# Patient Record
Sex: Male | Born: 1945 | Race: White | Hispanic: No | Marital: Married | Smoking: Current every day smoker
Health system: Southern US, Community
[De-identification: ages and names within clinical notes are randomized; demographics above are authoritative.]

## PROBLEM LIST (undated history)

## (undated) DIAGNOSIS — I219 Acute myocardial infarction, unspecified: Secondary | ICD-10-CM

## (undated) DIAGNOSIS — M1990 Unspecified osteoarthritis, unspecified site: Secondary | ICD-10-CM

## (undated) DIAGNOSIS — E785 Hyperlipidemia, unspecified: Secondary | ICD-10-CM

## (undated) DIAGNOSIS — R1310 Dysphagia, unspecified: Secondary | ICD-10-CM

## (undated) DIAGNOSIS — F32A Depression, unspecified: Secondary | ICD-10-CM

## (undated) DIAGNOSIS — I2510 Atherosclerotic heart disease of native coronary artery without angina pectoris: Secondary | ICD-10-CM

## (undated) DIAGNOSIS — IMO0001 Reserved for inherently not codable concepts without codable children: Secondary | ICD-10-CM

## (undated) DIAGNOSIS — I10 Essential (primary) hypertension: Secondary | ICD-10-CM

## (undated) DIAGNOSIS — J449 Chronic obstructive pulmonary disease, unspecified: Secondary | ICD-10-CM

## (undated) DIAGNOSIS — F419 Anxiety disorder, unspecified: Secondary | ICD-10-CM

## (undated) DIAGNOSIS — R002 Palpitations: Secondary | ICD-10-CM

## (undated) DIAGNOSIS — Z794 Long term (current) use of insulin: Secondary | ICD-10-CM

## (undated) DIAGNOSIS — K219 Gastro-esophageal reflux disease without esophagitis: Secondary | ICD-10-CM

## (undated) DIAGNOSIS — S32020A Wedge compression fracture of second lumbar vertebra, initial encounter for closed fracture: Secondary | ICD-10-CM

## (undated) DIAGNOSIS — F329 Major depressive disorder, single episode, unspecified: Secondary | ICD-10-CM

## (undated) DIAGNOSIS — K8020 Calculus of gallbladder without cholecystitis without obstruction: Secondary | ICD-10-CM

## (undated) DIAGNOSIS — E119 Type 2 diabetes mellitus without complications: Secondary | ICD-10-CM

## (undated) DIAGNOSIS — I700 Atherosclerosis of aorta: Secondary | ICD-10-CM

## (undated) DIAGNOSIS — J189 Pneumonia, unspecified organism: Secondary | ICD-10-CM

## (undated) HISTORY — DX: Reserved for inherently not codable concepts without codable children: IMO0001

## (undated) HISTORY — PX: CARDIAC CATHETERIZATION: SHX172

## (undated) HISTORY — DX: Gastro-esophageal reflux disease without esophagitis: K21.9

## (undated) HISTORY — DX: Hyperlipidemia, unspecified: E78.5

## (undated) HISTORY — DX: Anxiety disorder, unspecified: F41.9

## (undated) HISTORY — DX: Essential (primary) hypertension: I10

## (undated) HISTORY — DX: Atherosclerosis of aorta: I70.0

## (undated) HISTORY — DX: Depression, unspecified: F32.A

## (undated) HISTORY — DX: Long term (current) use of insulin: Z79.4

## (undated) HISTORY — PX: CORONARY ANGIOPLASTY WITH STENT PLACEMENT: SHX49

## (undated) HISTORY — DX: Major depressive disorder, single episode, unspecified: F32.9

## (undated) HISTORY — DX: Palpitations: R00.2

## (undated) HISTORY — DX: Type 2 diabetes mellitus without complications: E11.9

---

## 1996-05-09 HISTORY — PX: CORONARY ANGIOPLASTY: SHX604

## 2006-01-17 DIAGNOSIS — R319 Hematuria, unspecified: Secondary | ICD-10-CM | POA: Insufficient documentation

## 2006-01-17 DIAGNOSIS — E785 Hyperlipidemia, unspecified: Secondary | ICD-10-CM | POA: Insufficient documentation

## 2006-01-17 DIAGNOSIS — I2510 Atherosclerotic heart disease of native coronary artery without angina pectoris: Secondary | ICD-10-CM | POA: Insufficient documentation

## 2006-01-17 DIAGNOSIS — I10 Essential (primary) hypertension: Secondary | ICD-10-CM | POA: Insufficient documentation

## 2006-01-17 DIAGNOSIS — R002 Palpitations: Secondary | ICD-10-CM | POA: Insufficient documentation

## 2010-06-04 DIAGNOSIS — Z7189 Other specified counseling: Secondary | ICD-10-CM | POA: Insufficient documentation

## 2010-06-29 DIAGNOSIS — Z955 Presence of coronary angioplasty implant and graft: Secondary | ICD-10-CM | POA: Insufficient documentation

## 2010-06-29 DIAGNOSIS — I252 Old myocardial infarction: Secondary | ICD-10-CM | POA: Insufficient documentation

## 2010-07-27 DIAGNOSIS — F419 Anxiety disorder, unspecified: Secondary | ICD-10-CM | POA: Insufficient documentation

## 2011-03-28 DIAGNOSIS — I249 Acute ischemic heart disease, unspecified: Secondary | ICD-10-CM | POA: Insufficient documentation

## 2011-04-01 DIAGNOSIS — Z7189 Other specified counseling: Secondary | ICD-10-CM | POA: Insufficient documentation

## 2011-05-18 DIAGNOSIS — R1310 Dysphagia, unspecified: Secondary | ICD-10-CM | POA: Diagnosis not present

## 2011-07-08 DIAGNOSIS — Z7189 Other specified counseling: Secondary | ICD-10-CM | POA: Diagnosis not present

## 2011-07-26 DIAGNOSIS — M25569 Pain in unspecified knee: Secondary | ICD-10-CM | POA: Diagnosis not present

## 2011-07-28 DIAGNOSIS — E785 Hyperlipidemia, unspecified: Secondary | ICD-10-CM | POA: Diagnosis not present

## 2011-07-28 DIAGNOSIS — E119 Type 2 diabetes mellitus without complications: Secondary | ICD-10-CM | POA: Diagnosis not present

## 2011-07-28 DIAGNOSIS — I10 Essential (primary) hypertension: Secondary | ICD-10-CM | POA: Diagnosis not present

## 2011-08-02 DIAGNOSIS — R059 Cough, unspecified: Secondary | ICD-10-CM | POA: Diagnosis not present

## 2011-08-02 DIAGNOSIS — I10 Essential (primary) hypertension: Secondary | ICD-10-CM | POA: Diagnosis not present

## 2011-08-02 DIAGNOSIS — J320 Chronic maxillary sinusitis: Secondary | ICD-10-CM | POA: Diagnosis not present

## 2011-08-02 DIAGNOSIS — E119 Type 2 diabetes mellitus without complications: Secondary | ICD-10-CM | POA: Diagnosis not present

## 2011-08-02 DIAGNOSIS — F411 Generalized anxiety disorder: Secondary | ICD-10-CM | POA: Diagnosis not present

## 2011-08-02 DIAGNOSIS — E785 Hyperlipidemia, unspecified: Secondary | ICD-10-CM | POA: Diagnosis not present

## 2011-08-02 DIAGNOSIS — R05 Cough: Secondary | ICD-10-CM | POA: Diagnosis not present

## 2011-08-08 DIAGNOSIS — Z7189 Other specified counseling: Secondary | ICD-10-CM | POA: Diagnosis not present

## 2011-09-01 DIAGNOSIS — R002 Palpitations: Secondary | ICD-10-CM | POA: Diagnosis not present

## 2011-09-01 DIAGNOSIS — E785 Hyperlipidemia, unspecified: Secondary | ICD-10-CM | POA: Diagnosis not present

## 2011-09-01 DIAGNOSIS — I2510 Atherosclerotic heart disease of native coronary artery without angina pectoris: Secondary | ICD-10-CM | POA: Diagnosis not present

## 2011-09-01 DIAGNOSIS — I2119 ST elevation (STEMI) myocardial infarction involving other coronary artery of inferior wall: Secondary | ICD-10-CM | POA: Diagnosis not present

## 2011-09-07 DIAGNOSIS — Z7189 Other specified counseling: Secondary | ICD-10-CM | POA: Diagnosis not present

## 2011-09-19 DIAGNOSIS — I2510 Atherosclerotic heart disease of native coronary artery without angina pectoris: Secondary | ICD-10-CM | POA: Diagnosis not present

## 2011-10-08 DIAGNOSIS — Z7189 Other specified counseling: Secondary | ICD-10-CM | POA: Diagnosis not present

## 2011-12-06 DIAGNOSIS — E119 Type 2 diabetes mellitus without complications: Secondary | ICD-10-CM | POA: Diagnosis not present

## 2011-12-07 DIAGNOSIS — E119 Type 2 diabetes mellitus without complications: Secondary | ICD-10-CM | POA: Diagnosis not present

## 2011-12-07 DIAGNOSIS — J320 Chronic maxillary sinusitis: Secondary | ICD-10-CM | POA: Diagnosis not present

## 2012-01-08 DIAGNOSIS — Z7189 Other specified counseling: Secondary | ICD-10-CM | POA: Diagnosis not present

## 2012-02-14 DIAGNOSIS — R002 Palpitations: Secondary | ICD-10-CM | POA: Diagnosis not present

## 2012-02-14 DIAGNOSIS — I2119 ST elevation (STEMI) myocardial infarction involving other coronary artery of inferior wall: Secondary | ICD-10-CM | POA: Diagnosis not present

## 2012-02-14 DIAGNOSIS — I2510 Atherosclerotic heart disease of native coronary artery without angina pectoris: Secondary | ICD-10-CM | POA: Diagnosis not present

## 2012-02-14 DIAGNOSIS — E785 Hyperlipidemia, unspecified: Secondary | ICD-10-CM | POA: Diagnosis not present

## 2012-02-16 DIAGNOSIS — R5381 Other malaise: Secondary | ICD-10-CM | POA: Diagnosis not present

## 2012-02-16 DIAGNOSIS — R5383 Other fatigue: Secondary | ICD-10-CM | POA: Diagnosis not present

## 2012-03-06 DIAGNOSIS — E785 Hyperlipidemia, unspecified: Secondary | ICD-10-CM | POA: Diagnosis not present

## 2012-03-06 DIAGNOSIS — R059 Cough, unspecified: Secondary | ICD-10-CM | POA: Diagnosis not present

## 2012-03-06 DIAGNOSIS — E119 Type 2 diabetes mellitus without complications: Secondary | ICD-10-CM | POA: Diagnosis not present

## 2012-03-06 DIAGNOSIS — R05 Cough: Secondary | ICD-10-CM | POA: Diagnosis not present

## 2012-03-09 DIAGNOSIS — R05 Cough: Secondary | ICD-10-CM | POA: Diagnosis not present

## 2012-03-09 DIAGNOSIS — F4321 Adjustment disorder with depressed mood: Secondary | ICD-10-CM | POA: Diagnosis not present

## 2012-03-09 DIAGNOSIS — E785 Hyperlipidemia, unspecified: Secondary | ICD-10-CM | POA: Diagnosis not present

## 2012-03-09 DIAGNOSIS — E119 Type 2 diabetes mellitus without complications: Secondary | ICD-10-CM | POA: Diagnosis not present

## 2012-03-09 DIAGNOSIS — I10 Essential (primary) hypertension: Secondary | ICD-10-CM | POA: Diagnosis not present

## 2012-03-09 DIAGNOSIS — R059 Cough, unspecified: Secondary | ICD-10-CM | POA: Diagnosis not present

## 2012-04-04 DIAGNOSIS — R05 Cough: Secondary | ICD-10-CM | POA: Diagnosis not present

## 2012-04-04 DIAGNOSIS — R059 Cough, unspecified: Secondary | ICD-10-CM | POA: Diagnosis not present

## 2012-04-16 DIAGNOSIS — I252 Old myocardial infarction: Secondary | ICD-10-CM | POA: Diagnosis not present

## 2012-04-16 DIAGNOSIS — I10 Essential (primary) hypertension: Secondary | ICD-10-CM | POA: Diagnosis not present

## 2012-04-16 DIAGNOSIS — E785 Hyperlipidemia, unspecified: Secondary | ICD-10-CM | POA: Diagnosis not present

## 2012-04-16 DIAGNOSIS — I2510 Atherosclerotic heart disease of native coronary artery without angina pectoris: Secondary | ICD-10-CM | POA: Diagnosis not present

## 2012-04-18 DIAGNOSIS — I10 Essential (primary) hypertension: Secondary | ICD-10-CM | POA: Diagnosis not present

## 2012-04-18 DIAGNOSIS — R05 Cough: Secondary | ICD-10-CM | POA: Diagnosis not present

## 2012-04-18 DIAGNOSIS — R059 Cough, unspecified: Secondary | ICD-10-CM | POA: Diagnosis not present

## 2012-04-24 DIAGNOSIS — J438 Other emphysema: Secondary | ICD-10-CM | POA: Diagnosis not present

## 2012-04-24 DIAGNOSIS — F17200 Nicotine dependence, unspecified, uncomplicated: Secondary | ICD-10-CM | POA: Diagnosis not present

## 2012-04-24 DIAGNOSIS — I10 Essential (primary) hypertension: Secondary | ICD-10-CM | POA: Diagnosis not present

## 2012-04-24 DIAGNOSIS — Z9861 Coronary angioplasty status: Secondary | ICD-10-CM | POA: Diagnosis not present

## 2012-04-24 DIAGNOSIS — K219 Gastro-esophageal reflux disease without esophagitis: Secondary | ICD-10-CM | POA: Diagnosis not present

## 2012-04-24 DIAGNOSIS — E119 Type 2 diabetes mellitus without complications: Secondary | ICD-10-CM | POA: Diagnosis not present

## 2012-04-24 DIAGNOSIS — R079 Chest pain, unspecified: Secondary | ICD-10-CM | POA: Diagnosis not present

## 2012-04-24 DIAGNOSIS — R110 Nausea: Secondary | ICD-10-CM | POA: Diagnosis not present

## 2012-04-24 DIAGNOSIS — R05 Cough: Secondary | ICD-10-CM | POA: Diagnosis not present

## 2012-04-24 DIAGNOSIS — Z7901 Long term (current) use of anticoagulants: Secondary | ICD-10-CM | POA: Diagnosis not present

## 2012-04-24 DIAGNOSIS — R059 Cough, unspecified: Secondary | ICD-10-CM | POA: Diagnosis not present

## 2012-04-24 DIAGNOSIS — R0602 Shortness of breath: Secondary | ICD-10-CM | POA: Diagnosis not present

## 2012-04-25 DIAGNOSIS — E785 Hyperlipidemia, unspecified: Secondary | ICD-10-CM | POA: Diagnosis present

## 2012-04-25 DIAGNOSIS — K219 Gastro-esophageal reflux disease without esophagitis: Secondary | ICD-10-CM | POA: Diagnosis present

## 2012-04-25 DIAGNOSIS — R0602 Shortness of breath: Secondary | ICD-10-CM | POA: Diagnosis not present

## 2012-04-25 DIAGNOSIS — J449 Chronic obstructive pulmonary disease, unspecified: Secondary | ICD-10-CM | POA: Diagnosis present

## 2012-04-25 DIAGNOSIS — F17200 Nicotine dependence, unspecified, uncomplicated: Secondary | ICD-10-CM | POA: Diagnosis present

## 2012-04-25 DIAGNOSIS — R079 Chest pain, unspecified: Secondary | ICD-10-CM | POA: Diagnosis not present

## 2012-04-25 DIAGNOSIS — E119 Type 2 diabetes mellitus without complications: Secondary | ICD-10-CM | POA: Diagnosis present

## 2012-04-25 DIAGNOSIS — I10 Essential (primary) hypertension: Secondary | ICD-10-CM | POA: Diagnosis present

## 2012-04-25 DIAGNOSIS — J069 Acute upper respiratory infection, unspecified: Secondary | ICD-10-CM | POA: Diagnosis present

## 2012-04-25 DIAGNOSIS — R05 Cough: Secondary | ICD-10-CM | POA: Diagnosis not present

## 2012-04-25 DIAGNOSIS — I2510 Atherosclerotic heart disease of native coronary artery without angina pectoris: Secondary | ICD-10-CM | POA: Diagnosis not present

## 2012-04-25 DIAGNOSIS — R059 Cough, unspecified: Secondary | ICD-10-CM | POA: Diagnosis not present

## 2012-04-25 DIAGNOSIS — I252 Old myocardial infarction: Secondary | ICD-10-CM | POA: Diagnosis not present

## 2012-04-25 DIAGNOSIS — Z9861 Coronary angioplasty status: Secondary | ICD-10-CM | POA: Diagnosis not present

## 2012-04-25 DIAGNOSIS — E669 Obesity, unspecified: Secondary | ICD-10-CM | POA: Diagnosis present

## 2012-04-25 DIAGNOSIS — I209 Angina pectoris, unspecified: Secondary | ICD-10-CM | POA: Diagnosis not present

## 2012-04-25 DIAGNOSIS — I200 Unstable angina: Secondary | ICD-10-CM | POA: Diagnosis not present

## 2012-04-25 DIAGNOSIS — R110 Nausea: Secondary | ICD-10-CM | POA: Diagnosis not present

## 2012-04-26 DIAGNOSIS — I2510 Atherosclerotic heart disease of native coronary artery without angina pectoris: Secondary | ICD-10-CM | POA: Insufficient documentation

## 2012-04-30 DIAGNOSIS — J438 Other emphysema: Secondary | ICD-10-CM | POA: Diagnosis not present

## 2012-04-30 DIAGNOSIS — Z9861 Coronary angioplasty status: Secondary | ICD-10-CM | POA: Diagnosis not present

## 2012-04-30 DIAGNOSIS — E119 Type 2 diabetes mellitus without complications: Secondary | ICD-10-CM | POA: Diagnosis not present

## 2012-06-05 DIAGNOSIS — R002 Palpitations: Secondary | ICD-10-CM | POA: Diagnosis not present

## 2012-06-05 DIAGNOSIS — I2119 ST elevation (STEMI) myocardial infarction involving other coronary artery of inferior wall: Secondary | ICD-10-CM | POA: Diagnosis not present

## 2012-06-05 DIAGNOSIS — I2510 Atherosclerotic heart disease of native coronary artery without angina pectoris: Secondary | ICD-10-CM | POA: Diagnosis not present

## 2012-06-05 DIAGNOSIS — E785 Hyperlipidemia, unspecified: Secondary | ICD-10-CM | POA: Diagnosis not present

## 2012-08-02 DIAGNOSIS — I2510 Atherosclerotic heart disease of native coronary artery without angina pectoris: Secondary | ICD-10-CM | POA: Diagnosis not present

## 2012-08-02 DIAGNOSIS — J069 Acute upper respiratory infection, unspecified: Secondary | ICD-10-CM | POA: Diagnosis not present

## 2012-08-02 DIAGNOSIS — R079 Chest pain, unspecified: Secondary | ICD-10-CM | POA: Diagnosis not present

## 2012-08-02 DIAGNOSIS — Z95818 Presence of other cardiac implants and grafts: Secondary | ICD-10-CM | POA: Diagnosis not present

## 2012-08-02 DIAGNOSIS — E785 Hyperlipidemia, unspecified: Secondary | ICD-10-CM | POA: Diagnosis not present

## 2012-08-02 DIAGNOSIS — E119 Type 2 diabetes mellitus without complications: Secondary | ICD-10-CM | POA: Diagnosis not present

## 2012-08-02 DIAGNOSIS — R0602 Shortness of breath: Secondary | ICD-10-CM | POA: Diagnosis not present

## 2012-08-02 DIAGNOSIS — I10 Essential (primary) hypertension: Secondary | ICD-10-CM | POA: Diagnosis not present

## 2012-08-02 DIAGNOSIS — R0789 Other chest pain: Secondary | ICD-10-CM | POA: Diagnosis not present

## 2012-08-03 DIAGNOSIS — R0789 Other chest pain: Secondary | ICD-10-CM | POA: Diagnosis not present

## 2012-08-03 DIAGNOSIS — E119 Type 2 diabetes mellitus without complications: Secondary | ICD-10-CM | POA: Diagnosis not present

## 2012-08-03 DIAGNOSIS — I2510 Atherosclerotic heart disease of native coronary artery without angina pectoris: Secondary | ICD-10-CM | POA: Diagnosis not present

## 2012-08-03 DIAGNOSIS — R079 Chest pain, unspecified: Secondary | ICD-10-CM | POA: Insufficient documentation

## 2012-08-03 DIAGNOSIS — I10 Essential (primary) hypertension: Secondary | ICD-10-CM | POA: Diagnosis not present

## 2012-08-03 DIAGNOSIS — J069 Acute upper respiratory infection, unspecified: Secondary | ICD-10-CM | POA: Diagnosis not present

## 2012-08-07 DIAGNOSIS — I2510 Atherosclerotic heart disease of native coronary artery without angina pectoris: Secondary | ICD-10-CM | POA: Diagnosis not present

## 2012-08-07 DIAGNOSIS — I10 Essential (primary) hypertension: Secondary | ICD-10-CM | POA: Diagnosis not present

## 2012-08-07 DIAGNOSIS — Z9861 Coronary angioplasty status: Secondary | ICD-10-CM | POA: Diagnosis not present

## 2012-08-13 DIAGNOSIS — E785 Hyperlipidemia, unspecified: Secondary | ICD-10-CM | POA: Diagnosis not present

## 2012-08-13 DIAGNOSIS — I2510 Atherosclerotic heart disease of native coronary artery without angina pectoris: Secondary | ICD-10-CM | POA: Diagnosis not present

## 2012-08-13 DIAGNOSIS — I10 Essential (primary) hypertension: Secondary | ICD-10-CM | POA: Diagnosis not present

## 2012-08-23 DIAGNOSIS — I2510 Atherosclerotic heart disease of native coronary artery without angina pectoris: Secondary | ICD-10-CM | POA: Diagnosis not present

## 2012-08-23 DIAGNOSIS — I517 Cardiomegaly: Secondary | ICD-10-CM | POA: Diagnosis not present

## 2012-09-04 DIAGNOSIS — E785 Hyperlipidemia, unspecified: Secondary | ICD-10-CM | POA: Diagnosis not present

## 2012-09-04 DIAGNOSIS — I2510 Atherosclerotic heart disease of native coronary artery without angina pectoris: Secondary | ICD-10-CM | POA: Diagnosis not present

## 2012-09-04 DIAGNOSIS — F17200 Nicotine dependence, unspecified, uncomplicated: Secondary | ICD-10-CM | POA: Diagnosis not present

## 2012-09-25 DIAGNOSIS — E119 Type 2 diabetes mellitus without complications: Secondary | ICD-10-CM | POA: Diagnosis not present

## 2012-10-03 DIAGNOSIS — E785 Hyperlipidemia, unspecified: Secondary | ICD-10-CM | POA: Diagnosis not present

## 2012-10-03 DIAGNOSIS — I10 Essential (primary) hypertension: Secondary | ICD-10-CM | POA: Diagnosis not present

## 2012-10-03 DIAGNOSIS — E119 Type 2 diabetes mellitus without complications: Secondary | ICD-10-CM | POA: Diagnosis not present

## 2012-10-10 DIAGNOSIS — I2510 Atherosclerotic heart disease of native coronary artery without angina pectoris: Secondary | ICD-10-CM | POA: Diagnosis not present

## 2012-10-10 DIAGNOSIS — I10 Essential (primary) hypertension: Secondary | ICD-10-CM | POA: Diagnosis not present

## 2012-10-10 DIAGNOSIS — F17200 Nicotine dependence, unspecified, uncomplicated: Secondary | ICD-10-CM | POA: Diagnosis not present

## 2012-10-10 DIAGNOSIS — E785 Hyperlipidemia, unspecified: Secondary | ICD-10-CM | POA: Diagnosis not present

## 2012-10-29 DIAGNOSIS — H5210 Myopia, unspecified eye: Secondary | ICD-10-CM | POA: Diagnosis not present

## 2012-10-29 DIAGNOSIS — H259 Unspecified age-related cataract: Secondary | ICD-10-CM | POA: Diagnosis not present

## 2012-10-29 DIAGNOSIS — H524 Presbyopia: Secondary | ICD-10-CM | POA: Diagnosis not present

## 2012-10-29 DIAGNOSIS — E109 Type 1 diabetes mellitus without complications: Secondary | ICD-10-CM | POA: Diagnosis not present

## 2012-12-07 DIAGNOSIS — E119 Type 2 diabetes mellitus without complications: Secondary | ICD-10-CM | POA: Diagnosis not present

## 2012-12-14 DIAGNOSIS — E119 Type 2 diabetes mellitus without complications: Secondary | ICD-10-CM | POA: Diagnosis not present

## 2013-01-30 ENCOUNTER — Encounter: Payer: Self-pay | Admitting: Cardiology

## 2013-01-30 ENCOUNTER — Ambulatory Visit (INDEPENDENT_AMBULATORY_CARE_PROVIDER_SITE_OTHER): Payer: Medicare Other | Admitting: Cardiology

## 2013-01-30 VITALS — BP 98/62 | HR 62 | Ht 70.0 in | Wt 277.6 lb

## 2013-01-30 DIAGNOSIS — IMO0001 Reserved for inherently not codable concepts without codable children: Secondary | ICD-10-CM

## 2013-01-30 DIAGNOSIS — I25810 Atherosclerosis of coronary artery bypass graft(s) without angina pectoris: Secondary | ICD-10-CM | POA: Diagnosis not present

## 2013-01-30 DIAGNOSIS — E785 Hyperlipidemia, unspecified: Secondary | ICD-10-CM | POA: Diagnosis not present

## 2013-01-30 DIAGNOSIS — F17200 Nicotine dependence, unspecified, uncomplicated: Secondary | ICD-10-CM

## 2013-01-30 DIAGNOSIS — Z794 Long term (current) use of insulin: Secondary | ICD-10-CM

## 2013-01-30 DIAGNOSIS — I10 Essential (primary) hypertension: Secondary | ICD-10-CM | POA: Diagnosis not present

## 2013-01-30 DIAGNOSIS — Z760 Encounter for issue of repeat prescription: Secondary | ICD-10-CM

## 2013-01-30 DIAGNOSIS — E119 Type 2 diabetes mellitus without complications: Secondary | ICD-10-CM

## 2013-01-30 DIAGNOSIS — I2510 Atherosclerotic heart disease of native coronary artery without angina pectoris: Secondary | ICD-10-CM | POA: Insufficient documentation

## 2013-01-30 MED ORDER — INSULIN GLARGINE 100 UNIT/ML ~~LOC~~ SOLN: 14.0000 [IU] | Freq: Every day | SUBCUTANEOUS | Status: DC

## 2013-01-30 MED ORDER — CLONAZEPAM 0.5 MG PO TABS: 0.5000 mg | ORAL_TABLET | Freq: Two times a day (BID) | ORAL | Status: DC | PRN

## 2013-01-30 NOTE — Progress Notes (Signed)
Patient ID: Dwayne [name], male   DOB: 28-Sep-1945, 67 y.o.   MRN: [id number]    Patient Name: Alford Gamero Date of Encounter: 01/30/2013  Primary Care Provider:  No primary provider on file. Primary Cardiologist:  Tobias Alexander, H  Patient Profile  Establishing cardiology care after move from [place]  Problem List   Past Medical History  Diagnosis Date  . Palpitations   . Hypertension     Benign  . Hyperlipidemia   . Reflux   . Diabetes mellitus without complication     Type 2  . Depression   . 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 Anxiety    Past Surgical History  Procedure Laterality Date  . Angioplasty    . Carotid stent insertion      Allergies  No Known Allergies  HPI  67 year old male with h/o IDDM, obesity, hypertension, hyperlipidemia, ongoing smoking and CAD who just moved to [facility] and is here to establish cardiology care. 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 He was doing well until 2012 when he had another episode chest pain and received stent to RCA another the next day for in stent restenosis. He was sterted on Prasugrel after that. He describes his pain as reflux but stronger. He states that since the last stent he has been asymptomatic. He is trying to exercise daily and has CP or SOB.    Home Medications  Prior to Admission medications   Medication Sig Start Date End Date Taking? Authorizing Provider  albuterol (PROVENTIL) (2.5 MG/3ML) 0.083% nebulizer solution Take 2.5 mg by nebulization every 6 (six) hours as needed for wheezing.   Yes Historical Provider, MD  atorvastatin (LIPITOR) 80 MG tablet Take 80 mg by mouth daily.   Yes Historical Provider, MD  clonazePAM (KLONOPIN) 0.5 MG tablet Take 0.5 mg by mouth 2 (two) times daily as needed for anxiety.   Yes Historical Provider, MD  GLIPIZIDE ER PO Take 10 mg by mouth 2 (two) times daily.   Yes Historical Provider, MD    hydrochlorothiazide (HYDRODIURIL) 25 MG tablet Take 25 mg by mouth daily.   Yes Historical Provider, MD  Insulin Glargine (LANTUS [place]) Inject 14 Units into the skin daily.   Yes Historical Provider, MD  lisinopril (PRINIVIL,ZESTRIL) 40 MG tablet Take 40 mg by mouth daily.   Yes Historical Provider, MD  metFORMIN (GLUCOPHAGE) 1000 MG tablet Take 1,000 mg by mouth 2 (two) times daily with a meal.   Yes Historical Provider, MD  metoprolol (LOPRESSOR) 50 MG tablet Take 50 mg by mouth 2 (two) times daily.   Yes Historical Provider, MD  nitroGLYCERIN (NITROSTAT) 0.4 MG SL tablet Place 0.4 mg under the tongue every 5 (five) minutes as needed for chest pain.   Yes Historical Provider, MD  prasugrel (EFFIENT) 10 MG TABS tablet Take 10 mg by mouth daily.   Yes Historical Provider, MD  sertraline (ZOLOFT) 100 MG tablet Take 100 mg by mouth daily.   Yes Historical Provider, MD    Family History  No family history on file.  Social History  History   Social History  . Marital Status: Married    Spouse Name: N/A    Number of Children: N/A  . Years of Education: N/A   Occupational History  . Not on file.   Social History Main Topics  . Smoking status: Current Every  Day Smoker  . Smokeless tobacco: Not on file  . Alcohol Use: Not on file  . Drug Use: Not on file  . Sexual Activity: Not on file   Other Topics Concern  . Not on file   Social History Narrative  . No narrative on file     Review of Systems General:  No chills, fever, night sweats or weight changes.  Cardiovascular:  No chest pain, dyspnea on exertion, edema, orthopnea, palpitations, paroxysmal nocturnal dyspnea. Dermatological: No rash, lesions/masses Respiratory: No cough, dyspnea Urologic: No hematuria, dysuria Abdominal:   No nausea, vomiting, diarrhea, bright red blood per rectum, melena, or hematemesis Neurologic:  No visual changes, wkns, changes in mental status. All other systems reviewed and are otherwise  negative except as noted above.  Physical Exam  Height 5\' 10"  (1.778 m), weight 277 lb 9.6 oz (125.919 kg).  General: Pleasant, NAD, obese Psych: Normal affect. Neuro: Alert and oriented X 3. Moves all extremities spontaneously. HEENT: Normal  Neck: Supple without bruits or JVD. Lungs:  Barrel chest, Resp regular and unlabored, CTA.  Heart: RRR no s3, s4, or murmurs. Abdomen: Soft, non-tender, non-distended, BS + x 4. No bruit above AA  Extremities: No clubbing, cyanosis or edema. DP/PT/Radials 2+ and equal bilaterally.  Accessory Clinical Findings  ECG - SR, 1. AVB, old inferior MI  Assessment & Plan  67 year old male  1. CAD, S/P multiple stening including RCA, diagomal, currently on dual antiplatelet therapy with ASA and prasugrel, we will continue for as long as possible. The patient is asymptomatic, no need for any further intervention at this point. Continue BB, ACE, statin  2. IDDM - the patient need a referral to an endocrinologist in the area, we will refer to [facility] endocrinology  3. Hypertension - well controlled, no change in regimen   4. Hyperlipidemia - well controlled on atorvastatin 80 mh QHS, last values in 08/2012 LDL 45, HDL 45, TAG 119, recheck in 6 months  Refills for insulin and clonazepam given as he doesn't have a PCP or endocrinologist yet.   Follow up in 6 months with lipid profile.  Tobias Alexander, Rexene Edison, MD 01/30/2013, 4:05 PM

## 2013-01-30 NOTE — Patient Instructions (Addendum)
Your physician recommends that you schedule a follow-up appointment in: 6 months with Dr. Johnell Comings have been referred to a [place] endrocrinologist  Your physician recommends that you continue on your current medications as directed. Please refer to the Current Medication list given to you today.

## 2013-01-31 ENCOUNTER — Telehealth: Payer: Self-pay | Admitting: *Deleted

## 2013-01-31 MED ORDER — INSULIN GLARGINE 100 UNIT/ML SOLOSTAR PEN: 14.0000 [IU] | PEN_INJECTOR | Freq: Every day | SUBCUTANEOUS | Status: DC

## 2013-01-31 NOTE — Telephone Encounter (Signed)
Called regarding insulin, uses pen instead of vial

## 2013-02-15 ENCOUNTER — Other Ambulatory Visit: Payer: Self-pay

## 2013-02-15 MED ORDER — METOPROLOL TARTRATE 50 MG PO TABS: 50.0000 mg | ORAL_TABLET | Freq: Two times a day (BID) | ORAL | Status: DC

## 2013-02-19 ENCOUNTER — Telehealth: Payer: Self-pay

## 2013-02-19 MED ORDER — METOPROLOL SUCCINATE ER 50 MG PO TB24: 50.0000 mg | ORAL_TABLET | Freq: Every day | ORAL | Status: DC

## 2013-02-19 NOTE — Telephone Encounter (Signed)
Message copied by Carmela Hurt on Tue Feb 19, 2013  4:36 PM ------      Message from: Lars Masson      Created: Tue Feb 19, 2013  4:28 PM      Regarding: RE: med change       Yes, that would be ok with me.      Tobias Alexander            ----- Message -----         From: Carmela Hurt, RN         Sent: 02/19/2013   3:46 PM           To: Lars Masson, MD, Carmela Hurt, RN      Subject: med change                                               Dr Delton See, patient has called in requesting his metoprolol be change to long acting ER 50 mg, can we do this?            Thanks, Addison Lank, RN Patient Care Advocate Team       ------

## 2013-02-19 NOTE — Telephone Encounter (Signed)
Not to Dr Delton See regarding changing to long acting metotoprol

## 2013-02-25 ENCOUNTER — Ambulatory Visit (INDEPENDENT_AMBULATORY_CARE_PROVIDER_SITE_OTHER): Payer: Medicare Other | Admitting: Endocrinology

## 2013-02-25 ENCOUNTER — Encounter: Payer: Self-pay | Admitting: Endocrinology

## 2013-02-25 VITALS — BP 110/68 | HR 73 | Temp 97.8°F | Ht 70.0 in | Wt 277.8 lb

## 2013-02-25 DIAGNOSIS — F32A Depression, unspecified: Secondary | ICD-10-CM | POA: Insufficient documentation

## 2013-02-25 DIAGNOSIS — F329 Major depressive disorder, single episode, unspecified: Secondary | ICD-10-CM

## 2013-02-25 DIAGNOSIS — Z23 Encounter for immunization: Secondary | ICD-10-CM | POA: Diagnosis not present

## 2013-02-25 DIAGNOSIS — F3289 Other specified depressive episodes: Secondary | ICD-10-CM | POA: Diagnosis not present

## 2013-02-25 NOTE — Progress Notes (Signed)
Subjective:    Patient ID: Dwayne [name], male    DOB: 10-19-1945, 67 y.o.   MRN: [id number]  HPI pt states DM was dx'ed in 2006; he has moderate neuropathy of the lower extremities, and associated CAD.  he has been on insulin in mid-2014.  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 Past Medical History  Diagnosis Date  . Palpitations   . Hypertension     Benign  . Hyperlipidemia   . Reflux   . Diabetes mellitus without complication     Type 2  . Depression   . 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 Anxiety     Past Surgical History  Procedure Laterality Date  . Angioplasty    . Carotid stent insertion      History   Social History  . Marital Status: Married    Spouse Name: N/A    Number of Children: N/A  . Years of Education: N/A   Occupational History  . Not on file.   Social History Main Topics  . Smoking status: Current Every Day Smoker  . Smokeless tobacco: Not on file  . Alcohol Use: Not on file  . Drug Use: Not on file  . Sexual Activity: Not on file   Other Topics Concern  . Not on file   Social History Narrative  . No narrative on file    Current Outpatient Prescriptions on File Prior to Visit  Medication Sig Dispense Refill  . albuterol (PROVENTIL) (2.5 MG/3ML) 0.083% nebulizer solution Take 2.5 mg by nebulization every 6 (six) hours as needed for wheezing.      Marland Kitchen atorvastatin (LIPITOR) 80 MG tablet Take 80 mg by mouth daily.      . clonazePAM (KLONOPIN) 0.5 MG tablet Take 1 tablet (0.5 mg total) by mouth 2 (two) times daily as needed for anxiety.  30 tablet  2  . GLIPIZIDE ER PO Take 10 mg by mouth 2 (two) times daily.      . hydrochlorothiazide (HYDRODIURIL) 25 MG tablet Take 25 mg by mouth daily.      . Insulin Glargine (LANTUS [place]) 100 UNIT/ML SOPN Inject 14 Units into the skin at bedtime.  5 pen  6  . lisinopril (PRINIVIL,ZESTRIL) 40 MG tablet Take 40 mg by mouth daily.      . metFORMIN (GLUCOPHAGE) 1000 MG tablet Take 1,000 mg by  mouth 2 (two) times daily with a meal.      . metoprolol (LOPRESSOR) 50 MG tablet Take 1 tablet (50 mg total) by mouth 2 (two) times daily.  60 tablet  6  . metoprolol succinate (TOPROL-XL) 50 MG 24 hr tablet Take 1 tablet (50 mg total) by mouth daily.  30 tablet  6  . nitroGLYCERIN (NITROSTAT) 0.4 MG SL tablet Place 0.4 mg under the tongue every 5 (five) minutes as needed for chest pain.      . prasugrel (EFFIENT) 10 MG TABS tablet Take 10 mg by mouth daily.      . sertraline (ZOLOFT) 100 MG tablet Take 100 mg by mouth daily.       No current facility-administered medications on file prior to visit.   No Known Allergies  No family history on file. DM: father BP 110/68  Pulse 73  Temp(Src) 97.8 F (36.6 C) (Oral)  Ht 5\' 10"  (1.778 m)  Wt 277 lb 12.8 oz (126.009 kg)  BMI 39.86 kg/m2  SpO2 93%  Review of Systems  denies blurry vision, headache, chest pain, sob, n/v, urinary frequency, cramps, excessive diaphoresis, memory loss, depression, hypoglycemia, and rhinorrhea.  He has lost weight, due to his efforts.  He attributes easy bruising to effient.     Objective:   Physical Exam VS: see vs page GEN: no distress HEAD: head: no deformity eyes: no periorbital swelling, no proptosis external nose and ears are normal mouth: no lesion seen NECK: supple, thyroid is not enlarged CHEST WALL: no deformity LUNGS: clear to auscultation BREASTS:  No gynecomastia CV: reg rate and rhythm, no murmur ABD: abdomen is soft, nontender.  no hepatosplenomegaly.  not distended.  no hernia MUSCULOSKELETAL: muscle bulk and strength are grossly normal.  no obvious joint swelling.  gait is normal and steady PULSES: no carotid bruit NEURO:  cn 2-12 grossly intact.   readily moves all 4's.   SKIN:  Normal texture and temperature.  No rash or suspicious lesion is visible.   NODES:  None palpable at the neck PSYCH: alert, oriented x3.  Does not appear anxious nor depressed.   outside test results are  reviewed: A1c=7.0    Assessment & Plan:  Type 2 DM: it looks like he can a trial off insulin CAD: in this setting, he should avoid hypoglycemia Neuropathy: this limits exercise rx of DM Weight loss: this helps DM control.

## 2013-02-25 NOTE — Patient Instructions (Addendum)
good diet and exercise habits significanly improve the control of your diabetes.  please let me know if you wish to be referred to a dietician.  high blood sugar is very risky to your health.  you should see an eye doctor every year.  You are at higher than average risk for pneumonia and hepatitis-B.  You should be vaccinated against both.   controlling your blood pressure and cholesterol drastically reduces the damage diabetes does to your body.  this also applies to quitting smoking.  please discuss these with your doctor.  check your blood sugar twice a day.  vary the time of day when you check, between before the 3 meals, and at bedtime.  also check if you have symptoms of your blood sugar being too high or too low.  please keep a record of the readings and bring it to your next appointment here.  You can write it on any piece of paper.  please call us sooner if your blood sugar goes below 70, or if you have a lot of readings over 200.   For now, please change the isnulin to "invokana."  Here are some samples. Call if your sugar runs high on this, so we can add "januvia."   please call [phone] [facility] cone physician referral line), to get an appointment with a new primary doctor

## 2013-03-04 ENCOUNTER — Encounter: Payer: Self-pay | Admitting: Cardiology

## 2013-03-04 ENCOUNTER — Telehealth: Payer: Self-pay | Admitting: Endocrinology

## 2013-03-04 MED ORDER — SITAGLIPTIN PHOSPHATE 100 MG PO TABS: 100.0000 mg | ORAL_TABLET | Freq: Every day | ORAL | Status: DC

## 2013-03-04 NOTE — Telephone Encounter (Signed)
Ok, i have sent a prescription to your pharmacy to add "januvia" instead.

## 2013-03-04 NOTE — Telephone Encounter (Signed)
Called and spoke with pt and pt is aware.  Pt verbalized understanding.  

## 2013-03-04 NOTE — Telephone Encounter (Signed)
Called and spoke with pt and pt states he was given a new rx to replace his insulin. After doing some research pt feels it interferes with his BP and his BP is low enough already and the medication is expensive. Pt also does not like the side effects after reading about the medication.  Pls advise.

## 2013-03-04 NOTE — Telephone Encounter (Signed)
Left a message for return call.  

## 2013-03-05 ENCOUNTER — Telehealth: Payer: Self-pay | Admitting: *Deleted

## 2013-03-05 ENCOUNTER — Encounter: Payer: Self-pay | Admitting: Endocrinology

## 2013-03-05 MED ORDER — METOPROLOL SUCCINATE ER 50 MG PO TB24: 50.0000 mg | ORAL_TABLET | Freq: Two times a day (BID) | ORAL | Status: DC

## 2013-03-05 NOTE — Telephone Encounter (Signed)
Patient clarifying his medications, he is not on metaprolol 50 mg (lopressor), he is on the toprol XLbid, he is new to our practice and we are correcting his medication.

## 2013-03-05 NOTE — Telephone Encounter (Signed)
Pls advise.  

## 2013-03-07 ENCOUNTER — Emergency Department (HOSPITAL_COMMUNITY): Payer: Medicare Other

## 2013-03-07 ENCOUNTER — Inpatient Hospital Stay (HOSPITAL_COMMUNITY)
Admission: EM | Admit: 2013-03-07 | Discharge: 2013-03-11 | DRG: 552 | Disposition: A | Discharge status: Home / Self Care | Payer: Medicare Other | Attending: Internal Medicine | Admitting: Internal Medicine

## 2013-03-07 ENCOUNTER — Encounter (HOSPITAL_COMMUNITY): Payer: Self-pay | Admitting: Emergency Medicine

## 2013-03-07 DIAGNOSIS — F411 Generalized anxiety disorder: Secondary | ICD-10-CM | POA: Diagnosis present

## 2013-03-07 DIAGNOSIS — F17200 Nicotine dependence, unspecified, uncomplicated: Secondary | ICD-10-CM | POA: Diagnosis present

## 2013-03-07 DIAGNOSIS — K219 Gastro-esophageal reflux disease without esophagitis: Secondary | ICD-10-CM | POA: Diagnosis present

## 2013-03-07 DIAGNOSIS — R42 Dizziness and giddiness: Secondary | ICD-10-CM | POA: Diagnosis not present

## 2013-03-07 DIAGNOSIS — R079 Chest pain, unspecified: Secondary | ICD-10-CM

## 2013-03-07 DIAGNOSIS — F329 Major depressive disorder, single episode, unspecified: Secondary | ICD-10-CM | POA: Diagnosis present

## 2013-03-07 DIAGNOSIS — Z794 Long term (current) use of insulin: Secondary | ICD-10-CM | POA: Diagnosis not present

## 2013-03-07 DIAGNOSIS — T40605A Adverse effect of unspecified narcotics, initial encounter: Secondary | ICD-10-CM | POA: Diagnosis present

## 2013-03-07 DIAGNOSIS — R404 Transient alteration of awareness: Secondary | ICD-10-CM | POA: Diagnosis not present

## 2013-03-07 DIAGNOSIS — R072 Precordial pain: Secondary | ICD-10-CM | POA: Diagnosis not present

## 2013-03-07 DIAGNOSIS — I10 Essential (primary) hypertension: Secondary | ICD-10-CM

## 2013-03-07 DIAGNOSIS — Z760 Encounter for issue of repeat prescription: Secondary | ICD-10-CM

## 2013-03-07 DIAGNOSIS — I517 Cardiomegaly: Secondary | ICD-10-CM | POA: Diagnosis not present

## 2013-03-07 DIAGNOSIS — E785 Hyperlipidemia, unspecified: Secondary | ICD-10-CM | POA: Diagnosis present

## 2013-03-07 DIAGNOSIS — E119 Type 2 diabetes mellitus without complications: Secondary | ICD-10-CM | POA: Diagnosis present

## 2013-03-07 DIAGNOSIS — I252 Old myocardial infarction: Secondary | ICD-10-CM | POA: Diagnosis not present

## 2013-03-07 DIAGNOSIS — S32009A Unspecified fracture of unspecified lumbar vertebra, initial encounter for closed fracture: Principal | ICD-10-CM

## 2013-03-07 DIAGNOSIS — IMO0001 Reserved for inherently not codable concepts without codable children: Secondary | ICD-10-CM

## 2013-03-07 DIAGNOSIS — W19XXXA Unspecified fall, initial encounter: Secondary | ICD-10-CM

## 2013-03-07 DIAGNOSIS — M545 Low back pain, unspecified: Secondary | ICD-10-CM | POA: Diagnosis not present

## 2013-03-07 DIAGNOSIS — S32000A Wedge compression fracture of unspecified lumbar vertebra, initial encounter for closed fracture: Secondary | ICD-10-CM

## 2013-03-07 DIAGNOSIS — Y9210 Unspecified residential institution as the place of occurrence of the external cause: Secondary | ICD-10-CM | POA: Diagnosis present

## 2013-03-07 DIAGNOSIS — I2510 Atherosclerotic heart disease of native coronary artery without angina pectoris: Secondary | ICD-10-CM | POA: Diagnosis not present

## 2013-03-07 DIAGNOSIS — T783XXA Angioneurotic edema, initial encounter: Secondary | ICD-10-CM

## 2013-03-07 DIAGNOSIS — S298XXA Other specified injuries of thorax, initial encounter: Secondary | ICD-10-CM | POA: Diagnosis not present

## 2013-03-07 DIAGNOSIS — I25810 Atherosclerosis of coronary artery bypass graft(s) without angina pectoris: Secondary | ICD-10-CM | POA: Diagnosis not present

## 2013-03-07 DIAGNOSIS — F3289 Other specified depressive episodes: Secondary | ICD-10-CM

## 2013-03-07 DIAGNOSIS — R55 Syncope and collapse: Secondary | ICD-10-CM | POA: Diagnosis not present

## 2013-03-07 LAB — POCT I-STAT TROPONIN I

## 2013-03-07 LAB — POCT I-STAT, CHEM 8
BUN: 15 mg/dL (ref 6–23)
Calcium, Ion: 1.18 mmol/L (ref 1.13–1.30)
Chloride: 99 mEq/L (ref 96–112)
Glucose, Bld: 108 mg/dL — ABNORMAL HIGH (ref 70–99)
HCT: 43 % (ref 39.0–52.0)
Potassium: 3.9 mEq/L (ref 3.5–5.1)

## 2013-03-07 LAB — CBC WITH DIFFERENTIAL/PLATELET
Basophils Absolute: 0 10*3/uL (ref 0.0–0.1)
Basophils Relative: 0 % (ref 0–1)
Eosinophils Absolute: 0.1 10*3/uL (ref 0.0–0.7)
HCT: 42.7 % (ref 39.0–52.0)
Hemoglobin: 15.2 g/dL (ref 13.0–17.0)
Lymphocytes Relative: 14 % (ref 12–46)
MCHC: 35.6 g/dL (ref 30.0–36.0)
MCV: 95.3 fL (ref 78.0–100.0)
Monocytes Absolute: 1.1 10*3/uL — ABNORMAL HIGH (ref 0.1–1.0)
Monocytes Relative: 5 % (ref 3–12)
Neutro Abs: 15.6 10*3/uL — ABNORMAL HIGH (ref 1.7–7.7)
Neutrophils Relative %: 81 % — ABNORMAL HIGH (ref 43–77)
RBC: 4.48 MIL/uL (ref 4.22–5.81)
RDW: 13.3 % (ref 11.5–15.5)
WBC: 19.4 10*3/uL — ABNORMAL HIGH (ref 4.0–10.5)

## 2013-03-07 LAB — GLUCOSE, CAPILLARY
Glucose-Capillary: 68 mg/dL — ABNORMAL LOW (ref 70–99)
Glucose-Capillary: 96 mg/dL (ref 70–99)
Glucose-Capillary: 89 mg/dL (ref 70–99)

## 2013-03-07 MED ORDER — METHYLPREDNISOLONE SODIUM SUCC 125 MG IJ SOLR
60.0000 mg | Freq: Four times a day (QID) | INTRAMUSCULAR | Status: DC
  Administered 2013-03-07 – 2013-03-08 (×2): 60 mg via INTRAVENOUS
  Filled 2013-03-07: qty 2
  Filled 2013-03-07 (×5): qty 0.96

## 2013-03-07 MED ORDER — NICOTINE 14 MG/24HR TD PT24
14.0000 mg | MEDICATED_PATCH | Freq: Every day | TRANSDERMAL | Status: DC
  Administered 2013-03-08 – 2013-03-11 (×4): 14 mg via TRANSDERMAL
  Filled 2013-03-07 (×4): qty 1

## 2013-03-07 MED ORDER — ONDANSETRON HCL 4 MG/2ML IJ SOLN: 4.0000 mg | Freq: Four times a day (QID) | INTRAMUSCULAR | Status: DC | PRN

## 2013-03-07 MED ORDER — SODIUM CHLORIDE 0.9 % IV BOLUS (SEPSIS)
1000.0000 mL | Freq: Once | INTRAVENOUS | Status: AC
  Administered 2013-03-07: 1000 mL via INTRAVENOUS

## 2013-03-07 MED ORDER — METHYLPREDNISOLONE SODIUM SUCC 125 MG IJ SOLR
125.0000 mg | Freq: Once | INTRAMUSCULAR | Status: AC
  Administered 2013-03-07: 125 mg via INTRAVENOUS
  Filled 2013-03-07: qty 2

## 2013-03-07 MED ORDER — SODIUM CHLORIDE 0.9 % IJ SOLN
3.0000 mL | Freq: Two times a day (BID) | INTRAMUSCULAR | Status: DC
  Administered 2013-03-07 – 2013-03-11 (×8): 3 mL via INTRAVENOUS

## 2013-03-07 MED ORDER — FAMOTIDINE IN NACL 20-0.9 MG/50ML-% IV SOLN
20.0000 mg | Freq: Once | INTRAVENOUS | Status: AC
  Administered 2013-03-07: 20 mg via INTRAVENOUS
  Filled 2013-03-07: qty 50

## 2013-03-07 MED ORDER — MORPHINE SULFATE 4 MG/ML IJ SOLN
4.0000 mg | Freq: Once | INTRAMUSCULAR | Status: AC
  Administered 2013-03-07: 4 mg via INTRAVENOUS
  Filled 2013-03-07: qty 1

## 2013-03-07 MED ORDER — PRASUGREL HCL 10 MG PO TABS
10.0000 mg | ORAL_TABLET | Freq: Every day | ORAL | Status: DC
  Administered 2013-03-08 – 2013-03-11 (×4): 10 mg via ORAL
  Filled 2013-03-07 (×4): qty 1

## 2013-03-07 MED ORDER — METOPROLOL SUCCINATE ER 50 MG PO TB24
50.0000 mg | ORAL_TABLET | Freq: Two times a day (BID) | ORAL | Status: DC
  Administered 2013-03-07 – 2013-03-11 (×8): 50 mg via ORAL
  Filled 2013-03-07 (×9): qty 1

## 2013-03-07 MED ORDER — INSULIN GLARGINE 100 UNIT/ML ~~LOC~~ SOLN
14.0000 [IU] | Freq: Every day | SUBCUTANEOUS | Status: DC
  Administered 2013-03-07 – 2013-03-10 (×4): 14 [IU] via SUBCUTANEOUS
  Filled 2013-03-07 (×5): qty 0.14

## 2013-03-07 MED ORDER — IPRATROPIUM BROMIDE 0.02 % IN SOLN
0.5000 mg | Freq: Four times a day (QID) | RESPIRATORY_TRACT | Status: DC
  Administered 2013-03-08 – 2013-03-09 (×6): 0.5 mg via RESPIRATORY_TRACT
  Filled 2013-03-07 (×8): qty 2.5

## 2013-03-07 MED ORDER — DIPHENHYDRAMINE HCL 50 MG/ML IJ SOLN: 12.5000 mg | Freq: Four times a day (QID) | INTRAMUSCULAR | Status: DC | PRN

## 2013-03-07 MED ORDER — ALBUTEROL SULFATE HFA 108 (90 BASE) MCG/ACT IN AERS
2.0000 | INHALATION_SPRAY | Freq: Four times a day (QID) | RESPIRATORY_TRACT | Status: DC
  Filled 2013-03-07: qty 6.7

## 2013-03-07 MED ORDER — NITROGLYCERIN 0.4 MG SL SUBL
0.4000 mg | SUBLINGUAL_TABLET | SUBLINGUAL | Status: DC | PRN
  Administered 2013-03-10 (×2): 0.4 mg via SUBLINGUAL
  Filled 2013-03-07: qty 25

## 2013-03-07 MED ORDER — DEXTROSE 50 % IV SOLN
25.0000 mL | Freq: Once | INTRAVENOUS | Status: AC
  Administered 2013-03-07: 25 mL via INTRAVENOUS
  Filled 2013-03-07: qty 50

## 2013-03-07 MED ORDER — ONDANSETRON HCL 4 MG PO TABS: 4.0000 mg | ORAL_TABLET | Freq: Four times a day (QID) | ORAL | Status: DC | PRN

## 2013-03-07 MED ORDER — FAMOTIDINE IN NACL 20-0.9 MG/50ML-% IV SOLN
20.0000 mg | Freq: Two times a day (BID) | INTRAVENOUS | Status: DC
  Administered 2013-03-08 (×2): 20 mg via INTRAVENOUS
  Filled 2013-03-07 (×3): qty 50

## 2013-03-07 MED ORDER — HYDROMORPHONE HCL PF 1 MG/ML IJ SOLN: 0.5000 mg | INTRAMUSCULAR | Status: DC | PRN

## 2013-03-07 MED ORDER — SERTRALINE HCL 50 MG PO TABS
150.0000 mg | ORAL_TABLET | Freq: Every day | ORAL | Status: DC
  Administered 2013-03-08 – 2013-03-11 (×4): 150 mg via ORAL
  Filled 2013-03-07 (×4): qty 1

## 2013-03-07 MED ORDER — DIPHENHYDRAMINE HCL 50 MG/ML IJ SOLN
25.0000 mg | Freq: Once | INTRAMUSCULAR | Status: AC
  Administered 2013-03-07: 25 mg via INTRAVENOUS
  Filled 2013-03-07: qty 1

## 2013-03-07 MED ORDER — ATORVASTATIN CALCIUM 80 MG PO TABS
80.0000 mg | ORAL_TABLET | Freq: Every day | ORAL | Status: DC
  Administered 2013-03-08 – 2013-03-11 (×4): 80 mg via ORAL
  Filled 2013-03-07 (×4): qty 1

## 2013-03-07 MED ORDER — LISINOPRIL 40 MG PO TABS
40.0000 mg | ORAL_TABLET | Freq: Every day | ORAL | Status: DC
  Administered 2013-03-08 – 2013-03-11 (×4): 40 mg via ORAL
  Filled 2013-03-07 (×4): qty 1

## 2013-03-07 MED ORDER — HEPARIN SODIUM (PORCINE) 5000 UNIT/ML IJ SOLN
5000.0000 [IU] | Freq: Three times a day (TID) | INTRAMUSCULAR | Status: DC
  Administered 2013-03-07 – 2013-03-11 (×11): 5000 [IU] via SUBCUTANEOUS
  Filled 2013-03-07 (×17): qty 1

## 2013-03-07 MED ORDER — INSULIN ASPART 100 UNIT/ML ~~LOC~~ SOLN
0.0000 [IU] | Freq: Three times a day (TID) | SUBCUTANEOUS | Status: DC
  Administered 2013-03-08: 5 [IU] via SUBCUTANEOUS
  Administered 2013-03-08: 3 [IU] via SUBCUTANEOUS
  Administered 2013-03-08: 5 [IU] via SUBCUTANEOUS
  Administered 2013-03-09: 3 [IU] via SUBCUTANEOUS
  Administered 2013-03-10: 2 [IU] via SUBCUTANEOUS
  Administered 2013-03-11: 3 [IU] via SUBCUTANEOUS

## 2013-03-07 MED ORDER — SENNOSIDES-DOCUSATE SODIUM 8.6-50 MG PO TABS
1.0000 | ORAL_TABLET | Freq: Every evening | ORAL | Status: DC | PRN
  Administered 2013-03-10: 1 via ORAL
  Filled 2013-03-07: qty 1

## 2013-03-07 MED ORDER — INSULIN ASPART 100 UNIT/ML ~~LOC~~ SOLN: 0.0000 [IU] | Freq: Every day | SUBCUTANEOUS | Status: DC

## 2013-03-07 NOTE — ED Notes (Signed)
Pt voided with use of urinal

## 2013-03-07 NOTE — ED Notes (Signed)
i-STAT drawn by EMT Fayrene Fearing and given to Phlebotomist to obe taken to Mini-Lab.

## 2013-03-07 NOTE — ED Provider Notes (Signed)
Patient with fall. Came after lightheadedness. May be related to choking episode. This had continued pain in his back. MRI shows compression fracture. Seen by neurosurgery. Will be admitted to internal medicine. Has required repeated doses of morphine. While in the ED he developed a angioedema below his tongue. No respiratory distress. May be related to the morphine. patient has been given steroids, Benadryl, and Pepcid. While in bed to a step down unit.  Juliet Rude. Rubin Payor, MD 03/07/13 [id number]

## 2013-03-07 NOTE — ED Notes (Signed)
Received phone call from MRI patient states still in pain and refusing MRI.  Notified Doctor ordered Morphine 4mg  IVP. Called MRI to notify states was able to complete MRI.

## 2013-03-07 NOTE — ED Notes (Signed)
Neuro surgeon dr at bed side, pt being evaulated for TLSO brace

## 2013-03-07 NOTE — ED Notes (Signed)
CBG 68 

## 2013-03-07 NOTE — H&P (Signed)
[facility] History and Physical  Patient: Dwayne [name]  ZOX:[id number]  DOB: March 28, 1946  DOA: 03/07/2013  Referring physician: Dr. Rubin Payor PCP: No PCP Per Patient  Consults: Treatment Team:  Karn Cassis, MD   Chief Complaint: Marletta Lor  HPI: Dwayne [name] is a 67 y.o. male with Past medical history of hypertension, coronary artery disease, diabetes mellitus, dyslipidemia. The patient today presented with complain of a fall. Early this morning while he was drinking soda he had a choking episode after which while walking to the room he started having sense of lightheadedness followed by a fall. He denies any loss of consciousness or loss of control of bowel or bladder or tongue bit. It was not witnessed. He denies any complaint of chest pain or palpitation. He stays that he has similar choking episodes in the past but has never lost consciousness or fell down. After the fall he started having pain in his back but did not have any focal neurological deficit and came to the ER. He denies any head trauma or neck pain. Prior to my evaluation the patient also had an episode of swollen tongue after receiving medications in the ER. At the time of my ventilation the patient denies any complaint of shortness of breath and mentions that the swelling has been reduced to near normal.   Review of Systems: as mentioned in the history of present illness.  A Comprehensive review of the other systems is negative.  Past Medical History  Diagnosis Date  . Palpitations   . Hypertension     Benign  . Hyperlipidemia   . Reflux   . Diabetes mellitus without complication     Type 2  . Depression   . 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 Anxiety    Past Surgical History  Procedure Laterality Date  . Angioplasty    . Carotid stent insertion     Social History:  reports that he has been smoking Cigarettes.  He has a 45 pack-year smoking history. He has never used smokeless tobacco. He reports that he does not  drink alcohol or use illicit drugs. Patient is coming from home. Independent for most of his  ADL.  Allergies  Allergen Reactions  . Morphine And Related Swelling    Swelling of the tongue and face    History reviewed. No pertinent family history.  Prior to Admission medications   Medication Sig Start Date End Date Taking? Authorizing Provider  albuterol (PROVENTIL HFA;VENTOLIN HFA) 108 (90 BASE) MCG/ACT inhaler Inhale 2 puffs into the lungs every 6 (six) hours as needed for wheezing.   Yes Historical Provider, MD  atorvastatin (LIPITOR) 80 MG tablet Take 80 mg by mouth daily.   Yes Historical Provider, MD  clonazePAM (KLONOPIN) 0.5 MG tablet Take 0.25 mg by mouth 2 (two) times daily as needed for anxiety. 01/30/13  Yes Lars Masson, MD  glipiZIDE (GLUCOTROL) 10 MG tablet Take 10 mg by mouth 2 (two) times daily before a meal.   Yes Historical Provider, MD  hydrochlorothiazide (HYDRODIURIL) 25 MG tablet Take 25 mg by mouth daily.   Yes Historical Provider, MD  insulin glargine (LANTUS) 100 UNIT/ML injection Inject 14 Units into the skin at bedtime.   Yes Historical Provider, MD  lisinopril (PRINIVIL,ZESTRIL) 40 MG tablet Take 40 mg by mouth daily.   Yes Historical Provider, MD  metFORMIN (GLUCOPHAGE) 1000 MG tablet Take 1,000 mg by mouth 2 (two) times daily with a meal.   Yes Historical Provider,  MD  metoprolol succinate (TOPROL-XL) 50 MG 24 hr tablet Take 1 tablet (50 mg total) by mouth 2 (two) times daily. 03/05/13  Yes Lars Masson, MD  nitroGLYCERIN (NITROSTAT) 0.4 MG SL tablet Place 0.4 mg under the tongue every 5 (five) minutes as needed for chest pain.   Yes Historical Provider, MD  prasugrel (EFFIENT) 10 MG TABS tablet Take 10 mg by mouth daily.   Yes Historical Provider, MD  sertraline (ZOLOFT) 100 MG tablet Take 150 mg by mouth daily.    Yes Historical Provider, MD    Physical Exam: Filed Vitals:   03/07/13 2015 03/07/13 2030 03/07/13 2045 03/07/13 2100  BP: 169/81  152/71 158/77 161/79  Pulse: 79 81 80 73  Temp:      TempSrc:      Resp:      SpO2: 91% 91% 89% 92%    General: Alert, Awake and Oriented to Time, Place and Person. Appear in mild distress Eyes: PERRL ENT: Oral Mucosa clear moist. Neck: Difficult to assess  JVD, no  Carotid Bruits  Cardiovascular: S1 and S2 Present, no  Murmur, Peripheral Pulses Present Respiratory: Bilateral Air entry equal and Decreased, Clear to Auscultation,  No  Crackles,no  wheezes Abdomen: Bowel Sound Present, Soft and Non tender Skin: No  Rash Extremities: No  Pedal edema, no  calf tenderness Neurologic: Grossly Unremarkable.  Labs on Admission:  CBC:  Recent Labs Lab 03/07/13 1206 03/07/13 1231  WBC 19.4*  --   NEUTROABS 15.6*  --   HGB 15.2 14.6  HCT 42.7 43.0  MCV 95.3  --   PLT 265  --     CMP     Component Value Date/Time   NA 139 03/07/2013 1231   K 3.9 03/07/2013 1231   CL 99 03/07/2013 1231   GLUCOSE 108* 03/07/2013 1231   BUN 15 03/07/2013 1231   CREATININE 1.00 03/07/2013 1231    No results found for this basename: LIPASE, AMYLASE,  in the last 168 hours No results found for this basename: AMMONIA,  in the last 168 hours  Cardiac Enzymes: No results found for this basename: CKTOTAL, CKMB, CKMBINDEX, TROPONINI,  in the last 168 hours  BNP (last 3 results) No results found for this basename: PROBNP,  in the last 8760 hours  Radiological Exams on Admission: Dg Lumbar Spine Complete  03/07/2013   CLINICAL DATA:  Larey Seat. Back pain.  EXAM: LUMBAR SPINE - COMPLETE 4+ VIEW  COMPARISON:  None.  FINDINGS: There is an L2 compression fracture estimated at 30%. No obvious retropulsion. The facets are normally aligned. No pars defects. Moderate atherosclerotic calcifications involving the aorta and iliac arteries. The visualized bony pelvis is intact.  IMPRESSION: L2 compression fracture.   Electronically Signed   By: Loralie Champagne M.D.   On: 03/07/2013 13:25   Mr Lumbar Spine Wo  Contrast  03/07/2013   CLINICAL DATA:  Compression fracture post fall.  EXAM: MRI LUMBAR SPINE WITHOUT CONTRAST  TECHNIQUE: Multiplanar, multisequence MR imaging was performed. No intravenous contrast was administered.  COMPARISON:  03/07/2013 plain film exam.  FINDINGS: Exam is motion degraded.  The patient was not able to complete T1 axial imaging.  Last fully open disk space is labeled L5-S1. Present examination incorporates from T11-12 disc space through the S3 level.  Conus L1-2 level.  Acute compression fracture of the L2 vertebral body predominantly involving the superior endplate the with 16% loss of height centrally. The fracture line extends into the pedicle region.  Mild retropulsion of the compressed vertebra which contributes to mild further narrowing of congenitally narrowed spinal canal.  Atherosclerotic type changes of the abdominal aorta with abdominal aortic aneurysm measuring up to 3.8 cm. Tortuous common iliac arteries.  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 Additionally:  T11-12: Negative.  T12-L1: Negative.  L1-2: As above.  L2-3: As above. Additionally, bulge with greater extension left lateral position with slight encroachment upon the exiting left L2 nerve root in a lateral position. Mild to moderate spinal stenosis. Facet joint degenerative changes.  L3-4: Bulge greater left lateral position with slight encroachment upon the exiting left L3 nerve root. Bulge. Facet joint degenerative changes. Dorsal epidural fat. Moderate thecal sac narrowing.  L4-5: Facet joint degenerative changes greater on the right. Dorsal epidural fat. Short pedicles. Minimal bulge. Mild to moderate thecal sac narrowing. Mild foraminal narrowing greater on the right.  L5-S1: Moderate facet joint degenerative changes. Mild bulge.  IMPRESSION: Exam is motion degraded. The patient was not able to complete T1 axial imaging.  Acute compression fracture of the L2 vertebral body predominantly  involving the superior endplate the with 41% loss of height centrally. The fracture line extends into the pedicle region. Mild retropulsion of the compressed vertebra which contributes to mild further narrowing of congenitally narrowed spinal canal.  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  Atherosclerotic type changes aorta and iliac arteries with 3.8 cm abdominal aortic aneurysm incompletely assessed on present exam.   Electronically Signed   By: Bridgett Larsson M.D.   On: 03/07/2013 18:11   Dg Chest Port 1 View  03/07/2013   CLINICAL DATA:  Syncope, fall, sternal pain  EXAM: PORTABLE CHEST - 1 VIEW  COMPARISON:  None.  FINDINGS: Mild cardiomegaly. Atherosclerotic calcification noted in the transverse aorta. Metallic stent projects over the left heart, likely in the LAD. Mild vascular congestion without overt edema. No pneumothorax, pleural effusion or focal airspace consolidation. No displaced rib fracture identified.  IMPRESSION: 1. Borderline cardiomegaly. 2. Aortic atherosclerosis. 3. Pulmonary vascular congestion without overt edema.   Electronically Signed   By: Malachy Moan M.D.   On: 03/07/2013 20:18    EKG: Independently reviewed. nonspecific ST and T waves changes.  Assessment/Plan Principal Problem:   Lumbar vertebral fracture Active Problems:   CAD (coronary artery disease) of artery bypass graft   Essential hypertension   IDDM (insulin dependent diabetes mellitus)   Angioedema   1. Lumbar vertebral fracture The patient has been seen by neurosurgery who recommends no immediate intervention and TLSO brace for the patient. At present the patient has good pulses and neurological examination. Frequent neuro checks will be obtained Pain control will be done with Dilaudid Further recommendation will be done by neurosurgery   2.Angioedema The patient  received repeated doses of morphine after which he had swelling of his tongue which is now resolving. After discussing with the pharmacy and patient for pain control we will use minimal dose of Dilaudid. I would also continue Benadryl and Pepcid as well as steroids overnight. Patient will be monitored in the step down unit.   3.Hypertension  I would continue patient's home medications for blood pressure   4.Coronary artery disease Continue aspirin and prasugrel  DVT Prophylaxis: subcutaneous Heparin Nutrition: Cardiac diet diabetic  Code Status: Full  Family Communication: Wife  was present at bedside, opportunity was given to the family to ask question and all  questions were answered satisfactorily at the time of interview. Disposition: Admitted to npatient in step-down unit.  Author: Lynden Oxford, MD [facility] Pager: [phone] 03/07/2013, 9:52 PM    If 7PM-7AM, please contact night-coverage [email or web address] Password [email or web address]

## 2013-03-07 NOTE — Consult Note (Signed)
Reason for Consult:fracture lumbar spine Referring Physician: ER  Dwayne [name] is an 67 y.o. male.  HPI: 67 y/o male who had a syncopal episode today , one out of several, fell and hit his back. Since then he has been complaining of upper lumbar pain with no legs deficits. Had a mri of the lumbar spine and we were called for advise  Past Medical History  Diagnosis Date  . Palpitations   . Hypertension     Benign  . Hyperlipidemia   . Reflux   . Diabetes mellitus without complication     Type 2  . Depression   . 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 Anxiety     Past Surgical History  Procedure Laterality Date  . Angioplasty    . Carotid stent insertion      History reviewed. No pertinent family history.  Social History:  reports that he has been smoking Cigarettes.  He has a 45 pack-year smoking history. He has never used smokeless tobacco. He reports that he does not drink alcohol or use illicit drugs.  Allergies: No Known Allergies  Medications: see hp  Results for orders placed during the hospital encounter of 03/07/13 (from the past 48 hour(s))  CBC WITH DIFFERENTIAL     Status: Abnormal   Collection Time    03/07/13 12:06 PM      Result Value Range   WBC 19.4 (*) 4.0 - 10.5 K/uL   RBC 4.48  4.22 - 5.81 MIL/uL   Hemoglobin 15.2  13.0 - 17.0 g/dL   HCT 16.1  09.6 - 04.5 %   MCV 95.3  78.0 - 100.0 fL   MCH 33.9  26.0 - 34.0 pg   MCHC 35.6  30.0 - 36.0 g/dL   RDW 40.9  81.1 - 91.4 %   Platelets 265  150 - 400 K/uL   Neutrophils Relative % 81 (*) 43 - 77 %   Neutro Abs 15.6 (*) 1.7 - 7.7 K/uL   Lymphocytes Relative 14  12 - 46 %   Lymphs Abs 2.7  0.7 - 4.0 K/uL   Monocytes Relative 5  3 - 12 %   Monocytes Absolute 1.1 (*) 0.1 - 1.0 K/uL   Eosinophils Relative 0  0 - 5 %   Eosinophils Absolute 0.1  0.0 - 0.7 K/uL   Basophils Relative 0  0 - 1 %   Basophils Absolute 0.0  0.0 - 0.1 K/uL  POCT I-STAT, CHEM 8     Status: Abnormal   Collection Time    03/07/13 12:31 PM   Result Value Range   Sodium 139  135 - 145 mEq/L   Potassium 3.9  3.5 - 5.1 mEq/L   Chloride 99  96 - 112 mEq/L   BUN 15  6 - 23 mg/dL   Creatinine, Ser 7.82  0.50 - 1.35 mg/dL   Glucose, Bld 956 (*) 70 - 99 mg/dL   Calcium, Ion 2.13  0.86 - 1.30 mmol/L   TCO2 25  0 - 100 mmol/L   Hemoglobin 14.6  13.0 - 17.0 g/dL   HCT 57.8  46.9 - 62.9 %  GLUCOSE, CAPILLARY     Status: Abnormal   Collection Time    03/07/13  3:37 PM      Result Value Range   Glucose-Capillary 68 (*) 70 - 99 mg/dL   Comment 1 Documented in Chart     Comment 2 Notify RN    GLUCOSE, CAPILLARY  Status: None   Collection Time    03/07/13  5:06 PM      Result Value Range   Glucose-Capillary 96  70 - 99 mg/dL  GLUCOSE, CAPILLARY     Status: None   Collection Time    03/07/13  6:40 PM      Result Value Range   Glucose-Capillary 89  70 - 99 mg/dL    Dg Lumbar Spine Complete  03/07/2013   CLINICAL DATA:  Larey Seat. Back pain.  EXAM: LUMBAR SPINE - COMPLETE 4+ VIEW  COMPARISON:  None.  FINDINGS: There is an L2 compression fracture estimated at 30%. No obvious retropulsion. The facets are normally aligned. No pars defects. Moderate atherosclerotic calcifications involving the aorta and iliac arteries. The visualized bony pelvis is intact.  IMPRESSION: L2 compression fracture.   Electronically Signed   By: Loralie Champagne M.D.   On: 03/07/2013 13:25   Mr Lumbar Spine Wo Contrast  03/07/2013   CLINICAL DATA:  Compression fracture post fall.  EXAM: MRI LUMBAR SPINE WITHOUT CONTRAST  TECHNIQUE: Multiplanar, multisequence MR imaging was performed. No intravenous contrast was administered.  COMPARISON:  03/07/2013 plain film exam.  FINDINGS: Exam is motion degraded.  The patient was not able to complete T1 axial imaging.  Last fully open disk space is labeled L5-S1. Present examination incorporates from T11-12 disc space through the S3 level.  Conus L1-2 level.  Acute compression fracture of the L2 vertebral body predominantly  involving the superior endplate the with 16% loss of height centrally. The fracture line extends into the pedicle region. Mild retropulsion of the compressed vertebra which contributes to mild further narrowing of congenitally narrowed spinal canal.  Atherosclerotic type changes of the abdominal aorta with abdominal aortic aneurysm measuring up to 3.8 cm. Tortuous common iliac arteries.  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 Additionally:  T11-12: Negative.  T12-L1: Negative.  L1-2: As above.  L2-3: As above. Additionally, bulge with greater extension left lateral position with slight encroachment upon the exiting left L2 nerve root in a lateral position. Mild to moderate spinal stenosis. Facet joint degenerative changes.  L3-4: Bulge greater left lateral position with slight encroachment upon the exiting left L3 nerve root. Bulge. Facet joint degenerative changes. Dorsal epidural fat. Moderate thecal sac narrowing.  L4-5: Facet joint degenerative changes greater on the right. Dorsal epidural fat. Short pedicles. Minimal bulge. Mild to moderate thecal sac narrowing. Mild foraminal narrowing greater on the right.  L5-S1: Moderate facet joint degenerative changes. Mild bulge.  IMPRESSION: Exam is motion degraded. The patient was not able to complete T1 axial imaging.  Acute compression fracture of the L2 vertebral body predominantly involving the superior endplate the with 10% loss of height centrally. The fracture line extends into the pedicle region. Mild retropulsion of the compressed vertebra which contributes to mild further narrowing of congenitally narrowed spinal canal.  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  Atherosclerotic type changes aorta and iliac arteries with 3.8 cm abdominal aortic aneurysm incompletely assessed on present exam.    Electronically Signed   By: Bridgett Larsson M.D.   On: 03/07/2013 18:11    Review of Systems  Constitutional: Negative.   HENT: Negative.   Eyes: Negative.   Respiratory: Positive for shortness of breath.   Cardiovascular: Positive for leg swelling.  Genitourinary: Negative.   Musculoskeletal: Positive for back pain.  Skin: Negative.   Endo/Heme/Allergies:  Negative.   Psychiatric/Behavioral: Negative.    Blood pressure 150/72, pulse 77, temperature 98.3 F (36.8 C), temperature source Oral, resp. rate 18, SpO2 91.00%. Physical Exam hent, nl. Neck, nl. Cv, nl. Lungs ,some wheezing bilaterally. Abdomen soft. Extremities grade 1 edema. Lumbar spine mri shows a fracture of l2 about 50% with space in the canal although he has a congenital stenosis. Neuro shows, no deficits, norlam sensory, normal strength and dtr 1 plus Assessment/Plan: Patient is being advise to be admitted to the medical service to find out about the source of the syncopal episodes. Ortho tech aware of obtaining a TLSO. Pin can be managed with morphine. We will f/u while he is in the hospital. Spoke with him, wife and daugther  Karn Cassis 03/07/2013, 7:42 PM

## 2013-03-07 NOTE — ED Notes (Signed)
C/o of dizziness/vertigo causing him to stumble and fall.  C/o of low back.  Denies N/V and LOC. Hx of MI, stints, diabetic.  Pt in NAD, A&O.

## 2013-03-07 NOTE — ED Notes (Signed)
Dr. Jacubowitz at bedside 

## 2013-03-07 NOTE — ED Provider Notes (Signed)
CSN: [id number]     Arrival date & time 03/07/13  1047 History   First MD Initiated Contact with Patient 03/07/13 1131     Chief Complaint  Patient presents with  . Dizziness   (Consider location/radiation/quality/duration/timing/severity/associated sxs/prior Treatment) HPI Patient complains of feeling lightheaded and suffered near syncopal event lasted 30 seconds after choking on Pepsi nearly prior to coming here he fell as a result of near-syncopal event, landing on his buttocks. He complains of low back pain radiating to her right leg since the event. No incontinence. No other associated symptoms. Pain is worse with movement. Improved with remaining still. No blood per rectum. No other associated symptoms. No treatment prior to coming here. Past Medical History  Diagnosis Date  . Palpitations   . Hypertension     Benign  . Hyperlipidemia   . Reflux   . Diabetes mellitus without complication     Type 2  . Depression   . 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 Anxiety    Past Surgical History  Procedure Laterality Date  . Angioplasty    . Carotid stent insertion     History reviewed. No pertinent family history. History  Substance Use Topics  . Smoking status: Current Every Day Smoker -- 1.00 packs/day for 45 years    Types: Cigarettes  . Smokeless tobacco: Never Used  . Alcohol Use: No    Review of Systems  Musculoskeletal: Positive for back pain.  Neurological: Positive for weakness.       Denies vertigo  All other systems reviewed and are negative.    Allergies  Review of patient's allergies indicates no known allergies.  Home Medications   Current Outpatient Rx  Name  Route  Sig  Dispense  Refill  . albuterol (PROVENTIL HFA;VENTOLIN HFA) 108 (90 BASE) MCG/ACT inhaler   Inhalation   Inhale 2 puffs into the lungs every 6 (six) hours as needed for wheezing.         Marland Kitchen atorvastatin (LIPITOR) 80 MG tablet   Oral   Take 80 mg by mouth daily.         . clonazePAM  (KLONOPIN) 0.5 MG tablet   Oral   Take 0.25 mg by mouth 2 (two) times daily as needed for anxiety.         Marland Kitchen glipiZIDE (GLUCOTROL) 10 MG tablet   Oral   Take 10 mg by mouth 2 (two) times daily before a meal.         . hydrochlorothiazide (HYDRODIURIL) 25 MG tablet   Oral   Take 25 mg by mouth daily.         . insulin glargine (LANTUS) 100 UNIT/ML injection   Subcutaneous   Inject 14 Units into the skin at bedtime.         Marland Kitchen lisinopril (PRINIVIL,ZESTRIL) 40 MG tablet   Oral   Take 40 mg by mouth daily.         . metFORMIN (GLUCOPHAGE) 1000 MG tablet   Oral   Take 1,000 mg by mouth 2 (two) times daily with a meal.         . metoprolol succinate (TOPROL-XL) 50 MG 24 hr tablet   Oral   Take 1 tablet (50 mg total) by mouth 2 (two) times daily.   60 tablet   6     Take with or immediately following a meal.   . nitroGLYCERIN (NITROSTAT) 0.4 MG SL tablet   Sublingual   Place 0.4  mg under the tongue every 5 (five) minutes as needed for chest pain.         . prasugrel (EFFIENT) 10 MG TABS tablet   Oral   Take 10 mg by mouth daily.         . sertraline (ZOLOFT) 100 MG tablet   Oral   Take 150 mg by mouth daily.           BP 133/63  Temp(Src) 98.3 F (36.8 C) (Oral)  Resp 19  SpO2 97% Physical Exam  Nursing note and vitals reviewed. Constitutional: He is oriented to person, place, and time. He appears well-developed and well-nourished.  HENT:  Head: Normocephalic and atraumatic.  Eyes: Conjunctivae are normal. Pupils are equal, round, and reactive to light.  Neck: Neck supple. No tracheal deviation present. No thyromegaly present.  Cardiovascular: Normal rate and regular rhythm.   No murmur heard. Pulmonary/Chest: Effort normal and breath sounds normal.  Abdominal: Soft. Bowel sounds are normal. He exhibits no distension. There is no tenderness.  Obese  Musculoskeletal: Normal range of motion. He exhibits no edema and no tenderness.  Neurological:  He is alert and oriented to person, place, and time. He has normal reflexes. Coordination normal.  DTRs symmetric bilaterally knee jerk and biceps toes downgoing bilaterally  Skin: Skin is warm and dry. No rash noted.  Psychiatric: He has a normal mood and affect.    ED Course  Procedures (including critical care time) Labs Review Labs Reviewed - No data to display Imaging Review No results found.  EKG Interpretation     Ventricular Rate:  70 PR Interval:  355 QRS Duration: 91 QT Interval:  410 QTC Calculation: 442 R Axis:   -34 Text Interpretation:  Sinus rhythm Prolonged PR interval Inferior infarct, old No significant change since last tracing          Ls spinexray viewed by me. Results for orders placed during the hospital encounter of 03/07/13  CBC WITH DIFFERENTIAL      Result Value Range   WBC 19.4 (*) 4.0 - 10.5 K/uL   RBC 4.48  4.22 - 5.81 MIL/uL   Hemoglobin 15.2  13.0 - 17.0 g/dL   HCT 13.0  86.5 - 78.4 %   MCV 95.3  78.0 - 100.0 fL   MCH 33.9  26.0 - 34.0 pg   MCHC 35.6  30.0 - 36.0 g/dL   RDW 69.6  29.5 - 28.4 %   Platelets 265  150 - 400 K/uL   Neutrophils Relative % 81 (*) 43 - 77 %   Neutro Abs 15.6 (*) 1.7 - 7.7 K/uL   Lymphocytes Relative 14  12 - 46 %   Lymphs Abs 2.7  0.7 - 4.0 K/uL   Monocytes Relative 5  3 - 12 %   Monocytes Absolute 1.1 (*) 0.1 - 1.0 K/uL   Eosinophils Relative 0  0 - 5 %   Eosinophils Absolute 0.1  0.0 - 0.7 K/uL   Basophils Relative 0  0 - 1 %   Basophils Absolute 0.0  0.0 - 0.1 K/uL  GLUCOSE, CAPILLARY      Result Value Range   Glucose-Capillary 68 (*) 70 - 99 mg/dL   Comment 1 Documented in Chart     Comment 2 Notify RN    POCT I-STAT, CHEM 8      Result Value Range   Sodium 139  135 - 145 mEq/L   Potassium 3.9  3.5 - 5.1 mEq/L  Chloride 99  96 - 112 mEq/L   BUN 15  6 - 23 mg/dL   Creatinine, Ser 1.61  0.50 - 1.35 mg/dL   Glucose, Bld 096 (*) 70 - 99 mg/dL   Calcium, Ion 0.45  4.09 - 1.30 mmol/L   TCO2 25   0 - 100 mmol/L   Hemoglobin 14.6  13.0 - 17.0 g/dL   HCT 81.1  91.4 - 78.2 %   Dg Lumbar Spine Complete  03/07/2013   CLINICAL DATA:  Larey Seat. Back pain.  EXAM: LUMBAR SPINE - COMPLETE 4+ VIEW  COMPARISON:  None.  FINDINGS: There is an L2 compression fracture estimated at 30%. No obvious retropulsion. The facets are normally aligned. No pars defects. Moderate atherosclerotic calcifications involving the aorta and iliac arteries. The visualized bony pelvis is intact.  IMPRESSION: L2 compression fracture.   Electronically Signed   By: Loralie Champagne M.D.   On: 03/07/2013 13:25    MDM  No diagnosis found.  mri SCAN lumbar spine ordered DUE TO radicular nature back pain after fall.Pt signed out to Dr. Rubin Payor at 420 pm . Syncope likely vasovagal  Dx #1 syncope #2 acute compression fracture of L2 #3 lumbar radiculopathy   Doug Sou, MD 03/07/13 1626

## 2013-03-08 ENCOUNTER — Inpatient Hospital Stay (HOSPITAL_COMMUNITY): Payer: Medicare Other

## 2013-03-08 DIAGNOSIS — S32020A Wedge compression fracture of second lumbar vertebra, initial encounter for closed fracture: Secondary | ICD-10-CM

## 2013-03-08 DIAGNOSIS — I517 Cardiomegaly: Secondary | ICD-10-CM

## 2013-03-08 HISTORY — DX: Wedge compression fracture of second lumbar vertebra, initial encounter for closed fracture: S32.020A

## 2013-03-08 LAB — CBC
HCT: 41.8 % (ref 39.0–52.0)
MCHC: 35.9 g/dL (ref 30.0–36.0)
MCV: 93.9 fL (ref 78.0–100.0)
RBC: 4.45 MIL/uL (ref 4.22–5.81)
RDW: 13.2 % (ref 11.5–15.5)
WBC: 10.9 10*3/uL — ABNORMAL HIGH (ref 4.0–10.5)

## 2013-03-08 LAB — COMPREHENSIVE METABOLIC PANEL
ALT: 13 U/L (ref 0–53)
AST: 14 U/L (ref 0–37)
Albumin: 3.6 g/dL (ref 3.5–5.2)
Alkaline Phosphatase: 70 U/L (ref 39–117)
BUN: 16 mg/dL (ref 6–23)
CO2: 25 mEq/L (ref 19–32)
Calcium: 9 mg/dL (ref 8.4–10.5)
GFR calc Af Amer: >90 mL/min (ref >90)
GFR calc non Af Amer: >90 mL/min (ref >90)
Glucose, Bld: 230 mg/dL — ABNORMAL HIGH (ref 70–99)
Potassium: 4.1 mEq/L (ref 3.5–5.1)
Sodium: 136 mEq/L (ref 135–145)
Total Protein: 7.6 g/dL (ref 6.0–8.3)

## 2013-03-08 LAB — GLUCOSE, CAPILLARY
Glucose-Capillary: 132 mg/dL — ABNORMAL HIGH (ref 70–99)
Glucose-Capillary: 222 mg/dL — ABNORMAL HIGH (ref 70–99)
Glucose-Capillary: 161 mg/dL — ABNORMAL HIGH (ref 70–99)

## 2013-03-08 LAB — PROTIME-INR
INR: 1.04 (ref 0.00–1.49)
Prothrombin Time: 13.4 seconds (ref 11.6–15.2)

## 2013-03-08 LAB — D-DIMER, QUANTITATIVE (NOT AT ARMC): D-Dimer, Quant: 2.5 ug/mL-FEU — ABNORMAL HIGH (ref 0.00–0.48)

## 2013-03-08 MED ORDER — GLIPIZIDE 10 MG PO TABS
10.0000 mg | ORAL_TABLET | Freq: Two times a day (BID) | ORAL | Status: DC
  Administered 2013-03-08 – 2013-03-11 (×6): 10 mg via ORAL
  Filled 2013-03-08 (×8): qty 1

## 2013-03-08 MED ORDER — IOHEXOL 350 MG/ML SOLN
80.0000 mL | Freq: Once | INTRAVENOUS | Status: AC | PRN
  Administered 2013-03-08: 80 mL via INTRAVENOUS

## 2013-03-08 MED ORDER — CLONAZEPAM 0.5 MG PO TABS
0.2500 mg | ORAL_TABLET | Freq: Two times a day (BID) | ORAL | Status: DC | PRN
  Administered 2013-03-08 – 2013-03-11 (×5): 0.25 mg via ORAL
  Filled 2013-03-08 (×6): qty 1

## 2013-03-08 MED ORDER — HYDRALAZINE HCL 20 MG/ML IJ SOLN: 10.0000 mg | Freq: Four times a day (QID) | INTRAMUSCULAR | Status: DC | PRN

## 2013-03-08 MED ORDER — HYDROCODONE-ACETAMINOPHEN 5-325 MG PO TABS
1.0000 | ORAL_TABLET | Freq: Four times a day (QID) | ORAL | Status: DC | PRN
  Administered 2013-03-08 – 2013-03-09 (×4): 2 via ORAL
  Administered 2013-03-09: 1 via ORAL
  Administered 2013-03-10 (×3): 2 via ORAL
  Administered 2013-03-10 – 2013-03-11 (×2): 1 via ORAL
  Filled 2013-03-08: qty 1
  Filled 2013-03-08 (×8): qty 2
  Filled 2013-03-08: qty 1
  Filled 2013-03-08: qty 2

## 2013-03-08 MED ORDER — ALBUTEROL SULFATE (5 MG/ML) 0.5% IN NEBU
2.5000 mg | INHALATION_SOLUTION | Freq: Four times a day (QID) | RESPIRATORY_TRACT | Status: DC
  Administered 2013-03-08 – 2013-03-09 (×7): 2.5 mg via RESPIRATORY_TRACT
  Filled 2013-03-08 (×7): qty 0.5

## 2013-03-08 NOTE — Progress Notes (Signed)
Orthopedic Tech Progress Note Patient Details:  Dwayne [name] [birth year] [id number] Spoke with nurse, brace order has been canceled for this patient.  Patient ID: Dwayne [name], male   DOB: 08/08/1945, 67 y.o.   MRN: [id number]   [name] 03/08/2013, 11:26 AM

## 2013-03-08 NOTE — Progress Notes (Signed)
Patient ID: Dwayne [name], male   DOB: 07/10/1945, 67 y.o.   MRN: [id number] Much better. Less pain was able to sat at bedside. tlso pending.

## 2013-03-08 NOTE — Progress Notes (Signed)
UR completed.  Tabbatha Bordelon, RN BSN MHA CCM Trauma/Neuro ICU Case Manager [phone]  

## 2013-03-08 NOTE — Progress Notes (Signed)
Orthopedic Tech Progress Note Patient Details:  Dwayne [name] August 08, 1945 [id number]  Patient ID: Dwayne [name], male   DOB: 1946/02/21, 67 y.o.   MRN: [id number] Called [employer] with brace order; spoke with Anderson Malta, Jillann Charette 03/08/2013, 1:20 PM

## 2013-03-08 NOTE — Progress Notes (Signed)
[facility] PROGRESS NOTE  Dwayne [name] GNF:[id number] DOB: September 04, 1945 DOA: 03/07/2013 PCP: No PCP Per Patient  Assessment/Plan: Lumbar vertebral fracture  The patient has been seen by neurosurgery who recommends no immediate intervention and TLSO brace for the patient.  At present the patient has good pulses and neurological examination.  Frequent neuro checks will be obtained  Pain control will be done with Dilaudid and PO pain meds Further recommendation will be done by neurosurgery  -PT Eval  Angioedema  The patient received repeated doses of morphine after which he had swelling of his tongue which is now resolving. After discussing with the pharmacy and patient for pain control we will use minimal dose of Dilaudid.  I would also continue Benadryl and Pepcid overnight D/c steroids -transfer out of SDU later today if stable  Hypertension  continue patient's home medications for blood pressure   Coronary artery disease  Continue aspirin and plavix   Code Status: full Family Communication: patient Disposition Plan: tx to tele bed   Consultants:  NS  Procedures:    Antibiotics:    HPI/Subjective: Pain much better today No SOB No CP Patient says he never lost consciousness   Objective: Filed Vitals:   03/08/13 0400  BP: 158/72  Pulse: 71  Temp: 97.8 F (36.6 C)  Resp:     Intake/Output Summary (Last 24 hours) at 03/08/13 0834 Last data filed at 03/08/13 0400  Gross per 24 hour  Intake      0 ml  Output    400 ml  Net   -400 ml   Filed Weights   03/07/13 2226  Weight: 126.3 kg (278 lb 7.1 oz)    Exam:  General: Alert, Awake and Oriented to Time, Place and Person. Appear in mild distress  Eyes: PERRL  ENT: Oral Mucosa clear moist.  Neck: Difficult to assess JVD, no Carotid Bruits  Cardiovascular: S1 and S2 Present, no Murmur, Peripheral Pulses Present  Respiratory: Bilateral Air entry equal and Decreased, Clear to Auscultation,  No  Crackles,no wheezes  Abdomen: Bowel Sound Present, Soft and Non tender  Skin: No Rash  Extremities: No Pedal edema, no calf tenderness  Neurologic: Grossly Unremarkable.   Data Reviewed: Basic Metabolic Panel:  Recent Labs Lab 03/07/13 1231 03/08/13 0500  NA 139 136  K 3.9 4.1  CL 99 97  CO2  --  25  GLUCOSE 108* 230*  BUN 15 16  CREATININE 1.00 0.74  CALCIUM  --  9.0   Liver Function Tests:  Recent Labs Lab 03/08/13 0500  AST 14  ALT 13  ALKPHOS 70  BILITOT 0.6  PROT 7.6  ALBUMIN 3.6   No results found for this basename: LIPASE, AMYLASE,  in the last 168 hours No results found for this basename: AMMONIA,  in the last 168 hours CBC:  Recent Labs Lab 03/07/13 1206 03/07/13 1231 03/08/13 0500  WBC 19.4*  --  10.9*  NEUTROABS 15.6*  --   --   HGB 15.2 14.6 15.0  HCT 42.7 43.0 41.8  MCV 95.3  --  93.9  PLT 265  --  243   Cardiac Enzymes: No results found for this basename: CKTOTAL, CKMB, CKMBINDEX, TROPONINI,  in the last 168 hours BNP (last 3 results) No results found for this basename: PROBNP,  in the last 8760 hours CBG:  Recent Labs Lab 03/07/13 1537 03/07/13 1706 03/07/13 1840  GLUCAP 68* 96 89    No results found for this or any previous visit (  from the past 240 hour(s)).   Studies: Dg Lumbar Spine Complete  03/07/2013   CLINICAL DATA:  Larey Seat. Back pain.  EXAM: LUMBAR SPINE - COMPLETE 4+ VIEW  COMPARISON:  None.  FINDINGS: There is an L2 compression fracture estimated at 30%. No obvious retropulsion. The facets are normally aligned. No pars defects. Moderate atherosclerotic calcifications involving the aorta and iliac arteries. The visualized bony pelvis is intact.  IMPRESSION: L2 compression fracture.   Electronically Signed   By: Loralie Champagne M.D.   On: 03/07/2013 13:25   Mr Lumbar Spine Wo Contrast  03/07/2013   CLINICAL DATA:  Compression fracture post fall.  EXAM: MRI LUMBAR SPINE WITHOUT CONTRAST  TECHNIQUE: Multiplanar,  multisequence MR imaging was performed. No intravenous contrast was administered.  COMPARISON:  03/07/2013 plain film exam.  FINDINGS: Exam is motion degraded.  The patient was not able to complete T1 axial imaging.  Last fully open disk space is labeled L5-S1. Present examination incorporates from T11-12 disc space through the S3 level.  Conus L1-2 level.  Acute compression fracture of the L2 vertebral body predominantly involving the superior endplate the with 45% loss of height centrally. The fracture line extends into the pedicle region. Mild retropulsion of the compressed vertebra which contributes to mild further narrowing of congenitally narrowed spinal canal.  Atherosclerotic type changes of the abdominal aorta with abdominal aortic aneurysm measuring up to 3.8 cm. Tortuous common iliac arteries.  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 Additionally:  T11-12: Negative.  T12-L1: Negative.  L1-2: As above.  L2-3: As above. Additionally, bulge with greater extension left lateral position with slight encroachment upon the exiting left L2 nerve root in a lateral position. Mild to moderate spinal stenosis. Facet joint degenerative changes.  L3-4: Bulge greater left lateral position with slight encroachment upon the exiting left L3 nerve root. Bulge. Facet joint degenerative changes. Dorsal epidural fat. Moderate thecal sac narrowing.  L4-5: Facet joint degenerative changes greater on the right. Dorsal epidural fat. Short pedicles. Minimal bulge. Mild to moderate thecal sac narrowing. Mild foraminal narrowing greater on the right.  L5-S1: Moderate facet joint degenerative changes. Mild bulge.  IMPRESSION: Exam is motion degraded. The patient was not able to complete T1 axial imaging.  Acute compression fracture of the L2 vertebral body predominantly involving the superior endplate the with 40% loss of height centrally. The fracture line extends into the pedicle region. Mild retropulsion of  the compressed vertebra which contributes to mild further narrowing of congenitally narrowed spinal canal.  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  Atherosclerotic type changes aorta and iliac arteries with 3.8 cm abdominal aortic aneurysm incompletely assessed on present exam.   Electronically Signed   By: Bridgett Larsson M.D.   On: 03/07/2013 18:11   Dg Chest Port 1 View  03/07/2013   CLINICAL DATA:  Syncope, fall, sternal pain  EXAM: PORTABLE CHEST - 1 VIEW  COMPARISON:  None.  FINDINGS: Mild cardiomegaly. Atherosclerotic calcification noted in the transverse aorta. Metallic stent projects over the left heart, likely in the LAD. Mild vascular congestion without overt edema. No pneumothorax, pleural effusion or focal airspace consolidation. No displaced rib fracture identified.  IMPRESSION: 1. Borderline cardiomegaly. 2. Aortic atherosclerosis. 3. Pulmonary vascular congestion without overt edema.   Electronically Signed   By: Malachy Moan M.D.   On: 03/07/2013 20:18    Scheduled Meds: . albuterol  2.5  mg Nebulization Q6H  . atorvastatin  80 mg Oral Daily  . famotidine (PEPCID) IV  20 mg Intravenous Q12H  . heparin  5,000 Units Subcutaneous Q8H  . insulin aspart  0-15 Units Subcutaneous TID WC  . insulin aspart  0-5 Units Subcutaneous QHS  . insulin glargine  14 Units Subcutaneous QHS  . ipratropium  0.5 mg Nebulization Q6H  . lisinopril  40 mg Oral Daily  . metoprolol succinate  50 mg Oral BID  . nicotine  14 mg Transdermal Daily  . prasugrel  10 mg Oral Daily  . sertraline  150 mg Oral Daily  . sodium chloride  3 mL Intravenous Q12H   Continuous Infusions:   Principal Problem:   Lumbar vertebral fracture Active Problems:   CAD (coronary artery disease) of artery bypass graft   Essential hypertension   IDDM (insulin dependent diabetes mellitus)    Angioedema    Time spent: 35    [facility], Amairani Shuey  [facility] Pager [phone]. If 7PM-7AM, please contact night-coverage at [email or web address], password [facility] 03/08/2013, 8:34 AM  LOS: 1 day

## 2013-03-08 NOTE — Progress Notes (Signed)
  Echocardiogram 2D Echocardiogram has been performed.  Arvil Chaco 03/08/2013, 10:44 AM

## 2013-03-09 LAB — GLUCOSE, CAPILLARY
Glucose-Capillary: 174 mg/dL — ABNORMAL HIGH (ref 70–99)
Glucose-Capillary: 87 mg/dL (ref 70–99)

## 2013-03-09 MED ORDER — IPRATROPIUM BROMIDE 0.02 % IN SOLN
0.5000 mg | Freq: Two times a day (BID) | RESPIRATORY_TRACT | Status: DC
  Administered 2013-03-10 – 2013-03-11 (×3): 0.5 mg via RESPIRATORY_TRACT
  Filled 2013-03-09 (×3): qty 2.5

## 2013-03-09 MED ORDER — ALBUTEROL SULFATE (5 MG/ML) 0.5% IN NEBU
2.5000 mg | INHALATION_SOLUTION | Freq: Two times a day (BID) | RESPIRATORY_TRACT | Status: DC
  Administered 2013-03-10 – 2013-03-11 (×3): 2.5 mg via RESPIRATORY_TRACT
  Filled 2013-03-09 (×3): qty 0.5

## 2013-03-09 MED ORDER — LEVOFLOXACIN 750 MG PO TABS
750.0000 mg | ORAL_TABLET | Freq: Every day | ORAL | Status: DC
  Administered 2013-03-09 – 2013-03-11 (×3): 750 mg via ORAL
  Filled 2013-03-09 (×3): qty 1

## 2013-03-09 NOTE — Progress Notes (Signed)
Doing well.  Has been up OOB with brace  Temp:  [97.8 F (36.6 C)-98.8 F (37.1 C)] 98.1 F (36.7 C) (11/01 0721) Pulse Rate:  [61-103] 61 (11/01 0721) Resp:  [14-22] 14 (11/01 0721) BP: (125-177)/(64-85) 152/71 mmHg (11/01 0721) SpO2:  [90 %-95 %] 91 % (11/01 0721) Good strength and sensation   Plan: Up with brace ,  CPM

## 2013-03-09 NOTE — Progress Notes (Signed)
[facility] PROGRESS NOTE  Dwayne [name] ZOX:[id number] DOB: Dec 01, 1945 DOA: 03/07/2013 PCP: No PCP Per Patient  Assessment/Plan: Lumbar vertebral fracture  The patient has been seen by neurosurgery who recommends no immediate intervention and TLSO brace for the patient.   patient has good pulses and neurological examination- pain controlled  PO pain meds Further recommendation will be done by neurosurgery  -PT Eval  Angioedema  The patient received repeated doses of morphine after which he had swelling of his tongue which is now resolving.  -resolved  Hypertension  continue patient's home medications for blood pressure   Coronary artery disease  Continue aspirin and plavix  Anxiety -klonopin  Foci of infiltrate and bilateral upper lobes greater on right; due  to the focal nature of these infiltrates, recommend short-term  followup CT imaging in 3-4 months to reassess.   Code Status: full Family Communication: patient Disposition Plan: tx to med surg   Consultants:  NS  Procedures:    Antibiotics:  levaquin 11/1  HPI/Subjective: Pain controlled when still, controlled with oral pain meds when up No SOB, no CP No fever   Objective: Filed Vitals:   03/09/13 0721  BP: 152/71  Pulse: 61  Temp: 98.1 F (36.7 C)  Resp: 14    Intake/Output Summary (Last 24 hours) at 03/09/13 0801 Last data filed at 03/09/13 0600  Gross per 24 hour  Intake    840 ml  Output   1250 ml  Net   -410 ml   Filed Weights   03/07/13 2226  Weight: 126.3 kg (278 lb 7.1 oz)    Exam:  General: Alert, Awake and Oriented to Time, Place and Person. Appear in mild distress    Cardiovascular: S1 and S2 Present, no Murmur, Peripheral Pulses Present  Respiratory: Bilateral Air entry equal and Decreased, Clear to Auscultation,  No Crackles,no wheezes  Abdomen: Bowel Sound Present, Soft and Non tender  Skin: No Rash  Extremities: No Pedal edema, no calf tenderness      Data Reviewed: Basic Metabolic Panel:  Recent Labs Lab 03/07/13 1231 03/08/13 0500  NA 139 136  K 3.9 4.1  CL 99 97  CO2  --  25  GLUCOSE 108* 230*  BUN 15 16  CREATININE 1.00 0.74  CALCIUM  --  9.0   Liver Function Tests:  Recent Labs Lab 03/08/13 0500  AST 14  ALT 13  ALKPHOS 70  BILITOT 0.6  PROT 7.6  ALBUMIN 3.6   No results found for this basename: LIPASE, AMYLASE,  in the last 168 hours No results found for this basename: AMMONIA,  in the last 168 hours CBC:  Recent Labs Lab 03/07/13 1206 03/07/13 1231 03/08/13 0500  WBC 19.4*  --  10.9*  NEUTROABS 15.6*  --   --   HGB 15.2 14.6 15.0  HCT 42.7 43.0 41.8  MCV 95.3  --  93.9  PLT 265  --  243   Cardiac Enzymes: No results found for this basename: CKTOTAL, CKMB, CKMBINDEX, TROPONINI,  in the last 168 hours BNP (last 3 results) No results found for this basename: PROBNP,  in the last 8760 hours CBG:  Recent Labs Lab 03/07/13 2248 03/08/13 0819 03/08/13 1224 03/08/13 1857 03/08/13 2028  GLUCAP 132* 222* 219* 167* 161*    No results found for this or any previous visit (from the past 240 hour(s)).   Studies: Dg Lumbar Spine Complete  03/07/2013   CLINICAL DATA:  Larey Seat. Back pain.  EXAM: LUMBAR SPINE -  COMPLETE 4+ VIEW  COMPARISON:  None.  FINDINGS: There is an L2 compression fracture estimated at 30%. No obvious retropulsion. The facets are normally aligned. No pars defects. Moderate atherosclerotic calcifications involving the aorta and iliac arteries. The visualized bony pelvis is intact.  IMPRESSION: L2 compression fracture.   Electronically Signed   By: Loralie Champagne M.D.   On: 03/07/2013 13:25   Ct Angio Chest Pe W/cm &/or Wo Cm  03/08/2013   CLINICAL DATA:  Syncope, low oxygen saturation, elevated D-dimer, history hypertension, diabetes, myocardial infarction, smoking  EXAM: CT ANGIOGRAPHY CHEST WITH CONTRAST  TECHNIQUE: Multidetector CT imaging of the chest was performed using the  standard protocol during bolus administration of intravenous contrast. Multiplanar CT image reconstructions including MIPs were obtained to evaluate the vascular anatomy.  CONTRAST:  80mL OMNIPAQUE IOHEXOL 350 MG/ML SOLN  COMPARISON:  None  FINDINGS: Scattered atherosclerotic calcifications aorta and coronary arteries.  Aorta normal caliber without aneurysm or dissection.  Depending gallstone in gallbladder.  Small fluid collection is identified inferior to the spleen, 3.7 x 3.3 cm image 93, incompletely visualized, uncertain if related to spleen potentially upper pole of left kidney which is incompletely visualized.  Remaining visualized portions of abdomen normal appearance.  No definite thoracic adenopathy.  Few scattered respiratory motion artifacts.  Pulmonary arteries patent.  No evidence of pulmonary embolism.  Minimal bibasilar atelectasis.  Patchy airspace infiltrate identified in the right upper lobe and questionably in the medial aspect of the left upper lobe image 33.  Scattered emphysematous changes.  No definite discrete pulmonary mass or nodule.  Review of the MIP images confirms the above findings.  IMPRESSION: No evidence of pulmonary embolism.  Emphysematous changes with bibasilar atelectasis.  Foci of infiltrate and bilateral upper lobes greater on right; due to the focal nature of these infiltrates, recommend short-term followup CT imaging in 3-4 months to reassess.  Cholelithiasis.  Scattered atherosclerotic vascular disease.  Nonspecific cystic structure left upper quadrant 3.7 x 3.3 cm, incompletely localized and characterized.   Electronically Signed   By: Ulyses Southward M.D.   On: 03/08/2013 18:56   Mr Lumbar Spine Wo Contrast  03/07/2013   CLINICAL DATA:  Compression fracture post fall.  EXAM: MRI LUMBAR SPINE WITHOUT CONTRAST  TECHNIQUE: Multiplanar, multisequence MR imaging was performed. No intravenous contrast was administered.  COMPARISON:  03/07/2013 plain film exam.  FINDINGS: Exam  is motion degraded.  The patient was not able to complete T1 axial imaging.  Last fully open disk space is labeled L5-S1. Present examination incorporates from T11-12 disc space through the S3 level.  Conus L1-2 level.  Acute compression fracture of the L2 vertebral body predominantly involving the superior endplate the with 45% loss of height centrally. The fracture line extends into the pedicle region. Mild retropulsion of the compressed vertebra which contributes to mild further narrowing of congenitally narrowed spinal canal.  Atherosclerotic type changes of the abdominal aorta with abdominal aortic aneurysm measuring up to 3.8 cm. Tortuous common iliac arteries.  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 Additionally:  T11-12: Negative.  T12-L1: Negative.  L1-2: As above.  L2-3: As above. Additionally, bulge with greater extension left lateral position with slight encroachment upon the exiting left L2 nerve root in a lateral position. Mild to moderate spinal stenosis. Facet joint degenerative changes.  L3-4: Bulge greater left lateral position with slight encroachment upon the exiting left L3 nerve root. Bulge. Facet joint degenerative changes. Dorsal epidural fat. Moderate thecal sac narrowing.  L4-5: Facet joint degenerative changes greater on the right. Dorsal epidural fat. Short pedicles. Minimal bulge. Mild to moderate thecal sac narrowing. Mild foraminal narrowing greater on the right.  L5-S1: Moderate facet joint degenerative changes. Mild bulge.  IMPRESSION: Exam is motion degraded. The patient was not able to complete T1 axial imaging.  Acute compression fracture of the L2 vertebral body predominantly involving the superior endplate the with 86% loss of height centrally. The fracture line extends into the pedicle region. Mild retropulsion of the compressed vertebra which contributes to mild further narrowing of congenitally narrowed spinal canal.  Congenitally narrowed spinal  canal secondary to short pedicles with superimposed degenerative changes contribute to spinal stenosis and foraminal narrowing as noted above.  Atherosclerotic type changes aorta and iliac arteries with 3.8 cm abdominal aortic aneurysm incompletely assessed on present exam.   Electronically Signed   By: Bridgett Larsson M.D.   On: 03/07/2013 18:11   Dg Chest Port 1 View  03/07/2013   CLINICAL DATA:  Syncope, fall, sternal pain  EXAM: PORTABLE CHEST - 1 VIEW  COMPARISON:  None.  FINDINGS: Mild cardiomegaly. Atherosclerotic calcification noted in the transverse aorta. Metallic stent projects over the left heart, likely in the LAD. Mild vascular congestion without overt edema. No pneumothorax, pleural effusion or focal airspace consolidation. No displaced rib fracture identified.  IMPRESSION: 1. Borderline cardiomegaly. 2. Aortic atherosclerosis. 3. Pulmonary vascular congestion without overt edema.   Electronically Signed   By: Malachy Moan M.D.   On: 03/07/2013 20:18    Scheduled Meds: . albuterol  2.5 mg Nebulization Q6H  . atorvastatin  80 mg Oral Daily  . glipiZIDE  10 mg Oral BID AC  . heparin  5,000 Units Subcutaneous Q8H  . insulin aspart  0-15 Units Subcutaneous TID WC  . insulin aspart  0-5 Units Subcutaneous QHS  . insulin glargine  14 Units Subcutaneous QHS  . ipratropium  0.5 mg Nebulization Q6H  . levofloxacin  750 mg Oral Daily  . lisinopril  40 mg Oral Daily  . metoprolol succinate  50 mg Oral BID  . nicotine  14 mg Transdermal Daily  . prasugrel  10 mg Oral Daily  . sertraline  150 mg Oral Daily  . sodium chloride  3 mL Intravenous Q12H   Continuous Infusions:   Principal Problem:   Lumbar vertebral fracture Active Problems:   CAD (coronary artery disease) of artery bypass graft   Essential hypertension   IDDM (insulin dependent diabetes mellitus)   Angioedema    Time spent: 25    Marlin Canary  [facility] Pager [phone]. If 7PM-7AM, please contact  night-coverage at [email or web address], password [facility] 03/09/2013, 8:01 AM  LOS: 2 days

## 2013-03-09 NOTE — Evaluation (Signed)
Physical Therapy Evaluation Patient Details Name: Jeffry Vogelsang MRN: [id number] DOB: 07/22/1945 Today's Date: 03/09/2013 Time: 7829-5621 PT Time Calculation (min): 33 min  PT Assessment / Plan / Recommendation History of Present Illness  Yaakov Saindon is a 67 y.o. male with Past medical history of hypertension, coronary artery disease, diabetes mellitus, dyslipidemia. The patient today presented with complain of a fall. Early this morning while he was drinking soda he had a choking episode after which while walking to the room he started having sense of lightheadedness followed by a fall. He denies any loss of consciousness or loss of control of bowel or bladder or tongue bit. It was not witnessed. He denies any complaint of chest pain or palpitation. s/p fall pt sustained L1 compression fx  Clinical Impression  Pt admitted with L1 compression fx s/p fall. Pt currently with functional limitations due to the deficits listed below (see PT Problem List). Pt willing to work with therapy however needs extra time to complete task due to pain and overall fatigue. Pt will benefit from skilled PT to increase their independence and safety with mobility to allow discharge to the venue listed below.      PT Assessment  Patient needs continued PT services    Follow Up Recommendations  Home health PT;Supervision/Assistance - 24 hour    Equipment Recommendations  Rolling walker with 5" wheels    Frequency Min 5X/week    Precautions / Restrictions Precautions Precautions: Back Precaution Booklet Issued: Yes (comment) Precaution Comments: reveiwed 3/3 back precautions and handout given Required Braces or Orthoses: Spinal Brace Spinal Brace: Applied in sitting position;Thoracolumbosacral orthotic Restrictions Weight Bearing Restrictions: No   Pertinent Vitals/Pain 4/10 back pain     Mobility  Bed Mobility Bed Mobility: Rolling Right;Right Sidelying to Sit Rolling Right: 4: Min guard Right  Sidelying to Sit: 4: Min assist;With rails;HOB flat Details for Bed Mobility Assistance: (A) to elevate trunk OOB with max cues for proper technique to prevent twisting.  Transfers Transfers: Sit to Stand;Stand to Sit Sit to Stand: 4: Min assist;From bed Stand to Sit: 4: Min assist;To chair/3-in-1 Details for Transfer Assistance: (A) to maintain balance with cues for hand placement Ambulation/Gait Ambulation/Gait Assistance: 4: Min guard Ambulation Distance (Feet): 15 Feet Assistive device: Rolling walker Ambulation/Gait Assistance Details: minguard for safety with cues for RW placement and uprigh posture.  Gait Pattern: Step-through pattern;Decreased stride length;Shuffle;Wide base of support;Decreased stance time - right Stairs: No    Exercises     PT Diagnosis: Difficulty walking;Acute pain  PT Problem List: Decreased strength;Decreased activity tolerance;Decreased balance;Decreased mobility;Decreased knowledge of use of DME;Decreased knowledge of precautions;Pain PT Treatment Interventions: DME instruction;Gait training;Functional mobility training;Therapeutic activities;Therapeutic exercise;Balance training;Patient/family education     PT Goals(Current goals can be found in the care plan section) Acute Rehab PT Goals Patient Stated Goal: To get stronger and go home PT Goal Formulation: With patient/family Time For Goal Achievement: 03/16/13 Potential to Achieve Goals: Good  Visit Information  Last PT Received On: 03/09/13 Assistance Needed: +1 History of Present Illness: Izrael Peak is a 67 y.o. male with Past medical history of hypertension, coronary artery disease, diabetes mellitus, dyslipidemia. The patient today presented with complain of a fall. Early this morning while he was drinking soda he had a choking episode after which while walking to the room he started having sense of lightheadedness followed by a fall. He denies any loss of consciousness or loss of control of  bowel or bladder or tongue bit. It was not witnessed.  He denies any complaint of chest pain or palpitation.        Prior Functioning  Home Living Family/patient expects to be discharged to:: Private residence Living Arrangements: Spouse/significant other Available Help at Discharge: Family Type of Home: Apartment Home Access: Level entry Home Layout: One level Home Equipment: Cane - single point Prior Function Level of Independence: Independent with assistive device(s) Communication Communication: No difficulties Dominant Hand: Right    Cognition  Cognition Arousal/Alertness: Awake/alert Behavior During Therapy: WFL for tasks assessed/performed Overall Cognitive Status: Within Functional Limits for tasks assessed    Extremity/Trunk Assessment Lower Extremity Assessment Lower Extremity Assessment: Generalized weakness;RLE deficits/detail;LLE deficits/detail RLE: Unable to fully assess due to pain (pt with slight right knee buckling during ambulation) RLE Sensation: history of peripheral neuropathy LLE: Unable to fully assess due to pain LLE Sensation: history of peripheral neuropathy   Balance Balance Balance Assessed: Yes Static Sitting Balance Static Sitting - Balance Support: Feet supported Static Sitting - Level of Assistance: 6: Modified independent (Device/Increase time) Static Sitting - Comment/# of Minutes: ~3 minutes; pt needed total (A) to donn TLSO; educated pt's wife on brace  End of Session PT - End of Session Equipment Utilized During Treatment: Gait belt;Back brace Activity Tolerance: Patient limited by fatigue;Patient limited by pain Patient left: in chair;with call bell/phone within reach;with family/visitor present Nurse Communication: Mobility status;Precautions  GP     Loris Seelye 03/09/2013, 4:17 PM  Jake Shark, PT DPT [phone]

## 2013-03-09 NOTE — Progress Notes (Signed)
Patient arrived via wheelchair to 5W 10 from 3S. Patient lives at home with wife. Skin intact. Fall precaution initiated and patient verbalized understanding of bed alarm. Patient oriented to the room and unit protocol.

## 2013-03-09 NOTE — Progress Notes (Signed)
Pt transferred to 5W10 per MD order. Report called to receiving nurse and all questions answered.

## 2013-03-10 DIAGNOSIS — E119 Type 2 diabetes mellitus without complications: Secondary | ICD-10-CM

## 2013-03-10 DIAGNOSIS — R079 Chest pain, unspecified: Secondary | ICD-10-CM

## 2013-03-10 DIAGNOSIS — Z794 Long term (current) use of insulin: Secondary | ICD-10-CM

## 2013-03-10 LAB — GLUCOSE, CAPILLARY
Glucose-Capillary: 108 mg/dL — ABNORMAL HIGH (ref 70–99)
Glucose-Capillary: 174 mg/dL — ABNORMAL HIGH (ref 70–99)

## 2013-03-10 LAB — TROPONIN I: Troponin I: <0.3 ng/mL (ref <0.30)

## 2013-03-10 MED ORDER — CLONAZEPAM 0.5 MG PO TABS
0.2500 mg | ORAL_TABLET | Freq: Once | ORAL | Status: AC
  Administered 2013-03-10: 0.25 mg via ORAL

## 2013-03-10 MED ORDER — PANTOPRAZOLE SODIUM 40 MG PO TBEC
40.0000 mg | DELAYED_RELEASE_TABLET | Freq: Every day | ORAL | Status: DC
  Administered 2013-03-10 – 2013-03-11 (×2): 40 mg via ORAL
  Filled 2013-03-10 (×2): qty 1

## 2013-03-10 MED ORDER — GI COCKTAIL ~~LOC~~
30.0000 mL | Freq: Once | ORAL | Status: AC
  Administered 2013-03-10: 30 mL via ORAL
  Filled 2013-03-10: qty 30

## 2013-03-10 NOTE — Progress Notes (Signed)
Physical Therapy Treatment Patient Details Name: Dwayne [name] MRN: [id number] DOB: 1945/08/28 Today's Date: 03/10/2013 Time: 7829-5621 PT Time Calculation (min): 21 min  PT Assessment / Plan / Recommendation  History of Present Illness Dwayne [name] is a 67 y.o. male with Past medical history of hypertension, coronary artery disease, diabetes mellitus, dyslipidemia. The patient today presented with complain of a fall. Early this morning while he was drinking soda he had a choking episode after which while walking to the room he started having sense of lightheadedness followed by a fall. He denies any loss of consciousness or loss of control of bowel or bladder or tongue bit. It was not witnessed. He denies any complaint of chest pain or palpitation.    PT Comments   Pt is progressing well with his mobility today and is able to walk further with his RW.  Wife reports she and her daughter will be at home to help the pt and they feel confident that they can handle him physically (I mentioned rehab and this was their preference- to go home with home therapy).    Follow Up Recommendations  Home health PT;Supervision/Assistance - 24 hour     Does the patient have the potential to tolerate intense rehabilitation    NA  Barriers to Discharge   None      Equipment Recommendations  Rolling walker with 5" wheels    Recommendations for Other Services   None  Frequency Min 5X/week   Progress towards PT Goals Progress towards PT goals: Progressing toward goals  Plan Current plan remains appropriate    Precautions / Restrictions Precautions Precautions: Back Precaution Booklet Issued: Yes (comment) Precaution Comments: pt was given back precautions sheet on eval, pt was able to report "BAT", but needed some assit with what BAT stands for.  Reviewed back precautions and functional examples as well as brace application with pt/wife.  Required Braces or Orthoses: Spinal Brace Spinal Brace: Applied in  sitting position;Thoracolumbosacral orthotic   Pertinent Vitals/Pain See vitals flow sheet.    Mobility  Bed Mobility Bed Mobility: Rolling Right;Right Sidelying to Sit;Sit to Supine;Scooting to [facility] Rolling Right: 6: Modified independent (Device/Increase time);With rail Right Sidelying to Sit: 4: Min assist;With rails;HOB flat Sit to Supine: 4: Min assist;With rail;HOB flat Scooting to HOB: 6: Modified independent (Device/Increase time);With rail;Other (comment) (bed in trendelenberg and cues to bend knees) Details for Bed Mobility Assistance: min assist to support trunk during transitions.  Pt relying heavily on railing for support.  Transfers Transfers: Sit to Stand;Stand to Sit Sit to Stand: 4: Min assist;With upper extremity assist;With armrests;From bed Stand to Sit: 4: Min assist;With upper extremity assist;With armrests;To bed Details for Transfer Assistance: min assist to support trunk over weak legs and painful back.  Verbal cues to remind pt of safe hand placement.  Ambulation/Gait Ambulation/Gait Assistance: 4: Min assist Ambulation Distance (Feet): 85 Feet Assistive device: Rolling walker Ambulation/Gait Assistance Details: Min assist to support trunk over flexed knees.  Verbal cues for safe RW use including staying closer to RW during gait and inside of RW while turning.   Gait Pattern: Step-through pattern;Decreased stride length;Shuffle;Wide base of support;Decreased stance time - right Gait velocity: decreased      PT Goals (current goals can now be found in the care plan section) Acute Rehab PT Goals Patient Stated Goal: To get stronger and go home  Visit Information  Last PT Received On: 03/10/13 Assistance Needed: +1 History of Present Illness: Dwayne [name] is a 67 y.o.  male with Past medical history of hypertension, coronary artery disease, diabetes mellitus, dyslipidemia. The patient today presented with complain of a fall. Early this morning while he was  drinking soda he had a choking episode after which while walking to the room he started having sense of lightheadedness followed by a fall. He denies any loss of consciousness or loss of control of bowel or bladder or tongue bit. It was not witnessed. He denies any complaint of chest pain or palpitation.     Subjective Data  Subjective: Pt reports that he cannot tolerate sitting in the chair.  It hurts him too much Patient Stated Goal: To get stronger and go home   Cognition  Cognition Arousal/Alertness: Awake/alert Behavior During Therapy: WFL for tasks assessed/performed Overall Cognitive Status: Within Functional Limits for tasks assessed       End of Session PT - End of Session Equipment Utilized During Treatment: Back brace Activity Tolerance: Patient limited by fatigue;Patient limited by pain Patient left: in bed;with call bell/phone within reach;with family/visitor present Nurse Communication: Mobility status     Lurena Joiner B. Keoki Mchargue, PT, DPT [phone]   03/10/2013, 3:53 PM

## 2013-03-10 NOTE — Progress Notes (Signed)
Just had episode of chest pain  - being worked up  - pt much better now  -  Prior - has been up with brace  - no new neuro c/o   Temp:  [97.6 F (36.4 C)-98.1 F (36.7 C)] 97.8 F (36.6 C) (11/02 1026) Pulse Rate:  [63-96] 96 (11/02 1052) Resp:  [18-20] 20 (11/02 1052) BP: (136-195)/(75-103) 147/101 mmHg (11/02 1052) SpO2:  [93 %-96 %] 94 % (11/02 1052) Moves LE's well  Plan: Brace when OOB for fx - f/u with Dr Jeral Fruit

## 2013-03-10 NOTE — Significant Event (Signed)
Rapid Response Event Note  Overview:  pt. w/ on-going chest discomfort/ throat "burning, "burping"     Initial Focused Assessment: pt. A&O, BP elevated, EKG done, nurse has already given GI coctail and SL NTG x2. Pt. c/o chest discomfort mostly in upper chest/throat. Describes as "burning" and "congestion" that he can't "cough up" d/t back injury. Dr. Benjamine Mola in attendance also   Interventions: !2 lead EKG, GI cocktail, SL NTG x2, CE and protonix ordered  Event Summary: pt. beginning to feel relief from meds, EKG = NSR, pt. Repositioned in bed to relieve reflux symptoms, labs pnd   at      at          Dwayne [name]

## 2013-03-10 NOTE — Progress Notes (Signed)
Patient c/o chest pain and heart burn,  Notified rapid response and MD. Also will give nitroglycerin SL. Patient stated " i feel like am having a heart attack". Vitals signs taken and  And placed O2 for comfort.

## 2013-03-10 NOTE — Progress Notes (Signed)
Patient c/o of chest discomfort and palpitations, Mild SOB. Patient appears to be anxious with a mild tremor to his hands. Notifitied Dr.Vann and we are going to get a 12 lead EKG and give klonopin.

## 2013-03-10 NOTE — Progress Notes (Signed)
[facility] PROGRESS NOTE  Dwayne [name] NWG:[id number] DOB: 1945/07/29 DOA: 03/07/2013 PCP: No PCP Per Patient  Assessment/Plan:  Chest pain -cycle CE -EKG- NS -sounds more GI in origin -protonix GI cocktail  Lumbar vertebral fracture  The patient has been seen by neurosurgery who recommends no immediate intervention and TLSO brace for the patient.   patient has good pulses and neurological examination- pain controlled  PO pain meds Can be d/c'd -PT Eval  Angioedema  The patient received repeated doses of morphine after which he had swelling of his tongue which is now resolving.  -resolved  Hypertension  continue patient's home medications for blood pressure   Coronary artery disease  Continue aspirin and plavix  Anxiety -klonopin  Foci of infiltrate and bilateral upper lobes greater on right; due  to the focal nature of these infiltrates, recommend short-term  followup CT imaging in 3-4 months to reassess.   Code Status: full Family Communication: patient Disposition Plan: tx to med surg   Consultants:  NS  Procedures:    Antibiotics:  levaquin 11/1  HPI/Subjective: C/o chest -patient anxious   Objective: Filed Vitals:   03/10/13 1052  BP: 147/101  Pulse: 96  Temp:   Resp: 20    Intake/Output Summary (Last 24 hours) at 03/10/13 1119 Last data filed at 03/10/13 1000  Gross per 24 hour  Intake    486 ml  Output    800 ml  Net   -314 ml   Filed Weights   03/07/13 2226 03/09/13 1113  Weight: 126.3 kg (278 lb 7.1 oz) 123.4 kg (272 lb 0.8 oz)    Exam:  General: Alert, Awake and Oriented to Time, Place and Person. Appear in mild distress    Cardiovascular: S1 and S2 Present, no Murmur, Peripheral Pulses Present  Respiratory: Bilateral Air entry equal and Decreased, Clear to Auscultation,  No Crackles,no wheezes  Abdomen: Bowel Sound Present, Soft and Non tender  Skin: No Rash  Extremities: No Pedal edema, no calf tenderness      Data Reviewed: Basic Metabolic Panel:  Recent Labs Lab 03/07/13 1231 03/08/13 0500  NA 139 136  K 3.9 4.1  CL 99 97  CO2  --  25  GLUCOSE 108* 230*  BUN 15 16  CREATININE 1.00 0.74  CALCIUM  --  9.0   Liver Function Tests:  Recent Labs Lab 03/08/13 0500  AST 14  ALT 13  ALKPHOS 70  BILITOT 0.6  PROT 7.6  ALBUMIN 3.6   No results found for this basename: LIPASE, AMYLASE,  in the last 168 hours No results found for this basename: AMMONIA,  in the last 168 hours CBC:  Recent Labs Lab 03/07/13 1206 03/07/13 1231 03/08/13 0500  WBC 19.4*  --  10.9*  NEUTROABS 15.6*  --   --   HGB 15.2 14.6 15.0  HCT 42.7 43.0 41.8  MCV 95.3  --  93.9  PLT 265  --  243   Cardiac Enzymes: No results found for this basename: CKTOTAL, CKMB, CKMBINDEX, TROPONINI,  in the last 168 hours BNP (last 3 results) No results found for this basename: PROBNP,  in the last 8760 hours CBG:  Recent Labs Lab 03/08/13 2028 03/09/13 0757 03/09/13 1222 03/09/13 1659 03/10/13 0740  GLUCAP 161* 174* 87 119* 108*    No results found for this or any previous visit (from the past 240 hour(s)).   Studies: Ct Angio Chest Pe W/cm &/or Wo Cm  03/08/2013   CLINICAL DATA:  Syncope, low oxygen saturation, elevated D-dimer, history hypertension, diabetes, myocardial infarction, smoking  EXAM: CT ANGIOGRAPHY CHEST WITH CONTRAST  TECHNIQUE: Multidetector CT imaging of the chest was performed using the standard protocol during bolus administration of intravenous contrast. Multiplanar CT image reconstructions including MIPs were obtained to evaluate the vascular anatomy.  CONTRAST:  80mL OMNIPAQUE IOHEXOL 350 MG/ML SOLN  COMPARISON:  None  FINDINGS: Scattered atherosclerotic calcifications aorta and coronary arteries.  Aorta normal caliber without aneurysm or dissection.  Depending gallstone in gallbladder.  Small fluid collection is identified inferior to the spleen, 3.7 x 3.3 cm image 93, incompletely  visualized, uncertain if related to spleen potentially upper pole of left kidney which is incompletely visualized.  Remaining visualized portions of abdomen normal appearance.  No definite thoracic adenopathy.  Few scattered respiratory motion artifacts.  Pulmonary arteries patent.  No evidence of pulmonary embolism.  Minimal bibasilar atelectasis.  Patchy airspace infiltrate identified in the right upper lobe and questionably in the medial aspect of the left upper lobe image 33.  Scattered emphysematous changes.  No definite discrete pulmonary mass or nodule.  Review of the MIP images confirms the above findings.  IMPRESSION: No evidence of pulmonary embolism.  Emphysematous changes with bibasilar atelectasis.  Foci of infiltrate and bilateral upper lobes greater on right; due to the focal nature of these infiltrates, recommend short-term followup CT imaging in 3-4 months to reassess.  Cholelithiasis.  Scattered atherosclerotic vascular disease.  Nonspecific cystic structure left upper quadrant 3.7 x 3.3 cm, incompletely localized and characterized.   Electronically Signed   By: Ulyses Southward M.D.   On: 03/08/2013 18:56    Scheduled Meds: . albuterol  2.5 mg Nebulization BID  . atorvastatin  80 mg Oral Daily  . glipiZIDE  10 mg Oral BID AC  . heparin  5,000 Units Subcutaneous Q8H  . insulin aspart  0-15 Units Subcutaneous TID WC  . insulin aspart  0-5 Units Subcutaneous QHS  . insulin glargine  14 Units Subcutaneous QHS  . ipratropium  0.5 mg Nebulization BID  . levofloxacin  750 mg Oral Daily  . lisinopril  40 mg Oral Daily  . metoprolol succinate  50 mg Oral BID  . nicotine  14 mg Transdermal Daily  . pantoprazole  40 mg Oral Daily  . prasugrel  10 mg Oral Daily  . sertraline  150 mg Oral Daily  . sodium chloride  3 mL Intravenous Q12H   Continuous Infusions:   Principal Problem:   Lumbar vertebral fracture Active Problems:   CAD (coronary artery disease) of artery bypass graft    Essential hypertension   IDDM (insulin dependent diabetes mellitus)   Angioedema    Time spent: 25    Marlin Canary  [facility] Pager [phone]. If 7PM-7AM, please contact night-coverage at [email or web address], password [facility] 03/10/2013, 11:19 AM  LOS: 3 days

## 2013-03-10 NOTE — Progress Notes (Signed)
PT Cancellation Note  Patient Details Name: Dwayne [name] MRN: [id number] DOB: [birth year]   Cancelled Treatment:    Reason Eval/Treat Not Completed: Medical issues which prohibited therapy.  Pt having chest pain, RR RN and MD paged.  PT to check back later today or tomorrow.    Rollene Rotunda Alanis Clift, PT, DPT [phone]   03/10/2013, 11:21 AM

## 2013-03-11 LAB — TROPONIN I: Troponin I: <0.3 ng/mL (ref <0.30)

## 2013-03-11 MED ORDER — HYDROCODONE-ACETAMINOPHEN 5-325 MG PO TABS: 1.0000 | ORAL_TABLET | Freq: Four times a day (QID) | ORAL | Status: DC | PRN

## 2013-03-11 MED ORDER — LEVOFLOXACIN 750 MG PO TABS: 750.0000 mg | ORAL_TABLET | Freq: Every day | ORAL | Status: DC

## 2013-03-11 MED ORDER — PANTOPRAZOLE SODIUM 40 MG PO TBEC: 40.0000 mg | DELAYED_RELEASE_TABLET | Freq: Every day | ORAL | Status: DC

## 2013-03-11 NOTE — Care Management Note (Signed)
    Page 1 of 2   03/11/2013     11:11:42 AM   CARE MANAGEMENT NOTE 03/11/2013  Patient:  Dwayne [name], Dwayne [name]   Account Number:  [id number]  Date Initiated:  03/11/2013  Documentation initiated by:  Letha Cape  Subjective/Objective Assessment:   dx lumbar vertebral fx  admit- lives with spouse.     Action/Plan:   pt eval- rec hhpt/ot and rolling walker.   Anticipated DC Date:  03/11/2013   Anticipated DC Plan:  HOME W HOME HEALTH SERVICES      DC Planning Services  CM consult      [facility]  HOME HEALTH   Choice offered to / List presented to:  C-1 Patient   DME arranged  [facility]      DME agency  [facility]     HH arranged  HH-2 PT  HH-3 OT      [facility] agency  [facility]   Status of service:  Completed, signed off Medicare Important Message given?   (If response is "NO", the following Medicare IM given date fields will be blank) Date Medicare IM given:   Date Additional Medicare IM given:    Discharge Disposition:  HOME W HOME HEALTH SERVICES  Per UR Regulation:  Reviewed for med. necessity/level of care/duration of stay  If discussed at Long Length of Stay Meetings, dates discussed:    Comments:  03/11/13 11:10 Letha Cape RN, BSN [phone] patient lives with spouse and daughter, patient chose [facility] from agency list, for HHPT/Ot.  Referral made to [facility], soc will begin 24-48 hrs post discharge. Orders in for rolling walker.

## 2013-03-11 NOTE — Progress Notes (Signed)
[name] to be D/C'd Home with HHPT per MD order.  Discussed with the patient and all questions fully answered.    Medication List    STOP taking these medications       hydrochlorothiazide 25 MG tablet  Commonly known as:  HYDRODIURIL      TAKE these medications       albuterol 108 (90 BASE) MCG/ACT inhaler  Commonly known as:  PROVENTIL HFA;VENTOLIN HFA  Inhale 2 puffs into the lungs every 6 (six) hours as needed for wheezing.     atorvastatin 80 MG tablet  Commonly known as:  LIPITOR  Take 80 mg by mouth daily.     clonazePAM 0.5 MG tablet  Commonly known as:  KLONOPIN  Take 0.25 mg by mouth 2 (two) times daily as needed for anxiety.     glipiZIDE 10 MG tablet  Commonly known as:  GLUCOTROL  Take 10 mg by mouth 2 (two) times daily before a meal.     HYDROcodone-acetaminophen 5-325 MG per tablet  Commonly known as:  NORCO/VICODIN  Take 1-2 tablets by mouth every 6 (six) hours as needed.     insulin glargine 100 UNIT/ML injection  Commonly known as:  LANTUS  Inject 14 Units into the skin at bedtime.     levofloxacin 750 MG tablet  Commonly known as:  LEVAQUIN  Take 1 tablet (750 mg total) by mouth daily.     lisinopril 40 MG tablet  Commonly known as:  PRINIVIL,ZESTRIL  Take 40 mg by mouth daily.     metFORMIN 1000 MG tablet  Commonly known as:  GLUCOPHAGE  Take 1,000 mg by mouth 2 (two) times daily with a meal.     metoprolol succinate 50 MG 24 hr tablet  Commonly known as:  TOPROL-XL  Take 1 tablet (50 mg total) by mouth 2 (two) times daily.     nitroGLYCERIN 0.4 MG SL tablet  Commonly known as:  NITROSTAT  Place 0.4 mg under the tongue every 5 (five) minutes as needed for chest pain.     pantoprazole 40 MG tablet  Commonly known as:  PROTONIX  Take 1 tablet (40 mg total) by mouth daily.     prasugrel 10 MG Tabs tablet  Commonly known as:  EFFIENT  Take 10 mg by mouth daily.     sertraline 100 MG tablet  Commonly known as:  ZOLOFT  Take 150 mg  by mouth daily.        VVS, Skin clean, dry and intact without evidence of skin break down, no evidence of skin tears noted. IV catheter discontinued intact. Site without signs and symptoms of complications. Dressing and pressure applied.  An After Visit Summary was printed and given to the patient. Follow up appointments , new prescriptions and medication administration times given. Education and handouts given on vertebral fx. All questions answered. Patient escorted via WC, and D/C home via private auto.  Cindra Eves, RN 03/11/2013 1:17 PM

## 2013-03-11 NOTE — Discharge Summary (Signed)
Physician Discharge Summary  Dwayne [name] ZOX:[id number] DOB: May 09, 1946 DOA: 03/07/2013  PCP: No PCP Per Patient  Admit date: 03/07/2013 Discharge date: 03/11/2013  Time spent: 35 minutes  Recommendations for Outpatient Follow-up:  Foci of infiltrate and bilateral upper lobes greater on right; due  to the focal nature of these infiltrates, recommend short-term  followup CT imaging in 3-4 months to reassess.  Neurosurgery 1-2 weeks for repeat x ray  Discharge Diagnoses:  Principal Problem:   Lumbar vertebral fracture Active Problems:   CAD (coronary artery disease) of artery bypass graft   Essential hypertension   IDDM (insulin dependent diabetes mellitus)   Angioedema   Discharge Condition: improved  Diet recommendation: cardiac  Filed Weights   03/07/13 2226 03/09/13 1113  Weight: 126.3 kg (278 lb 7.1 oz) 123.4 kg (272 lb 0.8 oz)    History of present illness:  Dwayne [name] is a 67 y.o. male with Past medical history of hypertension, coronary artery disease, diabetes mellitus, dyslipidemia.  The patient today presented with complain of a fall. Early this morning while he was drinking soda he had a choking episode after which while walking to the room he started having sense of lightheadedness followed by a fall. He denies any loss of consciousness or loss of control of bowel or bladder or tongue bit. It was not witnessed. He denies any complaint of chest pain or palpitation. He stays that he has similar choking episodes in the past but has never lost consciousness or fell down. After the fall he started having pain in his back but did not have any focal neurological deficit and came to the ER. He denies any head trauma or neck pain.  Prior to my evaluation the patient also had an episode of swollen tongue after receiving medications in the ER. At the time of my ventilation the patient denies any complaint of shortness of breath and mentions that the swelling has been reduced to  near normal   Hospital Course:  Chest pain  -CE neg; work up more GI in origin  Lumbar vertebral fracture  The patient has been seen by neurosurgery who recommends no immediate intervention and TLSO brace for the patient.  patient has good pulses and neurological examination- pain controlled  PO pain meds  Home health  Angioedema  The patient received repeated doses of morphine after which he had swelling of his tongue which is now resolving.  -resolved   Hypertension  continue patient's home medications for blood pressure   Coronary artery disease  Continue aspirin and plavix   Anxiety  -klonopin   Foci of infiltrate and bilateral upper lobes greater on right; due  to the focal nature of these infiltrates, recommend short-term  followup CT imaging in 3-4 months to reassess.   Procedures:  none  Consultations:  NS  Discharge Exam: Filed Vitals:   03/11/13 0540  BP: 136/74  Pulse: 64  Temp: 98.6 F (37 C)  Resp: 18    General: A+Ox3, NAD Cardiovascular: rrr Respiratory: clear anterior  Discharge Instructions      Discharge Orders   Future Orders Complete By Expires   Diet - low sodium heart healthy  As directed    Diet Carb Modified  As directed    Discharge instructions  As directed    Comments:     Wear brace when up Needs PCP recommend short-term followup CT imaging in 3-4 months to reassess Follow up Dr. Jeral Fruit-  Home health   Increase activity slowly  As directed        Medication List    STOP taking these medications       hydrochlorothiazide 25 MG tablet  Commonly known as:  HYDRODIURIL      TAKE these medications       albuterol 108 (90 BASE) MCG/ACT inhaler  Commonly known as:  PROVENTIL HFA;VENTOLIN HFA  Inhale 2 puffs into the lungs every 6 (six) hours as needed for wheezing.     atorvastatin 80 MG tablet  Commonly known as:  LIPITOR  Take 80 mg by mouth daily.     clonazePAM 0.5 MG tablet  Commonly known as:   KLONOPIN  Take 0.25 mg by mouth 2 (two) times daily as needed for anxiety.     glipiZIDE 10 MG tablet  Commonly known as:  GLUCOTROL  Take 10 mg by mouth 2 (two) times daily before a meal.     HYDROcodone-acetaminophen 5-325 MG per tablet  Commonly known as:  NORCO/VICODIN  Take 1-2 tablets by mouth every 6 (six) hours as needed.     insulin glargine 100 UNIT/ML injection  Commonly known as:  LANTUS  Inject 14 Units into the skin at bedtime.     levofloxacin 750 MG tablet  Commonly known as:  LEVAQUIN  Take 1 tablet (750 mg total) by mouth daily.     lisinopril 40 MG tablet  Commonly known as:  PRINIVIL,ZESTRIL  Take 40 mg by mouth daily.     metFORMIN 1000 MG tablet  Commonly known as:  GLUCOPHAGE  Take 1,000 mg by mouth 2 (two) times daily with a meal.     metoprolol succinate 50 MG 24 hr tablet  Commonly known as:  TOPROL-XL  Take 1 tablet (50 mg total) by mouth 2 (two) times daily.     nitroGLYCERIN 0.4 MG SL tablet  Commonly known as:  NITROSTAT  Place 0.4 mg under the tongue every 5 (five) minutes as needed for chest pain.     pantoprazole 40 MG tablet  Commonly known as:  PROTONIX  Take 1 tablet (40 mg total) by mouth daily.     prasugrel 10 MG Tabs tablet  Commonly known as:  EFFIENT  Take 10 mg by mouth daily.     sertraline 100 MG tablet  Commonly known as:  ZOLOFT  Take 150 mg by mouth daily.       Allergies  Allergen Reactions  . Morphine And Related Swelling    Swelling of the tongue and face   Follow-up Information   Follow up with Karn Cassis, MD. (1-2 weeks)    Specialty:  Neurosurgery   Contact information:   [address] [place] [address] [phone]        The results of significant diagnostics from this hospitalization (including imaging, microbiology, ancillary and laboratory) are listed below for reference.    Significant Diagnostic Studies: Dg Lumbar Spine  Complete  03/07/2013   CLINICAL DATA:  Larey Seat. Back pain.  EXAM: LUMBAR SPINE - COMPLETE 4+ VIEW  COMPARISON:  None.  FINDINGS: There is an L2 compression fracture estimated at 30%. No obvious retropulsion. The facets are normally aligned. No pars defects. Moderate atherosclerotic calcifications involving the aorta and iliac arteries. The visualized bony pelvis is intact.  IMPRESSION: L2 compression fracture.   Electronically Signed   By: Loralie Champagne M.D.   On: 03/07/2013 13:25   Ct Angio Chest Pe W/cm &/or Wo Cm  03/08/2013   CLINICAL DATA:  Syncope, low oxygen saturation, elevated D-dimer, history hypertension, diabetes, myocardial infarction, smoking  EXAM: CT ANGIOGRAPHY CHEST WITH CONTRAST  TECHNIQUE: Multidetector CT imaging of the chest was performed using the standard protocol during bolus administration of intravenous contrast. Multiplanar CT image reconstructions including MIPs were obtained to evaluate the vascular anatomy.  CONTRAST:  80mL OMNIPAQUE IOHEXOL 350 MG/ML SOLN  COMPARISON:  None  FINDINGS: Scattered atherosclerotic calcifications aorta and coronary arteries.  Aorta normal caliber without aneurysm or dissection.  Depending gallstone in gallbladder.  Small fluid collection is identified inferior to the spleen, 3.7 x 3.3 cm image 93, incompletely visualized, uncertain if related to spleen potentially upper pole of left kidney which is incompletely visualized.  Remaining visualized portions of abdomen normal appearance.  No definite thoracic adenopathy.  Few scattered respiratory motion artifacts.  Pulmonary arteries patent.  No evidence of pulmonary embolism.  Minimal bibasilar atelectasis.  Patchy airspace infiltrate identified in the right upper lobe and questionably in the medial aspect of the left upper lobe image 33.  Scattered emphysematous changes.  No definite discrete pulmonary mass or nodule.  Review of the MIP images confirms the above findings.  IMPRESSION: No evidence of  pulmonary embolism.  Emphysematous changes with bibasilar atelectasis.  Foci of infiltrate and bilateral upper lobes greater on right; due to the focal nature of these infiltrates, recommend short-term followup CT imaging in 3-4 months to reassess.  Cholelithiasis.  Scattered atherosclerotic vascular disease.  Nonspecific cystic structure left upper quadrant 3.7 x 3.3 cm, incompletely localized and characterized.   Electronically Signed   By: Ulyses Southward M.D.   On: 03/08/2013 18:56   Mr Lumbar Spine Wo Contrast  03/07/2013   CLINICAL DATA:  Compression fracture post fall.  EXAM: MRI LUMBAR SPINE WITHOUT CONTRAST  TECHNIQUE: Multiplanar, multisequence MR imaging was performed. No intravenous contrast was administered.  COMPARISON:  03/07/2013 plain film exam.  FINDINGS: Exam is motion degraded.  The patient was not able to complete T1 axial imaging.  Last fully open disk space is labeled L5-S1. Present examination incorporates from T11-12 disc space through the S3 level.  Conus L1-2 level.  Acute compression fracture of the L2 vertebral body predominantly involving the superior endplate the with 40% loss of height centrally. The fracture line extends into the pedicle region. Mild retropulsion of the compressed vertebra which contributes to mild further narrowing of congenitally narrowed spinal canal.  Atherosclerotic type changes of the abdominal aorta with abdominal aortic aneurysm measuring up to 3.8 cm. Tortuous common iliac arteries.  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 Additionally:  T11-12: Negative.  T12-L1: Negative.  L1-2: As above.  L2-3: As above. Additionally, bulge with greater extension left lateral position with slight encroachment upon the exiting left L2 nerve root in a lateral position. Mild to moderate spinal stenosis. Facet joint degenerative changes.  L3-4: Bulge greater left lateral position with slight encroachment upon the exiting left L3 nerve root. Bulge.  Facet joint degenerative changes. Dorsal epidural fat. Moderate thecal sac narrowing.  L4-5: Facet joint degenerative changes greater on the right. Dorsal epidural fat. Short pedicles. Minimal bulge. Mild to moderate thecal sac narrowing. Mild foraminal narrowing greater on the right.  L5-S1: Moderate facet joint degenerative changes. Mild bulge.  IMPRESSION: Exam is motion degraded. The patient was not able to complete T1 axial imaging.  Acute compression fracture of the L2 vertebral body predominantly involving the superior endplate the with 98% loss of height centrally. The  fracture line extends into the pedicle region. Mild retropulsion of the compressed vertebra which contributes to mild further narrowing of congenitally narrowed spinal canal.  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  Atherosclerotic type changes aorta and iliac arteries with 3.8 cm abdominal aortic aneurysm incompletely assessed on present exam.   Electronically Signed   By: Bridgett Larsson M.D.   On: 03/07/2013 18:11   Dg Chest Port 1 View  03/07/2013   CLINICAL DATA:  Syncope, fall, sternal pain  EXAM: PORTABLE CHEST - 1 VIEW  COMPARISON:  None.  FINDINGS: Mild cardiomegaly. Atherosclerotic calcification noted in the transverse aorta. Metallic stent projects over the left heart, likely in the LAD. Mild vascular congestion without overt edema. No pneumothorax, pleural effusion or focal airspace consolidation. No displaced rib fracture identified.  IMPRESSION: 1. Borderline cardiomegaly. 2. Aortic atherosclerosis. 3. Pulmonary vascular congestion without overt edema.   Electronically Signed   By: Malachy Moan M.D.   On: 03/07/2013 20:18    Microbiology: No results found for this or any previous visit (from the past 240 hour(s)).   Labs: Basic Metabolic Panel:  Recent Labs Lab 03/07/13 1231  03/08/13 0500  NA 139 136  K 3.9 4.1  CL 99 97  CO2  --  25  GLUCOSE 108* 230*  BUN 15 16  CREATININE 1.00 0.74  CALCIUM  --  9.0   Liver Function Tests:  Recent Labs Lab 03/08/13 0500  AST 14  ALT 13  ALKPHOS 70  BILITOT 0.6  PROT 7.6  ALBUMIN 3.6   No results found for this basename: LIPASE, AMYLASE,  in the last 168 hours No results found for this basename: AMMONIA,  in the last 168 hours CBC:  Recent Labs Lab 03/07/13 1206 03/07/13 1231 03/08/13 0500  WBC 19.4*  --  10.9*  NEUTROABS 15.6*  --   --   HGB 15.2 14.6 15.0  HCT 42.7 43.0 41.8  MCV 95.3  --  93.9  PLT 265  --  243   Cardiac Enzymes:  Recent Labs Lab 03/10/13 1500 03/10/13 2220 03/11/13 0623  TROPONINI <0.30 <0.30 <0.30   BNP: BNP (last 3 results) No results found for this basename: PROBNP,  in the last 8760 hours CBG:  Recent Labs Lab 03/10/13 0740 03/10/13 1158 03/10/13 1657 03/10/13 2122 03/11/13 0843  GLUCAP 108* 137* 108* 174* 158*       Signed:  Ariyona Eid  [facility] 03/11/2013, 9:07 AM

## 2013-03-11 NOTE — Progress Notes (Signed)
Physical Therapy Treatment Patient Details Name: Dwayne [name] MRN: [id number] DOB: Nov 12, 1945 Today's Date: 03/11/2013 Time: 1115-1140 PT Time Calculation (min): 25 min  PT Assessment / Plan / Recommendation  History of Present Illness Dwayne [name] is a 67 y.o. male with Past medical history of hypertension, coronary artery disease, diabetes mellitus, dyslipidemia. The patient today presented with complain of a fall. Early this morning while he was drinking soda he had a choking episode after which while walking to the room he started having sense of lightheadedness followed by a fall. He denies any loss of consciousness or loss of control of bowel or bladder or tongue bit. It was not witnessed. He denies any complaint of chest pain or palpitation.    PT Comments   Pt making progress, addressed safety at home and recommendations of supervision whenever pt on his feet. Pt verbalized understanding. Will continue to follow pt while here.   Follow Up Recommendations  Home health PT;Supervision/Assistance - 24 hour           Equipment Recommendations  Rolling walker with 5" wheels       Frequency Min 5X/week   Progress towards PT Goals Progress towards PT goals: Progressing toward goals  Plan Current plan remains appropriate    Precautions / Restrictions Precautions Precautions: Back Precaution Booklet Issued: Yes (comment) Precaution Comments: pt able to recall "BAT" and able to verbalize what each stands for. Needs min cues during functional activities. Pt needs moderate assist to don brace, able to direct care.  Required Braces or Orthoses: Spinal Brace Spinal Brace: Applied in sitting position;Thoracolumbosacral orthotic Restrictions Weight Bearing Restrictions: No   Pertinent Vitals/Pain ~2/10 low back with activity    Mobility  Bed Mobility Bed Mobility: Rolling Right;Right Sidelying to Sit;Sit to Supine;Scooting to [facility] Sit to Supine: 4: Min assist;HOB flat Scooting to [facility]:  6: Modified independent (Device/Increase time);With rail;Other (comment) (bed in trendelenberg and cues to bend knees) Details for Bed Mobility Assistance: min assist to support trunk during transitions.  Mod cues needed for log roll Transfers Transfers: Sit to Stand;Stand to Sit Sit to Stand: From bed;5: Supervision;With upper extremity assist Stand to Sit: 4: Min assist;With upper extremity assist;To bed Details for Transfer Assistance:  Verbal cues to remind pt of safe hand placement.  Ambulation/Gait Ambulation/Gait Assistance: 4: Min assist Ambulation Distance (Feet): 180 Feet Assistive device: Rolling walker Ambulation/Gait Assistance Details: Min assist for negotiation through tight spaces in home - like environment. Cues needed for safe management of RW and to avoid lifting over obstacles and more walking around them. Slight instability with turns.  Gait Pattern: Step-through pattern;Decreased stride length;Shuffle;Wide base of support;Decreased stance time - right (bil. LE external rotation) Gait velocity: decreased Stairs: No     PT Goals (current goals can now be found in the care plan section) Acute Rehab PT Goals Patient Stated Goal: To get stronger and go home  Visit Information  Last PT Received On: 03/11/13 Assistance Needed: +1 History of Present Illness: Dwayne [name] is a 67 y.o. male with Past medical history of hypertension, coronary artery disease, diabetes mellitus, dyslipidemia. The patient today presented with complain of a fall. Early this morning while he was drinking soda he had a choking episode after which while walking to the room he started having sense of lightheadedness followed by a fall. He denies any loss of consciousness or loss of control of bowel or bladder or tongue bit. It was not witnessed. He denies any complaint of chest pain or  palpitation.     Subjective Data  Patient Stated Goal: To get stronger and go home   Cognition   Cognition Arousal/Alertness: Awake/alert Behavior During Therapy: WFL for tasks assessed/performed Overall Cognitive Status: Within Functional Limits for tasks assessed       End of Session PT - End of Session Equipment Utilized During Treatment: Back brace Activity Tolerance: Patient limited by fatigue;Patient tolerated treatment well Patient left: in bed;with call bell/phone within reach;with bed alarm set   GP     Wilhemina Bonito 03/11/2013, 12:24 PM

## 2013-03-12 DIAGNOSIS — F411 Generalized anxiety disorder: Secondary | ICD-10-CM | POA: Diagnosis not present

## 2013-03-12 DIAGNOSIS — Z9181 History of falling: Secondary | ICD-10-CM | POA: Diagnosis not present

## 2013-03-12 DIAGNOSIS — I10 Essential (primary) hypertension: Secondary | ICD-10-CM | POA: Diagnosis not present

## 2013-03-12 DIAGNOSIS — R269 Unspecified abnormalities of gait and mobility: Secondary | ICD-10-CM | POA: Diagnosis not present

## 2013-03-12 DIAGNOSIS — Z5189 Encounter for other specified aftercare: Secondary | ICD-10-CM | POA: Diagnosis not present

## 2013-03-12 DIAGNOSIS — M6281 Muscle weakness (generalized): Secondary | ICD-10-CM | POA: Diagnosis not present

## 2013-03-12 DIAGNOSIS — I2510 Atherosclerotic heart disease of native coronary artery without angina pectoris: Secondary | ICD-10-CM | POA: Diagnosis not present

## 2013-03-12 DIAGNOSIS — IMO0002 Reserved for concepts with insufficient information to code with codable children: Secondary | ICD-10-CM | POA: Diagnosis not present

## 2013-03-12 DIAGNOSIS — E119 Type 2 diabetes mellitus without complications: Secondary | ICD-10-CM | POA: Diagnosis not present

## 2013-03-13 DIAGNOSIS — IMO0002 Reserved for concepts with insufficient information to code with codable children: Secondary | ICD-10-CM | POA: Diagnosis not present

## 2013-03-13 DIAGNOSIS — R269 Unspecified abnormalities of gait and mobility: Secondary | ICD-10-CM | POA: Diagnosis not present

## 2013-03-13 DIAGNOSIS — E119 Type 2 diabetes mellitus without complications: Secondary | ICD-10-CM | POA: Diagnosis not present

## 2013-03-13 DIAGNOSIS — I2510 Atherosclerotic heart disease of native coronary artery without angina pectoris: Secondary | ICD-10-CM | POA: Diagnosis not present

## 2013-03-13 DIAGNOSIS — M6281 Muscle weakness (generalized): Secondary | ICD-10-CM | POA: Diagnosis not present

## 2013-03-13 DIAGNOSIS — I10 Essential (primary) hypertension: Secondary | ICD-10-CM | POA: Diagnosis not present

## 2013-03-14 DIAGNOSIS — IMO0002 Reserved for concepts with insufficient information to code with codable children: Secondary | ICD-10-CM | POA: Diagnosis not present

## 2013-03-14 DIAGNOSIS — Z09 Encounter for follow-up examination after completed treatment for conditions other than malignant neoplasm: Secondary | ICD-10-CM | POA: Diagnosis not present

## 2013-03-14 DIAGNOSIS — W19XXXA Unspecified fall, initial encounter: Secondary | ICD-10-CM | POA: Diagnosis not present

## 2013-03-14 DIAGNOSIS — R1310 Dysphagia, unspecified: Secondary | ICD-10-CM | POA: Diagnosis not present

## 2013-03-14 DIAGNOSIS — K219 Gastro-esophageal reflux disease without esophagitis: Secondary | ICD-10-CM | POA: Diagnosis not present

## 2013-03-14 DIAGNOSIS — R918 Other nonspecific abnormal finding of lung field: Secondary | ICD-10-CM | POA: Diagnosis not present

## 2013-03-14 DIAGNOSIS — F17200 Nicotine dependence, unspecified, uncomplicated: Secondary | ICD-10-CM | POA: Diagnosis not present

## 2013-03-15 DIAGNOSIS — E119 Type 2 diabetes mellitus without complications: Secondary | ICD-10-CM | POA: Diagnosis not present

## 2013-03-15 DIAGNOSIS — I10 Essential (primary) hypertension: Secondary | ICD-10-CM | POA: Diagnosis not present

## 2013-03-15 DIAGNOSIS — IMO0002 Reserved for concepts with insufficient information to code with codable children: Secondary | ICD-10-CM | POA: Diagnosis not present

## 2013-03-15 DIAGNOSIS — M6281 Muscle weakness (generalized): Secondary | ICD-10-CM | POA: Diagnosis not present

## 2013-03-15 DIAGNOSIS — I2510 Atherosclerotic heart disease of native coronary artery without angina pectoris: Secondary | ICD-10-CM | POA: Diagnosis not present

## 2013-03-15 DIAGNOSIS — R269 Unspecified abnormalities of gait and mobility: Secondary | ICD-10-CM | POA: Diagnosis not present

## 2013-03-18 DIAGNOSIS — M6281 Muscle weakness (generalized): Secondary | ICD-10-CM | POA: Diagnosis not present

## 2013-03-18 DIAGNOSIS — IMO0002 Reserved for concepts with insufficient information to code with codable children: Secondary | ICD-10-CM | POA: Diagnosis not present

## 2013-03-18 DIAGNOSIS — E119 Type 2 diabetes mellitus without complications: Secondary | ICD-10-CM | POA: Diagnosis not present

## 2013-03-18 DIAGNOSIS — I2510 Atherosclerotic heart disease of native coronary artery without angina pectoris: Secondary | ICD-10-CM | POA: Diagnosis not present

## 2013-03-18 DIAGNOSIS — I10 Essential (primary) hypertension: Secondary | ICD-10-CM | POA: Diagnosis not present

## 2013-03-18 DIAGNOSIS — R269 Unspecified abnormalities of gait and mobility: Secondary | ICD-10-CM | POA: Diagnosis not present

## 2013-03-19 DIAGNOSIS — IMO0002 Reserved for concepts with insufficient information to code with codable children: Secondary | ICD-10-CM | POA: Diagnosis not present

## 2013-03-19 DIAGNOSIS — E119 Type 2 diabetes mellitus without complications: Secondary | ICD-10-CM | POA: Diagnosis not present

## 2013-03-19 DIAGNOSIS — M6281 Muscle weakness (generalized): Secondary | ICD-10-CM | POA: Diagnosis not present

## 2013-03-19 DIAGNOSIS — R269 Unspecified abnormalities of gait and mobility: Secondary | ICD-10-CM | POA: Diagnosis not present

## 2013-03-19 DIAGNOSIS — I10 Essential (primary) hypertension: Secondary | ICD-10-CM | POA: Diagnosis not present

## 2013-03-19 DIAGNOSIS — I2510 Atherosclerotic heart disease of native coronary artery without angina pectoris: Secondary | ICD-10-CM | POA: Diagnosis not present

## 2013-03-20 ENCOUNTER — Emergency Department (HOSPITAL_COMMUNITY)
Admission: EM | Admit: 2013-03-20 | Discharge: 2013-03-20 | Disposition: A | Discharge status: Home / Self Care | Payer: Medicare Other | Attending: Emergency Medicine | Admitting: Emergency Medicine

## 2013-03-20 ENCOUNTER — Encounter (HOSPITAL_COMMUNITY): Payer: Self-pay | Admitting: Emergency Medicine

## 2013-03-20 DIAGNOSIS — Z79899 Other long term (current) drug therapy: Secondary | ICD-10-CM | POA: Diagnosis not present

## 2013-03-20 DIAGNOSIS — F3289 Other specified depressive episodes: Secondary | ICD-10-CM | POA: Diagnosis not present

## 2013-03-20 DIAGNOSIS — M545 Low back pain, unspecified: Secondary | ICD-10-CM | POA: Insufficient documentation

## 2013-03-20 DIAGNOSIS — I10 Essential (primary) hypertension: Secondary | ICD-10-CM | POA: Insufficient documentation

## 2013-03-20 DIAGNOSIS — K219 Gastro-esophageal reflux disease without esophagitis: Secondary | ICD-10-CM | POA: Diagnosis not present

## 2013-03-20 DIAGNOSIS — E119 Type 2 diabetes mellitus without complications: Secondary | ICD-10-CM | POA: Diagnosis not present

## 2013-03-20 DIAGNOSIS — G8911 Acute pain due to trauma: Secondary | ICD-10-CM | POA: Insufficient documentation

## 2013-03-20 DIAGNOSIS — Z8674 Personal history of sudden cardiac arrest: Secondary | ICD-10-CM | POA: Insufficient documentation

## 2013-03-20 DIAGNOSIS — Z794 Long term (current) use of insulin: Secondary | ICD-10-CM | POA: Diagnosis not present

## 2013-03-20 DIAGNOSIS — F411 Generalized anxiety disorder: Secondary | ICD-10-CM | POA: Diagnosis not present

## 2013-03-20 DIAGNOSIS — E785 Hyperlipidemia, unspecified: Secondary | ICD-10-CM | POA: Diagnosis not present

## 2013-03-20 DIAGNOSIS — S32020A Wedge compression fracture of second lumbar vertebra, initial encounter for closed fracture: Secondary | ICD-10-CM

## 2013-03-20 DIAGNOSIS — M6281 Muscle weakness (generalized): Secondary | ICD-10-CM | POA: Diagnosis not present

## 2013-03-20 DIAGNOSIS — I2510 Atherosclerotic heart disease of native coronary artery without angina pectoris: Secondary | ICD-10-CM | POA: Diagnosis not present

## 2013-03-20 DIAGNOSIS — IMO0002 Reserved for concepts with insufficient information to code with codable children: Secondary | ICD-10-CM | POA: Diagnosis not present

## 2013-03-20 DIAGNOSIS — Z9861 Coronary angioplasty status: Secondary | ICD-10-CM | POA: Insufficient documentation

## 2013-03-20 DIAGNOSIS — F329 Major depressive disorder, single episode, unspecified: Secondary | ICD-10-CM | POA: Diagnosis not present

## 2013-03-20 DIAGNOSIS — F17200 Nicotine dependence, unspecified, uncomplicated: Secondary | ICD-10-CM | POA: Diagnosis not present

## 2013-03-20 DIAGNOSIS — S32009A Unspecified fracture of unspecified lumbar vertebra, initial encounter for closed fracture: Secondary | ICD-10-CM | POA: Diagnosis not present

## 2013-03-20 DIAGNOSIS — R269 Unspecified abnormalities of gait and mobility: Secondary | ICD-10-CM | POA: Diagnosis not present

## 2013-03-20 HISTORY — DX: Wedge compression fracture of second lumbar vertebra, initial encounter for closed fracture: S32.020A

## 2013-03-20 MED ORDER — OXYCODONE-ACETAMINOPHEN 5-325 MG PO TABS: 2.0000 | ORAL_TABLET | ORAL | Status: DC | PRN

## 2013-03-20 MED ORDER — HYDROMORPHONE HCL PF 2 MG/ML IJ SOLN
2.0000 mg | Freq: Once | INTRAMUSCULAR | Status: AC
  Administered 2013-03-20: 2 mg via INTRAMUSCULAR
  Filled 2013-03-20: qty 1

## 2013-03-20 NOTE — ED Notes (Addendum)
C/o low back pain, rates 10/10, wearing back brace, recent admission for L2 compression fx, no surgery done. Also mentions numbness in R leg. Pain worse with movement. Alert, NAD, calm, interactive, sitting in w/c, family with pt. Took 2 vicodin at . "feels better with back brace on".

## 2013-03-20 NOTE — ED Provider Notes (Signed)
CSN: [id number]     Arrival date & time 03/20/13  1308 History   First MD Initiated Contact with Patient 03/20/13 [phone]     Chief Complaint  Patient presents with  . Back Pain   (Consider location/radiation/quality/duration/timing/severity/associated sxs/prior Treatment) HPI Comments: Patient is a 67 year old male past medical history of coronary artery disease and diabetes.  He was recently admitted here with an L2 compression fracture after a fall. His was discharged several days ago and has been doing reasonably well while taking hydrocodone. This morning his pain became much worse. He denies any new injury or trauma. He denies any radiation into his legs and denies any bowel or bladder complaints.   Patient is a 67 y.o. male presenting with back pain. The history is provided by the patient.  Back Pain Location:  Lumbar spine Quality:  Stabbing Radiates to:  Does not radiate Pain severity:  Severe Pain is:  Same all the time Onset quality:  Sudden Duration:  2 weeks Timing:  Constant Progression:  Worsening Chronicity:  New Relieved by:  Nothing Worsened by:  Ambulation, bending, movement and palpation Associated symptoms: no abdominal pain, no abdominal swelling, no bladder incontinence and no bowel incontinence     Past Medical History  Diagnosis Date  . Palpitations   . Hypertension     Benign  . Hyperlipidemia   . Reflux   . Diabetes mellitus without complication     Type 2  . Depression   . 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 Anxiety   . Compression fracture of L2    Past Surgical History  Procedure Laterality Date  . Angioplasty    . Carotid stent insertion     No family history on file. History  Substance Use Topics  . Smoking status: Current Every Day Smoker -- 1.00 packs/day for 45 years    Types: Cigarettes  . Smokeless tobacco: Never Used  . Alcohol Use: No    Review of Systems  Gastrointestinal: Negative for abdominal pain and bowel incontinence.   Genitourinary: Negative for bladder incontinence.  Musculoskeletal: Positive for back pain.  All other systems reviewed and are negative.    Allergies  Morphine and related  Home Medications   Current Outpatient Rx  Name  Route  Sig  Dispense  Refill  . albuterol (PROVENTIL HFA;VENTOLIN HFA) 108 (90 BASE) MCG/ACT inhaler   Inhalation   Inhale 2 puffs into the lungs every 6 (six) hours as needed for wheezing.         Marland Kitchen atorvastatin (LIPITOR) 80 MG tablet   Oral   Take 80 mg by mouth daily.         . clonazePAM (KLONOPIN) 0.5 MG tablet   Oral   Take 0.25 mg by mouth 2 (two) times daily as needed for anxiety.         Marland Kitchen glipiZIDE (GLUCOTROL) 10 MG tablet   Oral   Take 10 mg by mouth 2 (two) times daily before a meal.         . HYDROcodone-acetaminophen (NORCO/VICODIN) 5-325 MG per tablet   Oral   Take 1-2 tablets by mouth every 6 (six) hours as needed.   30 tablet   0   . insulin glargine (LANTUS) 100 UNIT/ML injection   Subcutaneous   Inject 14 Units into the skin at bedtime.         Marland Kitchen lisinopril (PRINIVIL,ZESTRIL) 40 MG tablet   Oral   Take 40 mg by mouth  daily.         . metFORMIN (GLUCOPHAGE) 1000 MG tablet   Oral   Take 1,000 mg by mouth 2 (two) times daily with a meal.         . metoprolol succinate (TOPROL-XL) 50 MG 24 hr tablet   Oral   Take 1 tablet (50 mg total) by mouth 2 (two) times daily.   60 tablet   6     Take with or immediately following a meal.   . nitroGLYCERIN (NITROSTAT) 0.4 MG SL tablet   Sublingual   Place 0.4 mg under the tongue every 5 (five) minutes as needed for chest pain.         . pantoprazole (PROTONIX) 40 MG tablet   Oral   Take 1 tablet (40 mg total) by mouth daily.   30 tablet   0   . prasugrel (EFFIENT) 10 MG TABS tablet   Oral   Take 10 mg by mouth daily.         . sertraline (ZOLOFT) 100 MG tablet   Oral   Take 150 mg by mouth daily.          Marland Kitchen levofloxacin (LEVAQUIN) 750 MG tablet    Oral   Take 1 tablet (750 mg total) by mouth daily.   4 tablet   0    BP 141/62  Pulse 57  Temp(Src) 98 F (36.7 C) (Oral)  Resp 20  Ht 5\' 10"  (1.778 m)  Wt 272 lb (123.378 kg)  BMI 39.03 kg/m2  SpO2 100% Physical Exam  Nursing note and vitals reviewed. Constitutional: He is oriented to person, place, and time. He appears well-developed and well-nourished.  HENT:  Head: Normocephalic and atraumatic.  Mouth/Throat: Oropharynx is clear and moist.  Neck: Normal range of motion. Neck supple.  Cardiovascular: Normal rate, regular rhythm and normal heart sounds.   No murmur heard. Pulmonary/Chest: Effort normal and breath sounds normal. No respiratory distress. He has no wheezes. He has no rales.  Abdominal: Soft. Bowel sounds are normal. He exhibits no distension. There is no tenderness.  Musculoskeletal: Normal range of motion.  There is tenderness to palpation in the upper lumbar region.  Neurological: He is alert and oriented to person, place, and time.  Strength is 5 out of 5 in bilateral lower extremities. Patellar and Achilles DTRs are 2+ and symmetrical.  Skin: Skin is warm and dry. He is not diaphoretic.    ED Course  Procedures (including critical care time) Labs Review Labs Reviewed - No data to display Imaging Review No results found.  EKG Interpretation   None       MDM  No diagnosis found. Patient recently diagnosed with an L2 compression fracture and was hospitalized for this. He was discharged several days ago but his pain returned. He did not get much relief with his hydrocodone. On exam his reflexes and strength are symmetrical. There are no bowel or bladder complaints the raise any red flags that there is anything acute going on. He was given an IM injection of dilaudid which significantly helped his pain. He was ambulated in the hallway and was able to get around with out any significant difficulty. At this point I feel as though he is stable for  discharge. I will stop his hydrocodone and changing to oxycodone. He is to followup with his primary Dr. in the next few days.    Geoffery Lyons, MD 03/20/13 1023

## 2013-03-21 DIAGNOSIS — I10 Essential (primary) hypertension: Secondary | ICD-10-CM | POA: Diagnosis not present

## 2013-03-21 DIAGNOSIS — M6281 Muscle weakness (generalized): Secondary | ICD-10-CM | POA: Diagnosis not present

## 2013-03-21 DIAGNOSIS — E119 Type 2 diabetes mellitus without complications: Secondary | ICD-10-CM | POA: Diagnosis not present

## 2013-03-21 DIAGNOSIS — IMO0002 Reserved for concepts with insufficient information to code with codable children: Secondary | ICD-10-CM | POA: Diagnosis not present

## 2013-03-21 DIAGNOSIS — I2510 Atherosclerotic heart disease of native coronary artery without angina pectoris: Secondary | ICD-10-CM | POA: Diagnosis not present

## 2013-03-21 DIAGNOSIS — R269 Unspecified abnormalities of gait and mobility: Secondary | ICD-10-CM | POA: Diagnosis not present

## 2013-03-22 DIAGNOSIS — M6281 Muscle weakness (generalized): Secondary | ICD-10-CM | POA: Diagnosis not present

## 2013-03-22 DIAGNOSIS — I2510 Atherosclerotic heart disease of native coronary artery without angina pectoris: Secondary | ICD-10-CM | POA: Diagnosis not present

## 2013-03-22 DIAGNOSIS — R269 Unspecified abnormalities of gait and mobility: Secondary | ICD-10-CM | POA: Diagnosis not present

## 2013-03-22 DIAGNOSIS — I10 Essential (primary) hypertension: Secondary | ICD-10-CM | POA: Diagnosis not present

## 2013-03-22 DIAGNOSIS — E119 Type 2 diabetes mellitus without complications: Secondary | ICD-10-CM | POA: Diagnosis not present

## 2013-03-22 DIAGNOSIS — IMO0002 Reserved for concepts with insufficient information to code with codable children: Secondary | ICD-10-CM | POA: Diagnosis not present

## 2013-03-26 ENCOUNTER — Encounter (HOSPITAL_COMMUNITY): Payer: Self-pay | Admitting: Emergency Medicine

## 2013-03-26 ENCOUNTER — Emergency Department (HOSPITAL_COMMUNITY)
Admission: EM | Admit: 2013-03-26 | Discharge: 2013-03-27 | Disposition: A | Discharge status: Home / Self Care | Payer: Medicare Other | Attending: Emergency Medicine | Admitting: Emergency Medicine

## 2013-03-26 DIAGNOSIS — F329 Major depressive disorder, single episode, unspecified: Secondary | ICD-10-CM | POA: Diagnosis not present

## 2013-03-26 DIAGNOSIS — I252 Old myocardial infarction: Secondary | ICD-10-CM | POA: Diagnosis not present

## 2013-03-26 DIAGNOSIS — Z794 Long term (current) use of insulin: Secondary | ICD-10-CM | POA: Diagnosis not present

## 2013-03-26 DIAGNOSIS — Z792 Long term (current) use of antibiotics: Secondary | ICD-10-CM | POA: Diagnosis not present

## 2013-03-26 DIAGNOSIS — F3289 Other specified depressive episodes: Secondary | ICD-10-CM | POA: Diagnosis not present

## 2013-03-26 DIAGNOSIS — Z8781 Personal history of (healed) traumatic fracture: Secondary | ICD-10-CM | POA: Diagnosis not present

## 2013-03-26 DIAGNOSIS — I10 Essential (primary) hypertension: Secondary | ICD-10-CM | POA: Insufficient documentation

## 2013-03-26 DIAGNOSIS — F17200 Nicotine dependence, unspecified, uncomplicated: Secondary | ICD-10-CM | POA: Diagnosis not present

## 2013-03-26 DIAGNOSIS — Z79899 Other long term (current) drug therapy: Secondary | ICD-10-CM | POA: Insufficient documentation

## 2013-03-26 DIAGNOSIS — F411 Generalized anxiety disorder: Secondary | ICD-10-CM | POA: Insufficient documentation

## 2013-03-26 DIAGNOSIS — IMO0002 Reserved for concepts with insufficient information to code with codable children: Secondary | ICD-10-CM | POA: Diagnosis not present

## 2013-03-26 DIAGNOSIS — K219 Gastro-esophageal reflux disease without esophagitis: Secondary | ICD-10-CM | POA: Diagnosis not present

## 2013-03-26 DIAGNOSIS — E785 Hyperlipidemia, unspecified: Secondary | ICD-10-CM | POA: Insufficient documentation

## 2013-03-26 DIAGNOSIS — I2510 Atherosclerotic heart disease of native coronary artery without angina pectoris: Secondary | ICD-10-CM | POA: Diagnosis not present

## 2013-03-26 DIAGNOSIS — E119 Type 2 diabetes mellitus without complications: Secondary | ICD-10-CM | POA: Diagnosis not present

## 2013-03-26 DIAGNOSIS — R269 Unspecified abnormalities of gait and mobility: Secondary | ICD-10-CM | POA: Diagnosis not present

## 2013-03-26 DIAGNOSIS — M6281 Muscle weakness (generalized): Secondary | ICD-10-CM | POA: Diagnosis not present

## 2013-03-26 LAB — CBC WITH DIFFERENTIAL/PLATELET
Basophils Absolute: 0.1 10*3/uL (ref 0.0–0.1)
Eosinophils Absolute: 0.2 10*3/uL (ref 0.0–0.7)
Eosinophils Relative: 1 % (ref 0–5)
Hemoglobin: 15.3 g/dL (ref 13.0–17.0)
Lymphs Abs: 3.4 10*3/uL (ref 0.7–4.0)
MCH: 34.9 pg — ABNORMAL HIGH (ref 26.0–34.0)
MCV: 95.4 fL (ref 78.0–100.0)
Monocytes Absolute: 1.2 10*3/uL — ABNORMAL HIGH (ref 0.1–1.0)
Monocytes Relative: 9 % (ref 3–12)
RBC: 4.38 MIL/uL (ref 4.22–5.81)

## 2013-03-26 LAB — COMPREHENSIVE METABOLIC PANEL
AST: 13 U/L (ref 0–37)
Alkaline Phosphatase: 84 U/L (ref 39–117)
BUN: 18 mg/dL (ref 6–23)
Calcium: 9.6 mg/dL (ref 8.4–10.5)
Creatinine, Ser: 0.79 mg/dL (ref 0.50–1.35)
GFR calc Af Amer: >90 mL/min (ref >90)
Glucose, Bld: 175 mg/dL — ABNORMAL HIGH (ref 70–99)
Total Protein: 7.8 g/dL (ref 6.0–8.3)

## 2013-03-26 NOTE — ED Provider Notes (Signed)
CSN: [id number]     Arrival date & time 03/26/13  2233 History   First MD Initiated Contact with Patient 03/26/13 2311     Chief Complaint  Patient presents with  . Hypertension   (Consider location/radiation/quality/duration/timing/severity/associated sxs/prior Treatment) HPI Comments: PT with hx of HTN, CAD, comes in with cc of elevated BP. Pt states thath is pressures at home were elevated in the 200 SBP, and then in the afternoon in the 180 SBP. With the BP elevation, patient denies any chest pain, headaches, nausea, visual complains, numbness, gait instability. Pt has some anxiety hx, and he was going to be home alone tomorrow - so decided to come to the ER to get checked up.  Patient is a 67 y.o. male presenting with hypertension. The history is provided by the patient and medical records.  Hypertension Pertinent negatives include no chest pain, no abdominal pain, no headaches and no shortness of breath.    Past Medical History  Diagnosis Date  . Palpitations   . Hypertension     Benign  . Hyperlipidemia   . Reflux   . Diabetes mellitus without complication     Type 2  . Depression   . 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 Anxiety   . Compression fracture of L2    Past Surgical History  Procedure Laterality Date  . Angioplasty    . Carotid stent insertion     No family history on file. History  Substance Use Topics  . Smoking status: Current Every Day Smoker -- 1.00 packs/day for 45 years    Types: Cigarettes  . Smokeless tobacco: Never Used  . Alcohol Use: No    Review of Systems  Constitutional: Negative for fever, activity change and appetite change.  Eyes: Negative for visual disturbance.  Respiratory: Negative for cough and shortness of breath.   Cardiovascular: Negative for chest pain.  Gastrointestinal: Negative for abdominal pain.  Genitourinary: Negative for dysuria.  Neurological: Negative for headaches.    Allergies  Morphine and related  Home  Medications   Current Outpatient Rx  Name  Route  Sig  Dispense  Refill  . atorvastatin (LIPITOR) 80 MG tablet   Oral   Take 80 mg by mouth daily.         . clonazePAM (KLONOPIN) 0.5 MG tablet   Oral   Take 0.25 mg by mouth 2 (two) times daily as needed for anxiety.         Marland Kitchen glipiZIDE (GLUCOTROL) 10 MG tablet   Oral   Take 10 mg by mouth 2 (two) times daily before a meal.         . HYDROcodone-acetaminophen (NORCO/VICODIN) 5-325 MG per tablet   Oral   Take 1-2 tablets by mouth every 6 (six) hours as needed.   30 tablet   0   . insulin glargine (LANTUS) 100 UNIT/ML injection   Subcutaneous   Inject 14 Units into the skin at bedtime.         Marland Kitchen lisinopril (PRINIVIL,ZESTRIL) 40 MG tablet   Oral   Take 40 mg by mouth daily.         . metFORMIN (GLUCOPHAGE) 1000 MG tablet   Oral   Take 1,000 mg by mouth 2 (two) times daily with a meal.         . metoprolol succinate (TOPROL-XL) 50 MG 24 hr tablet   Oral   Take 1 tablet (50 mg total) by mouth 2 (two) times daily.  60 tablet   6     Take with or immediately following a meal.   . oxyCODONE-acetaminophen (PERCOCET) 5-325 MG per tablet   Oral   Take 2 tablets by mouth every 4 (four) hours as needed.   25 tablet   0   . pantoprazole (PROTONIX) 40 MG tablet   Oral   Take 1 tablet (40 mg total) by mouth daily.   30 tablet   0   . prasugrel (EFFIENT) 10 MG TABS tablet   Oral   Take 10 mg by mouth daily.         . sertraline (ZOLOFT) 100 MG tablet   Oral   Take 150 mg by mouth daily.          Marland Kitchen albuterol (PROVENTIL HFA;VENTOLIN HFA) 108 (90 BASE) MCG/ACT inhaler   Inhalation   Inhale 2 puffs into the lungs every 6 (six) hours as needed for wheezing.         Marland Kitchen levofloxacin (LEVAQUIN) 750 MG tablet   Oral   Take 1 tablet (750 mg total) by mouth daily.   4 tablet   0   . nitroGLYCERIN (NITROSTAT) 0.4 MG SL tablet   Sublingual   Place 0.4 mg under the tongue every 5 (five) minutes as needed  for chest pain.          BP 181/82  Pulse 84  Temp(Src) 98.4 F (36.9 C) (Oral)  Resp 14  Ht 5' 10.5" (1.791 m)  Wt 265 lb (120.203 kg)  BMI 37.47 kg/m2  SpO2 96% Physical Exam  Nursing note and vitals reviewed. Constitutional: He is oriented to person, place, and time. He appears well-developed.  HENT:  Head: Normocephalic and atraumatic.  Eyes: Conjunctivae and EOM are normal. Pupils are equal, round, and reactive to light.  Neck: Normal range of motion. Neck supple.  Cardiovascular: Normal rate and regular rhythm.   Pulmonary/Chest: Effort normal and breath sounds normal.  Abdominal: Soft. Bowel sounds are normal. He exhibits no distension. There is no tenderness. There is no rebound and no guarding.  Neurological: He is alert and oriented to person, place, and time.  Skin: Skin is warm.    ED Course  Procedures (including critical care time) Labs Review Labs Reviewed  CBC WITH DIFFERENTIAL - Abnormal; Notable for the following:    WBC 13.4 (*)    MCH 34.9 (*)    MCHC 36.6 (*)    Neutro Abs 8.6 (*)    Monocytes Absolute 1.2 (*)    All other components within normal limits  COMPREHENSIVE METABOLIC PANEL - Abnormal; Notable for the following:    Glucose, Bld 175 (*)    All other components within normal limits   Imaging Review No results found.  EKG Interpretation    Date/Time:  Tuesday March 26 2013 22:40:50 EST Ventricular Rate:  85 PR Interval:  208 QRS Duration: 94 QT Interval:  380 QTC Calculation: 452 R Axis:   -34 Text Interpretation:  Normal sinus rhythm Left axis deviation Inferior infarct , age undetermined Cannot rule out Anterior infarct , age undetermined Abnormal ECG Confirmed by Rhunette Croft, MD, Leia Coletti ([id number]) on 03/26/2013 11:42:31 PM            MDM  No diagnosis found.  Pt comes in with cc of elevated BP. High BP at home, but reading here are normal, or not terribly elevated. BP 129/78 while i was in the room. Pt was asymptomatic  with his elevated BP. Labs and EKG  screen here not concerning. Asked to keep a log of BP, relax when the BP is being measured and see his doctor in 1 week. Allegedly, he was taken off of his HCTZ recently.  Derwood Kaplan, MD 03/26/13 2351

## 2013-03-26 NOTE — ED Notes (Signed)
Pt states he took BP at home and it was 193/87. Pt states he takes Metoprolol for HTN and denies missing dose today. Pt states he broke L2 approx. 3 weeks ago and wears back brace when walking or sitting up for long periods of time. Pt states RN made a home visit today and checked BP and  BP was in the 180s. Pt states he got into and argument last am and believes that could have spiked BP however Pt states BP has not returned to his normal which he states is around 120/70.  Pt denies pain at this time.

## 2013-03-26 NOTE — ED Notes (Signed)
Pt. reports elevated blood pressure today 202/93 , " feels tired" , respirations unlabored , no nausea or dizziness.

## 2013-03-27 DIAGNOSIS — I2510 Atherosclerotic heart disease of native coronary artery without angina pectoris: Secondary | ICD-10-CM | POA: Diagnosis not present

## 2013-03-27 DIAGNOSIS — I10 Essential (primary) hypertension: Secondary | ICD-10-CM | POA: Diagnosis not present

## 2013-03-27 DIAGNOSIS — E119 Type 2 diabetes mellitus without complications: Secondary | ICD-10-CM | POA: Diagnosis not present

## 2013-03-27 DIAGNOSIS — M6281 Muscle weakness (generalized): Secondary | ICD-10-CM | POA: Diagnosis not present

## 2013-03-27 DIAGNOSIS — R269 Unspecified abnormalities of gait and mobility: Secondary | ICD-10-CM | POA: Diagnosis not present

## 2013-03-27 DIAGNOSIS — IMO0002 Reserved for concepts with insufficient information to code with codable children: Secondary | ICD-10-CM | POA: Diagnosis not present

## 2013-03-28 DIAGNOSIS — I2510 Atherosclerotic heart disease of native coronary artery without angina pectoris: Secondary | ICD-10-CM | POA: Diagnosis not present

## 2013-03-28 DIAGNOSIS — R269 Unspecified abnormalities of gait and mobility: Secondary | ICD-10-CM | POA: Diagnosis not present

## 2013-03-28 DIAGNOSIS — E119 Type 2 diabetes mellitus without complications: Secondary | ICD-10-CM | POA: Diagnosis not present

## 2013-03-28 DIAGNOSIS — M6281 Muscle weakness (generalized): Secondary | ICD-10-CM | POA: Diagnosis not present

## 2013-03-28 DIAGNOSIS — R079 Chest pain, unspecified: Secondary | ICD-10-CM | POA: Diagnosis not present

## 2013-03-28 DIAGNOSIS — IMO0002 Reserved for concepts with insufficient information to code with codable children: Secondary | ICD-10-CM | POA: Diagnosis not present

## 2013-03-28 DIAGNOSIS — I10 Essential (primary) hypertension: Secondary | ICD-10-CM | POA: Diagnosis not present

## 2013-03-29 DIAGNOSIS — I2510 Atherosclerotic heart disease of native coronary artery without angina pectoris: Secondary | ICD-10-CM | POA: Diagnosis not present

## 2013-03-29 DIAGNOSIS — M6281 Muscle weakness (generalized): Secondary | ICD-10-CM | POA: Diagnosis not present

## 2013-03-29 DIAGNOSIS — I10 Essential (primary) hypertension: Secondary | ICD-10-CM | POA: Diagnosis not present

## 2013-03-29 DIAGNOSIS — E119 Type 2 diabetes mellitus without complications: Secondary | ICD-10-CM | POA: Diagnosis not present

## 2013-03-29 DIAGNOSIS — IMO0002 Reserved for concepts with insufficient information to code with codable children: Secondary | ICD-10-CM | POA: Diagnosis not present

## 2013-03-29 DIAGNOSIS — R269 Unspecified abnormalities of gait and mobility: Secondary | ICD-10-CM | POA: Diagnosis not present

## 2013-04-01 DIAGNOSIS — IMO0002 Reserved for concepts with insufficient information to code with codable children: Secondary | ICD-10-CM | POA: Diagnosis not present

## 2013-04-01 DIAGNOSIS — E119 Type 2 diabetes mellitus without complications: Secondary | ICD-10-CM | POA: Diagnosis not present

## 2013-04-01 DIAGNOSIS — I10 Essential (primary) hypertension: Secondary | ICD-10-CM | POA: Diagnosis not present

## 2013-04-01 DIAGNOSIS — I2510 Atherosclerotic heart disease of native coronary artery without angina pectoris: Secondary | ICD-10-CM | POA: Diagnosis not present

## 2013-04-01 DIAGNOSIS — M6281 Muscle weakness (generalized): Secondary | ICD-10-CM | POA: Diagnosis not present

## 2013-04-01 DIAGNOSIS — R269 Unspecified abnormalities of gait and mobility: Secondary | ICD-10-CM | POA: Diagnosis not present

## 2013-04-02 DIAGNOSIS — R269 Unspecified abnormalities of gait and mobility: Secondary | ICD-10-CM | POA: Diagnosis not present

## 2013-04-02 DIAGNOSIS — I10 Essential (primary) hypertension: Secondary | ICD-10-CM | POA: Diagnosis not present

## 2013-04-02 DIAGNOSIS — E119 Type 2 diabetes mellitus without complications: Secondary | ICD-10-CM | POA: Diagnosis not present

## 2013-04-02 DIAGNOSIS — IMO0002 Reserved for concepts with insufficient information to code with codable children: Secondary | ICD-10-CM | POA: Diagnosis not present

## 2013-04-02 DIAGNOSIS — M6281 Muscle weakness (generalized): Secondary | ICD-10-CM | POA: Diagnosis not present

## 2013-04-02 DIAGNOSIS — I2510 Atherosclerotic heart disease of native coronary artery without angina pectoris: Secondary | ICD-10-CM | POA: Diagnosis not present

## 2013-04-03 DIAGNOSIS — I2510 Atherosclerotic heart disease of native coronary artery without angina pectoris: Secondary | ICD-10-CM | POA: Diagnosis not present

## 2013-04-03 DIAGNOSIS — M6281 Muscle weakness (generalized): Secondary | ICD-10-CM | POA: Diagnosis not present

## 2013-04-03 DIAGNOSIS — I10 Essential (primary) hypertension: Secondary | ICD-10-CM | POA: Diagnosis not present

## 2013-04-03 DIAGNOSIS — R269 Unspecified abnormalities of gait and mobility: Secondary | ICD-10-CM | POA: Diagnosis not present

## 2013-04-03 DIAGNOSIS — IMO0002 Reserved for concepts with insufficient information to code with codable children: Secondary | ICD-10-CM | POA: Diagnosis not present

## 2013-04-03 DIAGNOSIS — E119 Type 2 diabetes mellitus without complications: Secondary | ICD-10-CM | POA: Diagnosis not present

## 2013-04-08 DIAGNOSIS — S129XXA Fracture of neck, unspecified, initial encounter: Secondary | ICD-10-CM | POA: Diagnosis not present

## 2013-04-09 DIAGNOSIS — M6281 Muscle weakness (generalized): Secondary | ICD-10-CM | POA: Diagnosis not present

## 2013-04-09 DIAGNOSIS — E119 Type 2 diabetes mellitus without complications: Secondary | ICD-10-CM | POA: Diagnosis not present

## 2013-04-09 DIAGNOSIS — I2510 Atherosclerotic heart disease of native coronary artery without angina pectoris: Secondary | ICD-10-CM | POA: Diagnosis not present

## 2013-04-09 DIAGNOSIS — IMO0002 Reserved for concepts with insufficient information to code with codable children: Secondary | ICD-10-CM | POA: Diagnosis not present

## 2013-04-09 DIAGNOSIS — I10 Essential (primary) hypertension: Secondary | ICD-10-CM | POA: Diagnosis not present

## 2013-04-09 DIAGNOSIS — R269 Unspecified abnormalities of gait and mobility: Secondary | ICD-10-CM | POA: Diagnosis not present

## 2013-04-11 DIAGNOSIS — R269 Unspecified abnormalities of gait and mobility: Secondary | ICD-10-CM | POA: Diagnosis not present

## 2013-04-11 DIAGNOSIS — I2510 Atherosclerotic heart disease of native coronary artery without angina pectoris: Secondary | ICD-10-CM | POA: Diagnosis not present

## 2013-04-11 DIAGNOSIS — I10 Essential (primary) hypertension: Secondary | ICD-10-CM | POA: Diagnosis not present

## 2013-04-11 DIAGNOSIS — IMO0002 Reserved for concepts with insufficient information to code with codable children: Secondary | ICD-10-CM | POA: Diagnosis not present

## 2013-04-11 DIAGNOSIS — M6281 Muscle weakness (generalized): Secondary | ICD-10-CM | POA: Diagnosis not present

## 2013-04-11 DIAGNOSIS — E119 Type 2 diabetes mellitus without complications: Secondary | ICD-10-CM | POA: Diagnosis not present

## 2013-04-17 DIAGNOSIS — R269 Unspecified abnormalities of gait and mobility: Secondary | ICD-10-CM | POA: Diagnosis not present

## 2013-04-17 DIAGNOSIS — I10 Essential (primary) hypertension: Secondary | ICD-10-CM | POA: Diagnosis not present

## 2013-04-17 DIAGNOSIS — M6281 Muscle weakness (generalized): Secondary | ICD-10-CM | POA: Diagnosis not present

## 2013-04-17 DIAGNOSIS — IMO0002 Reserved for concepts with insufficient information to code with codable children: Secondary | ICD-10-CM | POA: Diagnosis not present

## 2013-04-17 DIAGNOSIS — I2510 Atherosclerotic heart disease of native coronary artery without angina pectoris: Secondary | ICD-10-CM | POA: Diagnosis not present

## 2013-04-17 DIAGNOSIS — E119 Type 2 diabetes mellitus without complications: Secondary | ICD-10-CM | POA: Diagnosis not present

## 2013-04-19 DIAGNOSIS — IMO0002 Reserved for concepts with insufficient information to code with codable children: Secondary | ICD-10-CM | POA: Diagnosis not present

## 2013-04-19 DIAGNOSIS — I10 Essential (primary) hypertension: Secondary | ICD-10-CM | POA: Diagnosis not present

## 2013-04-19 DIAGNOSIS — M6281 Muscle weakness (generalized): Secondary | ICD-10-CM | POA: Diagnosis not present

## 2013-04-19 DIAGNOSIS — E119 Type 2 diabetes mellitus without complications: Secondary | ICD-10-CM | POA: Diagnosis not present

## 2013-04-19 DIAGNOSIS — R269 Unspecified abnormalities of gait and mobility: Secondary | ICD-10-CM | POA: Diagnosis not present

## 2013-04-19 DIAGNOSIS — I2510 Atherosclerotic heart disease of native coronary artery without angina pectoris: Secondary | ICD-10-CM | POA: Diagnosis not present

## 2013-04-23 DIAGNOSIS — E119 Type 2 diabetes mellitus without complications: Secondary | ICD-10-CM | POA: Diagnosis not present

## 2013-04-23 DIAGNOSIS — I10 Essential (primary) hypertension: Secondary | ICD-10-CM | POA: Diagnosis not present

## 2013-04-23 DIAGNOSIS — R269 Unspecified abnormalities of gait and mobility: Secondary | ICD-10-CM | POA: Diagnosis not present

## 2013-04-23 DIAGNOSIS — IMO0002 Reserved for concepts with insufficient information to code with codable children: Secondary | ICD-10-CM | POA: Diagnosis not present

## 2013-04-23 DIAGNOSIS — M6281 Muscle weakness (generalized): Secondary | ICD-10-CM | POA: Diagnosis not present

## 2013-04-23 DIAGNOSIS — I2510 Atherosclerotic heart disease of native coronary artery without angina pectoris: Secondary | ICD-10-CM | POA: Diagnosis not present

## 2013-04-25 DIAGNOSIS — I2510 Atherosclerotic heart disease of native coronary artery without angina pectoris: Secondary | ICD-10-CM | POA: Diagnosis not present

## 2013-04-25 DIAGNOSIS — F17200 Nicotine dependence, unspecified, uncomplicated: Secondary | ICD-10-CM | POA: Diagnosis not present

## 2013-04-25 DIAGNOSIS — IMO0002 Reserved for concepts with insufficient information to code with codable children: Secondary | ICD-10-CM | POA: Diagnosis not present

## 2013-04-25 DIAGNOSIS — M6281 Muscle weakness (generalized): Secondary | ICD-10-CM | POA: Diagnosis not present

## 2013-04-25 DIAGNOSIS — I10 Essential (primary) hypertension: Secondary | ICD-10-CM | POA: Diagnosis not present

## 2013-04-25 DIAGNOSIS — E119 Type 2 diabetes mellitus without complications: Secondary | ICD-10-CM | POA: Diagnosis not present

## 2013-04-25 DIAGNOSIS — R269 Unspecified abnormalities of gait and mobility: Secondary | ICD-10-CM | POA: Diagnosis not present

## 2013-04-29 DIAGNOSIS — I2510 Atherosclerotic heart disease of native coronary artery without angina pectoris: Secondary | ICD-10-CM | POA: Diagnosis not present

## 2013-04-29 DIAGNOSIS — E119 Type 2 diabetes mellitus without complications: Secondary | ICD-10-CM | POA: Diagnosis not present

## 2013-04-29 DIAGNOSIS — I10 Essential (primary) hypertension: Secondary | ICD-10-CM | POA: Diagnosis not present

## 2013-04-29 DIAGNOSIS — R269 Unspecified abnormalities of gait and mobility: Secondary | ICD-10-CM | POA: Diagnosis not present

## 2013-04-29 DIAGNOSIS — M6281 Muscle weakness (generalized): Secondary | ICD-10-CM | POA: Diagnosis not present

## 2013-04-29 DIAGNOSIS — IMO0002 Reserved for concepts with insufficient information to code with codable children: Secondary | ICD-10-CM | POA: Diagnosis not present

## 2013-04-30 ENCOUNTER — Other Ambulatory Visit: Payer: Self-pay

## 2013-04-30 DIAGNOSIS — IMO0002 Reserved for concepts with insufficient information to code with codable children: Secondary | ICD-10-CM | POA: Diagnosis not present

## 2013-04-30 MED ORDER — PRASUGREL HCL 10 MG PO TABS: 10.0000 mg | ORAL_TABLET | Freq: Every day | ORAL | Status: DC

## 2013-05-13 DIAGNOSIS — E669 Obesity, unspecified: Secondary | ICD-10-CM | POA: Diagnosis not present

## 2013-05-13 DIAGNOSIS — IMO0002 Reserved for concepts with insufficient information to code with codable children: Secondary | ICD-10-CM | POA: Diagnosis not present

## 2013-05-29 ENCOUNTER — Other Ambulatory Visit: Payer: Self-pay | Admitting: Family Medicine

## 2013-05-29 DIAGNOSIS — R911 Solitary pulmonary nodule: Secondary | ICD-10-CM

## 2013-05-31 ENCOUNTER — Telehealth: Payer: Self-pay | Admitting: Cardiology

## 2013-05-31 NOTE — Telephone Encounter (Signed)
**Note De-Identified [name] Obfuscation** The pt states that he was on Plavix in the past and wants to know if he can take that. Please advise.

## 2013-05-31 NOTE — Telephone Encounter (Signed)
**Note De-identified Mariya Mottley Obfuscation** Please advise 

## 2013-05-31 NOTE — Telephone Encounter (Signed)
New message    Cannot afford effient---want another blood thinner that is cheaper

## 2013-05-31 NOTE — Telephone Encounter (Signed)
Larita FifeLynn, Could you prescribe him Xarelto 20 mg po daily? We usually have some cards here where they can get it for less than 5 dollars a month. If that doesn't work we would have to start him on Coumadine. Aris LotKatarina

## 2013-06-04 ENCOUNTER — Ambulatory Visit
Admission: RE | Admit: 2013-06-04 | Discharge: 2013-06-04 | Disposition: A | Discharge status: Home / Self Care | Payer: Medicare Other | Source: Ambulatory Visit | Attending: Family Medicine | Admitting: Family Medicine

## 2013-06-04 DIAGNOSIS — R911 Solitary pulmonary nodule: Secondary | ICD-10-CM

## 2013-06-04 DIAGNOSIS — J449 Chronic obstructive pulmonary disease, unspecified: Secondary | ICD-10-CM | POA: Diagnosis not present

## 2013-06-05 MED ORDER — CLOPIDOGREL BISULFATE 75 MG PO TABS: 75.0000 mg | ORAL_TABLET | Freq: Every day | ORAL | Status: DC

## 2013-06-05 NOTE — Telephone Encounter (Signed)
Per Dr Delton SeeNelson the pt is advised to stop taking Effient and to start taking Plavix 75 mg daily. The pt verbalized understanding [facility] [address]

## 2013-07-23 ENCOUNTER — Telehealth: Payer: Self-pay | Admitting: *Deleted

## 2013-07-23 NOTE — Telephone Encounter (Signed)
Dr Delton SeeNelson, we received refill request on clonazepam 0.5 mg, can we refill?  [facility] [address], [place]

## 2013-07-24 DIAGNOSIS — F17200 Nicotine dependence, unspecified, uncomplicated: Secondary | ICD-10-CM | POA: Diagnosis not present

## 2013-07-24 DIAGNOSIS — IMO0002 Reserved for concepts with insufficient information to code with codable children: Secondary | ICD-10-CM | POA: Diagnosis not present

## 2013-07-24 DIAGNOSIS — R1310 Dysphagia, unspecified: Secondary | ICD-10-CM | POA: Diagnosis not present

## 2013-07-24 DIAGNOSIS — H269 Unspecified cataract: Secondary | ICD-10-CM | POA: Diagnosis not present

## 2013-07-24 DIAGNOSIS — F341 Dysthymic disorder: Secondary | ICD-10-CM | POA: Diagnosis not present

## 2013-07-24 DIAGNOSIS — I10 Essential (primary) hypertension: Secondary | ICD-10-CM | POA: Diagnosis not present

## 2013-07-24 DIAGNOSIS — E119 Type 2 diabetes mellitus without complications: Secondary | ICD-10-CM | POA: Diagnosis not present

## 2013-07-25 NOTE — Telephone Encounter (Signed)
No, I gave it to him because he didn't have a PCP at the time. However he was supposed to find one.This should be addressed by a PCP. Could you let him know? THank you, Tobias AlexanderKatarina Mozell Hardacre

## 2013-07-25 NOTE — Telephone Encounter (Signed)
Second request came in for clonazepam, route to Dr Delton SeeNelson

## 2013-08-05 NOTE — Telephone Encounter (Signed)
Patient was notified on 07/25/2013

## 2013-08-22 DIAGNOSIS — M545 Low back pain, unspecified: Secondary | ICD-10-CM | POA: Diagnosis not present

## 2013-08-22 DIAGNOSIS — Z6838 Body mass index (BMI) 38.0-38.9, adult: Secondary | ICD-10-CM | POA: Diagnosis not present

## 2013-08-22 DIAGNOSIS — IMO0002 Reserved for concepts with insufficient information to code with codable children: Secondary | ICD-10-CM | POA: Diagnosis not present

## 2013-09-20 DIAGNOSIS — E1139 Type 2 diabetes mellitus with other diabetic ophthalmic complication: Secondary | ICD-10-CM | POA: Diagnosis not present

## 2013-09-20 DIAGNOSIS — H25049 Posterior subcapsular polar age-related cataract, unspecified eye: Secondary | ICD-10-CM | POA: Diagnosis not present

## 2013-09-20 DIAGNOSIS — E11319 Type 2 diabetes mellitus with unspecified diabetic retinopathy without macular edema: Secondary | ICD-10-CM | POA: Diagnosis not present

## 2013-09-20 DIAGNOSIS — H2510 Age-related nuclear cataract, unspecified eye: Secondary | ICD-10-CM | POA: Diagnosis not present

## 2013-09-26 DIAGNOSIS — M25529 Pain in unspecified elbow: Secondary | ICD-10-CM | POA: Diagnosis not present

## 2013-09-26 DIAGNOSIS — R209 Unspecified disturbances of skin sensation: Secondary | ICD-10-CM | POA: Diagnosis not present

## 2013-10-01 ENCOUNTER — Encounter: Payer: Self-pay | Admitting: Cardiology

## 2013-10-01 ENCOUNTER — Other Ambulatory Visit: Payer: Self-pay | Admitting: Cardiology

## 2013-10-07 HISTORY — PX: CATARACT EXTRACTION W/ INTRAOCULAR LENS IMPLANT: SHX1309

## 2013-10-08 DIAGNOSIS — H2510 Age-related nuclear cataract, unspecified eye: Secondary | ICD-10-CM | POA: Diagnosis not present

## 2013-10-08 DIAGNOSIS — H25049 Posterior subcapsular polar age-related cataract, unspecified eye: Secondary | ICD-10-CM | POA: Diagnosis not present

## 2013-10-08 DIAGNOSIS — H2589 Other age-related cataract: Secondary | ICD-10-CM | POA: Diagnosis not present

## 2013-10-30 DIAGNOSIS — F17200 Nicotine dependence, unspecified, uncomplicated: Secondary | ICD-10-CM | POA: Diagnosis not present

## 2013-10-30 DIAGNOSIS — I10 Essential (primary) hypertension: Secondary | ICD-10-CM | POA: Diagnosis not present

## 2013-10-30 DIAGNOSIS — Z23 Encounter for immunization: Secondary | ICD-10-CM | POA: Diagnosis not present

## 2013-10-30 DIAGNOSIS — F341 Dysthymic disorder: Secondary | ICD-10-CM | POA: Diagnosis not present

## 2013-10-30 DIAGNOSIS — E785 Hyperlipidemia, unspecified: Secondary | ICD-10-CM | POA: Diagnosis not present

## 2013-10-30 DIAGNOSIS — Z1211 Encounter for screening for malignant neoplasm of colon: Secondary | ICD-10-CM | POA: Diagnosis not present

## 2013-10-30 DIAGNOSIS — E119 Type 2 diabetes mellitus without complications: Secondary | ICD-10-CM | POA: Diagnosis not present

## 2013-10-30 DIAGNOSIS — J449 Chronic obstructive pulmonary disease, unspecified: Secondary | ICD-10-CM | POA: Diagnosis not present

## 2013-10-30 DIAGNOSIS — I2510 Atherosclerotic heart disease of native coronary artery without angina pectoris: Secondary | ICD-10-CM | POA: Diagnosis not present

## 2013-12-03 ENCOUNTER — Other Ambulatory Visit: Payer: Self-pay | Admitting: Cardiology

## 2013-12-04 ENCOUNTER — Other Ambulatory Visit: Payer: Self-pay

## 2013-12-04 MED ORDER — METOPROLOL SUCCINATE ER 50 MG PO TB24: 50.0000 mg | ORAL_TABLET | Freq: Two times a day (BID) | ORAL | Status: DC

## 2013-12-04 MED ORDER — CLOPIDOGREL BISULFATE 75 MG PO TABS
ORAL_TABLET | ORAL | Status: DC
Start: 2013-12-04 — End: 2014-01-10

## 2013-12-09 ENCOUNTER — Encounter: Payer: Self-pay | Admitting: *Deleted

## 2013-12-17 ENCOUNTER — Encounter: Payer: Self-pay | Admitting: Cardiology

## 2013-12-17 ENCOUNTER — Ambulatory Visit (INDEPENDENT_AMBULATORY_CARE_PROVIDER_SITE_OTHER): Payer: Medicare Other | Admitting: Cardiology

## 2013-12-17 VITALS — BP 118/60 | HR 60 | Ht 70.0 in | Wt 265.8 lb

## 2013-12-17 DIAGNOSIS — I209 Angina pectoris, unspecified: Secondary | ICD-10-CM | POA: Diagnosis not present

## 2013-12-17 DIAGNOSIS — I25118 Atherosclerotic heart disease of native coronary artery with other forms of angina pectoris: Secondary | ICD-10-CM

## 2013-12-17 DIAGNOSIS — I2510 Atherosclerotic heart disease of native coronary artery without angina pectoris: Secondary | ICD-10-CM | POA: Diagnosis not present

## 2013-12-17 MED ORDER — METOPROLOL SUCCINATE ER 25 MG PO TB24: 25.0000 mg | ORAL_TABLET | Freq: Every day | ORAL | Status: DC

## 2013-12-17 NOTE — Patient Instructions (Signed)
Your physician has recommended you make the following change in your medication:   DECREASE YOUR METOPROLOL TO 25 MG DAILY  Your physician wants you to follow-up in: 6 MONTHS WITH DR Johnell ComingsNELSON You will receive a reminder letter in the mail two months in advance. If you don't receive a letter, please call our office to schedule the follow-up appointment.

## 2013-12-17 NOTE — Progress Notes (Signed)
Patient ID: Dwayne [name], male   DOB: 03-10-1946, 68 y.o.   MRN: [id number]     Patient Name: Dwayne [name] Date of Encounter: 12/17/2013  Primary Care Provider:  No PCP Per Patient Primary Cardiologist:  Lars Masson  Patient Profile  Establishing cardiology care after move from [place]  Problem List   Past Medical History  Diagnosis Date  . Palpitations   . Hypertension, benign     Benign  . Hyperlipidemia   . Esophageal reflux   . IDDM (insulin dependent diabetes mellitus)   . Depression   . Past heart attack   . Anxiety   . Compression fracture of L2    Past Surgical History  Procedure Laterality Date  . Angioplasty    . Carotid stent insertion      Allergies  Allergies  Allergen Reactions  . Morphine And Related Swelling    Swelling of the tongue and face    HPI  68 year old male with h/o IDDM, obesity, hypertension, hyperlipidemia, ongoing smoking and CAD who just moved to [facility] and is here to establish cardiology care. 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 He was doing well until 2012 when he had another episode chest pain and received stent to RCA another the next day for in stent restenosis. He was sterted on Prasugrel after that. He describes his pain as reflux but stronger. He states that since the last stent he has been asymptomatic. He is trying to exercise daily and has CP or SOB.    1 year followup - 12/17/2013 - the patient had a back injury in November 2014. He is otherwise doing well no chest pain, no shortness of breath, he walks mainly because of his back injury. He denies any lower extremity edema orthopnea medications. His only complaint is fatigue and sometimes low blood pressure.  Home Medications  Prior to Admission medications   Medication Sig Start Date End Date Taking? Authorizing Provider  albuterol (PROVENTIL) (2.5 MG/3ML) 0.083% nebulizer  solution Take 2.5 mg by nebulization every 6 (six) hours as needed for wheezing.   Yes Historical Provider, MD  atorvastatin (LIPITOR) 80 MG tablet Take 80 mg by mouth daily.   Yes Historical Provider, MD  clonazePAM (KLONOPIN) 0.5 MG tablet Take 0.5 mg by mouth 2 (two) times daily as needed for anxiety.   Yes Historical Provider, MD  GLIPIZIDE ER PO Take 10 mg by mouth 2 (two) times daily.   Yes Historical Provider, MD  hydrochlorothiazide (HYDRODIURIL) 25 MG tablet Take 25 mg by mouth daily.   Yes Historical Provider, MD  Insulin Glargine (LANTUS Spencer) Inject 14 Units into the skin daily.   Yes Historical Provider, MD  lisinopril (PRINIVIL,ZESTRIL) 40 MG tablet Take 40 mg by mouth daily.   Yes Historical Provider, MD  metFORMIN (GLUCOPHAGE) 1000 MG tablet Take 1,000 mg by mouth 2 (two) times daily with a meal.   Yes Historical Provider, MD  metoprolol (LOPRESSOR) 50 MG tablet Take 50 mg by mouth 2 (two) times daily.   Yes Historical Provider, MD  nitroGLYCERIN (NITROSTAT) 0.4 MG SL tablet Place 0.4 mg under the tongue every 5 (five) minutes as needed for chest pain.   Yes Historical Provider, MD  prasugrel (EFFIENT) 10 MG TABS tablet Take 10 mg by mouth daily.   Yes Historical Provider, MD  sertraline (ZOLOFT) 100 MG tablet Take 100 mg by  mouth daily.   Yes Historical Provider, MD    Family History  No family history on file.  Social History  History   Social History  . Marital Status: Married    Spouse Name: N/A    Number of Children: N/A  . Years of Education: N/A   Occupational History  . Not on file.   Social History Main Topics  . Smoking status: Current Every Day Smoker -- 1.00 packs/day for 45 years    Types: Cigarettes  . Smokeless tobacco: Never Used  . Alcohol Use: No  . Drug Use: No  . Sexual Activity: Not on file   Other Topics Concern  . Not on file   Social History Narrative  . No narrative on file     Review of Systems General:  No chills, fever, night  sweats or weight changes.  Cardiovascular:  No chest pain, dyspnea on exertion, edema, orthopnea, palpitations, paroxysmal nocturnal dyspnea. Dermatological: No rash, lesions/masses Respiratory: No cough, dyspnea Urologic: No hematuria, dysuria Abdominal:   No nausea, vomiting, diarrhea, bright red blood per rectum, melena, or hematemesis Neurologic:  No visual changes, wkns, changes in mental status. All other systems reviewed and are otherwise negative except as noted above.  Physical Exam  Blood pressure 118/60, pulse 60, height 5\' 10"  (1.778 m), weight 265 lb 12.8 oz (120.566 kg).  General: Pleasant, NAD, obese Psych: Normal affect. Neuro: Alert and oriented X 3. Moves all extremities spontaneously. HEENT: Normal  Neck: Supple without bruits or JVD. Lungs:  Barrel chest, Resp regular and unlabored, CTA.  Heart: RRR no s3, s4, or murmurs. Abdomen: Soft, non-tender, non-distended, BS + x 4. No bruit above AA  Extremities: No clubbing, cyanosis or edema. DP/PT/Radials 2+ and equal bilaterally.  Accessory Clinical Findings  ECG - SR, 1. AVB, old inferior MI  Assessment & Plan  68 year old male  1. CAD, S/P multiple stening including RCA, diagonal, currently on dual antiplatelet therapy with ASA and prasugrel, we switched to Plavix as it is cheaper. Continue BB, ACE, statin.  2. IDDM -  Well controlled, HbA1c 6.3 %  3. Hypertension - well controlled, sometimes low, and fatigue, decrease  toprol XL to 25 mg PO daily  4. Hyperlipidemia - well controlled on atorvastatin 80 mh QHS, last values in 08/2012 LDL 45, HDL 45, TAG 119, recheck in 6 months   Follow up in 6 months with lipid profile.  Lars MassonNELSON, Lemoyne Scarpati H, MD 12/17/2013, 10:51 AM

## 2014-01-10 ENCOUNTER — Emergency Department (HOSPITAL_COMMUNITY)
Admission: EM | Admit: 2014-01-10 | Discharge: 2014-01-10 | Discharge status: Against Medical Advice | Payer: Medicare Other | Attending: Emergency Medicine | Admitting: Emergency Medicine

## 2014-01-10 ENCOUNTER — Emergency Department (HOSPITAL_COMMUNITY): Payer: Medicare Other

## 2014-01-10 ENCOUNTER — Encounter (HOSPITAL_COMMUNITY): Payer: Self-pay | Admitting: Emergency Medicine

## 2014-01-10 DIAGNOSIS — I10 Essential (primary) hypertension: Secondary | ICD-10-CM | POA: Insufficient documentation

## 2014-01-10 DIAGNOSIS — F411 Generalized anxiety disorder: Secondary | ICD-10-CM | POA: Insufficient documentation

## 2014-01-10 DIAGNOSIS — J449 Chronic obstructive pulmonary disease, unspecified: Secondary | ICD-10-CM | POA: Diagnosis not present

## 2014-01-10 DIAGNOSIS — I517 Cardiomegaly: Secondary | ICD-10-CM | POA: Diagnosis not present

## 2014-01-10 DIAGNOSIS — R079 Chest pain, unspecified: Secondary | ICD-10-CM | POA: Insufficient documentation

## 2014-01-10 DIAGNOSIS — K219 Gastro-esophageal reflux disease without esophagitis: Secondary | ICD-10-CM | POA: Diagnosis not present

## 2014-01-10 DIAGNOSIS — Z8781 Personal history of (healed) traumatic fracture: Secondary | ICD-10-CM | POA: Insufficient documentation

## 2014-01-10 DIAGNOSIS — F3289 Other specified depressive episodes: Secondary | ICD-10-CM | POA: Diagnosis not present

## 2014-01-10 DIAGNOSIS — Z7902 Long term (current) use of antithrombotics/antiplatelets: Secondary | ICD-10-CM | POA: Diagnosis not present

## 2014-01-10 DIAGNOSIS — R002 Palpitations: Secondary | ICD-10-CM | POA: Insufficient documentation

## 2014-01-10 DIAGNOSIS — F329 Major depressive disorder, single episode, unspecified: Secondary | ICD-10-CM | POA: Insufficient documentation

## 2014-01-10 DIAGNOSIS — E785 Hyperlipidemia, unspecified: Secondary | ICD-10-CM | POA: Diagnosis not present

## 2014-01-10 DIAGNOSIS — E119 Type 2 diabetes mellitus without complications: Secondary | ICD-10-CM | POA: Insufficient documentation

## 2014-01-10 DIAGNOSIS — F17200 Nicotine dependence, unspecified, uncomplicated: Secondary | ICD-10-CM | POA: Diagnosis not present

## 2014-01-10 DIAGNOSIS — J9819 Other pulmonary collapse: Secondary | ICD-10-CM | POA: Diagnosis not present

## 2014-01-10 DIAGNOSIS — Z79899 Other long term (current) drug therapy: Secondary | ICD-10-CM | POA: Diagnosis not present

## 2014-01-10 LAB — CBC
HCT: 38.7 % — ABNORMAL LOW (ref 39.0–52.0)
Hemoglobin: 13.5 g/dL (ref 13.0–17.0)
MCH: 33.2 pg (ref 26.0–34.0)
MCHC: 34.9 g/dL (ref 30.0–36.0)
MCV: 95.1 fL (ref 78.0–100.0)
PLATELETS: 215 10*3/uL (ref 150–400)
RBC: 4.07 MIL/uL — ABNORMAL LOW (ref 4.22–5.81)
RDW: 13.4 % (ref 11.5–15.5)
WBC: 10.8 10*3/uL — AB (ref 4.0–10.5)

## 2014-01-10 LAB — BASIC METABOLIC PANEL
ANION GAP: 15 (ref 5–15)
BUN: 19 mg/dL (ref 6–23)
CO2: 27 mEq/L (ref 19–32)
CREATININE: 0.81 mg/dL (ref 0.50–1.35)
Calcium: 8.6 mg/dL (ref 8.4–10.5)
Chloride: 95 mEq/L — ABNORMAL LOW (ref 96–112)
GFR, EST NON AFRICAN AMERICAN: 89 mL/min — AB (ref >90)
Glucose, Bld: 170 mg/dL — ABNORMAL HIGH (ref 70–99)
Potassium: 3.7 mEq/L (ref 3.7–5.3)
Sodium: 137 mEq/L (ref 137–147)

## 2014-01-10 LAB — PROTIME-INR
INR: 0.89 (ref 0.00–1.49)
Prothrombin Time: 12 seconds (ref 11.6–15.2)

## 2014-01-10 LAB — PRO B NATRIURETIC PEPTIDE: Pro B Natriuretic peptide (BNP): 250.3 pg/mL — ABNORMAL HIGH (ref 0–125)

## 2014-01-10 LAB — I-STAT TROPONIN, ED: Troponin i, poc: 0 ng/mL (ref 0.00–0.08)

## 2014-01-10 MED ORDER — LISINOPRIL 20 MG PO TABS: 40.0000 mg | ORAL_TABLET | Freq: Every day | ORAL | Status: DC

## 2014-01-10 MED ORDER — CLONAZEPAM 0.5 MG PO TABS: 0.2500 mg | ORAL_TABLET | Freq: Two times a day (BID) | ORAL | Status: DC | PRN

## 2014-01-10 MED ORDER — SERTRALINE HCL 50 MG PO TABS: 150.0000 mg | ORAL_TABLET | Freq: Every day | ORAL | Status: DC

## 2014-01-10 MED ORDER — METFORMIN HCL 500 MG PO TABS: 1000.0000 mg | ORAL_TABLET | Freq: Two times a day (BID) | ORAL | Status: DC

## 2014-01-10 MED ORDER — NITROGLYCERIN 0.4 MG SL SUBL
0.4000 mg | SUBLINGUAL_TABLET | SUBLINGUAL | Status: DC | PRN
  Administered 2014-01-10: 0.4 mg via SUBLINGUAL
  Filled 2014-01-10: qty 1

## 2014-01-10 MED ORDER — CLOPIDOGREL BISULFATE 75 MG PO TABS: 75.0000 mg | ORAL_TABLET | Freq: Every day | ORAL | Status: DC

## 2014-01-10 MED ORDER — ALBUTEROL SULFATE HFA 108 (90 BASE) MCG/ACT IN AERS: 2.0000 | INHALATION_SPRAY | Freq: Four times a day (QID) | RESPIRATORY_TRACT | Status: DC | PRN

## 2014-01-10 MED ORDER — HYDROCODONE-ACETAMINOPHEN 5-325 MG PO TABS: 1.0000 | ORAL_TABLET | Freq: Two times a day (BID) | ORAL | Status: DC | PRN

## 2014-01-10 MED ORDER — ATORVASTATIN CALCIUM 80 MG PO TABS
80.0000 mg | ORAL_TABLET | Freq: Every day | ORAL | Status: DC
  Filled 2014-01-10: qty 1

## 2014-01-10 MED ORDER — HYDROCHLOROTHIAZIDE 25 MG PO TABS: 25.0000 mg | ORAL_TABLET | Freq: Every day | ORAL | Status: DC

## 2014-01-10 MED ORDER — PANTOPRAZOLE SODIUM 40 MG PO TBEC: 40.0000 mg | DELAYED_RELEASE_TABLET | Freq: Every day | ORAL | Status: DC

## 2014-01-10 MED ORDER — METOPROLOL SUCCINATE ER 25 MG PO TB24
25.0000 mg | ORAL_TABLET | Freq: Two times a day (BID) | ORAL | Status: DC
  Filled 2014-01-10 (×2): qty 1

## 2014-01-10 MED ORDER — GLIPIZIDE 10 MG PO TABS
10.0000 mg | ORAL_TABLET | Freq: Two times a day (BID) | ORAL | Status: DC
  Filled 2014-01-10 (×3): qty 1

## 2014-01-10 NOTE — ED Notes (Signed)
Dr Rhunette Croft and cardiology PA spoke with pt. Pt signing AMA

## 2014-01-10 NOTE — ED Notes (Signed)
EMS - Pt coming from home with c/o of burning central chest pain discomfort with radiation downward toward the stomach.  Pt states he had gotten up because he was hungry and after he ate some carrot cake the chest discomfort started.  Pt took  Aspirin at home.  With EMS pt having multifocal PVCs and brief pauses in EKG rhythm.  Pain is 2/10.

## 2014-01-10 NOTE — ED Notes (Addendum)
Pt wants to sign out AMA-- states will follow up with regular dr. Zada Girt notified-- returned call--

## 2014-01-10 NOTE — ED Notes (Signed)
Dr. Rhunette Croft at bedside.  Levell July

## 2014-01-10 NOTE — H&P (Addendum)
Primary Physician: Primary Cardiologist:  Delton See   HPI: 68 year old male with h/o IDDM, obesity, hypertension, hyperlipidemia, ongoing smoking and CAD who just moved to [facility] and is here to establish cardiology care. 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 He was doing well until 2012 when he had another episode chest pain and received stent to RCA another the next day for in stent restenosis. He was sterted on Prasugrel after that.  Last seen in cardiology clinic on 8/11  Switched to PLavix   Today, woke up.  Hungry, ate a piece of carrot cake    Developed burning in mid chest (unlike MI when discomfort in high chest/neck)  Mild SOB  Laid down in bed  Didn't get better.  Called 911  Patinet has noted more fatigue since fall last year.  No change.  Has had problems with spasm/pain in esophagus with swallowing food/liquids.  Reproted had some GI evaluation.  Did not have this AM THis was different than his reflux  Currently he is symptom free       Past Medical History  Diagnosis Date  . Palpitations   . Hypertension, benign     Benign  . Hyperlipidemia   . Esophageal reflux   . IDDM (insulin dependent diabetes mellitus)   . Depression   . Past heart attack   . Anxiety   . Compression fracture of L2      (Not in a hospital admission)      Infusions:    Allergies  Allergen Reactions  . Morphine And Related Swelling    Swelling of the tongue and face    History   Social History  . Marital Status: Married    Spouse Name: N/A    Number of Children: N/A  . Years of Education: N/A   Occupational History  . Not on file.   Social History Main Topics  . Smoking status: Current Every Day Smoker -- 1.00 packs/day for 45 years    Types: Cigarettes  . Smokeless tobacco: Never Used  . Alcohol Use: No  . Drug Use: No  . Sexual Activity: Not on file   Other Topics Concern  .  Not on file   Social History Narrative  . No narrative on file    No family history on file.  No fhx prem CAD  REVIEW OF SYSTEMS:  All systems reviewed  Negative to the above problem except as noted above.    PHYSICAL EXAM: Filed Vitals:   01/10/14 0745  BP: 160/77  Pulse: 57  Resp: 18    No intake or output data in the 24 hours ending 01/10/14 0753  General:  Well appearing. No respiratory difficulty HEENT: normal Neck: supple. no JVD. Carotids 2+ bilat; no bruits. No lymphadenopathy or thryomegaly appreciated. Cor: PMI nondisplaced. Regular rate & rhythm. No rubs, gallops or murmurs. Lungs: clear Abdomen: soft, nontender, nondistended. No hepatosplenomegaly. No bruits or masses. Good bowel sounds. Extremities: no cyanosis, clubbing, rash, edema Neuro: alert & oriented x 3, cranial nerves grossly intact. moves all 4 extremities w/o difficulty. Affect pleasant.  ECG:  SR 70 bpm  First degree AV block  PR 336 msec    Results for orders placed during the hospital encounter of 01/10/14 (from the past 24 hour(s))  CBC     Status: Abnormal   Collection Time    01/10/14  4:46 AM      Result Value Ref Range   WBC 10.8 (*) 4.0 - 10.5 K/uL   RBC 4.07 (*) 4.22 - 5.81 MIL/uL   Hemoglobin 13.5  13.0 - 17.0 g/dL   HCT 40.9 (*) 81.1 - 91.4 %   MCV 95.1  78.0 - 100.0 fL   MCH 33.2  26.0 - 34.0 pg   MCHC 34.9  30.0 - 36.0 g/dL   RDW 78.2  95.6 - 21.3 %   Platelets 215  150 - 400 K/uL  BASIC METABOLIC PANEL     Status: Abnormal   Collection Time    01/10/14  4:46 AM      Result Value Ref Range   Sodium 137  137 - 147 mEq/L   Potassium 3.7  3.7 - 5.3 mEq/L   Chloride 95 (*) 96 - 112 mEq/L   CO2 27  19 - 32 mEq/L   Glucose, Bld 170 (*) 70 - 99 mg/dL   BUN 19  6 - 23 mg/dL   Creatinine, Ser 0.86  0.50 - 1.35 mg/dL   Calcium 8.6  8.4 - 57.8 mg/dL   GFR calc non Af Amer 89 (*) >90 mL/min   GFR calc Af Amer >90  >90 mL/min   Anion gap 15  5 - 15  PRO B NATRIURETIC PEPTIDE      Status: Abnormal   Collection Time    01/10/14  4:46 AM      Result Value Ref Range   Pro B Natriuretic peptide (BNP) 250.3 (*) 0 - 125 pg/mL  PROTIME-INR     Status: None   Collection Time    01/10/14  4:46 AM      Result Value Ref Range   Prothrombin Time 12.0  11.6 - 15.2 seconds   INR 0.89  0.00 - 1.49  I-STAT TROPOININ, ED     Status: None   Collection Time    01/10/14  5:17 AM      Result Value Ref Range   Troponin i, poc 0.00  0.00 - 0.08 ng/mL   Comment 3            Dg Chest Port 1 View  01/10/2014   CLINICAL DATA:  Chest pain.  EXAM: PORTABLE CHEST - 1 VIEW  COMPARISON:  CT of the chest June 04, 2013 and chest radiograph March 07, 2013  FINDINGS: The cardiac silhouette appears mildly enlarged, mediastinal silhouette is nonsuspicious, mildly calcified aortic knob. Mild chronic interstitial changes, minimal strandy densities right lung base. Increased lung volumes. No pleural effusions or focal consolidations. No pneumothorax. Soft tissue planes and included osseous structures are nonsuspicious. Multiple EKG lines overlie the patient and may obscure subtle underlying pathology.  IMPRESSION: Stable cardiomegaly.  COPD with right lung base atelectasis.   Electronically Signed   By: Awilda Metro   On: 01/10/2014 05:54     ASSESSMENT:  1.  CP   Patinet is a 68 yo with history of CAD  Presents with chest discomfort this AM  Currently symtpom free  Pain not completely like discomfort with angina  Concerning though Given that symtpoms at rest would recomm admission ,  R/O MI   If rules in Cath.  If rules out, myoview.   Not proBNP was minimally elevated  Volume looks OK on exam    2.  Palpitations.  Patient has had some PVCs on tele  Follow  3.  HTN  Follow    4.  GI  SOme swallowing problems  Has had some work up in past  ? Barium swallow in future.    5  HL  Check in AM  6   HL  WIll check lipids in AM  7  DM  Continue meds.  Check A1C

## 2014-01-10 NOTE — ED Notes (Signed)
Dr Otter at bedside  

## 2014-01-10 NOTE — ED Notes (Signed)
Dr. Norlene Campbell made aware pt complaining of burning central chest pain discomfort.  Pt states he is feeling lightheaded and dizzy.  New EKG taken and given to [facility].  See MAR for new orders.

## 2014-01-10 NOTE — Discharge Instructions (Signed)
We saw you in the ER for the chest pain/shortness of breath. All of our cardiac workup is normal, including labs, EKG and chest X-RAY are normal. We wanted to get you admitted for the chest pain. You feel like you will be fine, and so want to go home despite our recommendations.  We are not sure what is causing your discomfort, comes in if there is any chest pain, shortness of breath, sweats, dizziness.   Chest Pain (Nonspecific) It is often hard to give a specific diagnosis for the cause of chest pain. There is always a chance that your pain could be related to something serious, such as a heart attack or a blood clot in the lungs. You need to follow up with your health care provider for further evaluation. CAUSES   Heartburn.  Pneumonia or bronchitis.  Anxiety or stress.  Inflammation around your heart (pericarditis) or lung (pleuritis or pleurisy).  A blood clot in the lung.  A collapsed lung (pneumothorax). It can develop suddenly on its own (spontaneous pneumothorax) or from trauma to the chest.  Shingles infection (herpes zoster virus). The chest wall is composed of bones, muscles, and cartilage. Any of these can be the source of the pain.  The bones can be bruised by injury.  The muscles or cartilage can be strained by coughing or overwork.  The cartilage can be affected by inflammation and become sore (costochondritis). DIAGNOSIS  Lab tests or other studies may be needed to find the cause of your pain. Your health care provider may have you take a test called an ambulatory electrocardiogram (ECG). An ECG records your heartbeat patterns over a 24-hour period. You may also have other tests, such as:  Transthoracic echocardiogram (TTE). During echocardiography, sound waves are used to evaluate how blood flows through your heart.  Transesophageal echocardiogram (TEE).  Cardiac monitoring. This allows your health care provider to monitor your heart rate and rhythm in real  time.  Holter monitor. This is a portable device that records your heartbeat and can help diagnose heart arrhythmias. It allows your health care provider to track your heart activity for several days, if needed.  Stress tests by exercise or by giving medicine that makes the heart beat faster. TREATMENT   Treatment depends on what may be causing your chest pain. Treatment may include:  Acid blockers for heartburn.  Anti-inflammatory medicine.  Pain medicine for inflammatory conditions.  Antibiotics if an infection is present.  You may be advised to change lifestyle habits. This includes stopping smoking and avoiding alcohol, caffeine, and chocolate.  You may be advised to keep your head raised (elevated) when sleeping. This reduces the chance of acid going backward from your stomach into your esophagus. Most of the time, nonspecific chest pain will improve within 2-3 days with rest and mild pain medicine.  HOME CARE INSTRUCTIONS   If antibiotics were prescribed, take them as directed. Finish them even if you start to feel better.  For the next few days, avoid physical activities that bring on chest pain. Continue physical activities as directed.  Do not use any tobacco products, including cigarettes, chewing tobacco, or electronic cigarettes.  Avoid drinking alcohol.  Only take medicine as directed by your health care provider.  Follow your health care provider's suggestions for further testing if your chest pain does not go away.  Keep any follow-up appointments you made. If you do not go to an appointment, you could develop lasting (chronic) problems with pain. If there is  any problem keeping an appointment, call to reschedule. SEEK MEDICAL CARE IF:   Your chest pain does not go away, even after treatment.  You have a rash with blisters on your chest.  You have a fever. SEEK IMMEDIATE MEDICAL CARE IF:   You have increased chest pain or pain that spreads to your arm,  neck, jaw, back, or abdomen.  You have shortness of breath.  You have an increasing cough, or you cough up blood.  You have severe back or abdominal pain.  You feel nauseous or vomit.  You have severe weakness.  You faint.  You have chills. This is an emergency. Do not wait to see if the pain will go away. Get medical help at once. Call your local emergency services (911 in [place] Do not drive yourself to the hospital. MAKE SURE YOU:   Understand these instructions.  Will watch your condition.  Will get help right away if you are not doing well or get worse. Document Released: 02/02/2005 Document Revised: 04/30/2013 Document Reviewed: 11/29/2007 [facility] Patient Information 2015 [place], [place]. This information is not intended to replace advice given to you by your health care provider. Make sure you discuss any questions you have with your health care provider.

## 2014-01-10 NOTE — ED Provider Notes (Signed)
Pt with hx of CAD comes in with atypical chest pain. Dr. Norlene Campbell saw the patient overnight, and patient was awaiting admission per Cards. Pt just now notified us that he is chest pain free, and he would like to go home. He wants outpatient f./u. He suspects that the pain is from GERD, he is due for endoscopy next week.  Patient wants to leave against medical advice. Patient understands that his/her actions will lead to inadequate medical workup, and that he/she is at risk of complications of missed diagnosis, which includes morbidity and mortality.  Patient is demonstrating good capacity to make decision. Patient understands that he/she needs to return to the ER immediately if his/her symptoms get worse.    Derwood Kaplan, MD 01/10/14 1026  Derwood Kaplan, MD 01/10/14 1028

## 2014-01-10 NOTE — ED Provider Notes (Signed)
CSN: [id number]     Arrival date & time 01/10/14  0423 History   First MD Initiated Contact with Patient 01/10/14 0503     Chief Complaint  Patient presents with  . Chest Pain     (Consider location/radiation/quality/duration/timing/severity/associated sxs/prior Treatment) HPI 68 year old male presents to the emergency department from home with complaint of central chest pain discomfort that he describes as burning.  Patient reports he woke this morning and was hungry.  After eating carried take he developed symptoms.  He describes some shortness of breath associated with the burning chest pain.  Patient has had 2 prior heart attacks.  He reports with his heart attacks he had burning pain that went up into his neck.  This pain tonight was slightly different in that it was mid sternum instead of high sternum as were his prior heart attacks.  Patient is asymptomatic at this time.  EMS reports that in route patient was having some worsening of his pain, and noted to have pauses in his rhythm strip and multifocal PVCs.  Patient reports total length of pain lasted about 20 minutes.  Patient took 324 mcg of aspirin.  No other medications in route. Past Medical History  Diagnosis Date  . Palpitations   . Hypertension, benign     Benign  . Hyperlipidemia   . Esophageal reflux   . IDDM (insulin dependent diabetes mellitus)   . Depression   . Past heart attack   . Anxiety   . Compression fracture of L2    Past Surgical History  Procedure Laterality Date  . Angioplasty    . Carotid stent insertion     No family history on file. History  Substance Use Topics  . Smoking status: Current Every Day Smoker -- 1.00 packs/day for 45 years    Types: Cigarettes  . Smokeless tobacco: Never Used  . Alcohol Use: No    Review of Systems  See History of Present Illness; otherwise all other systems are reviewed and negative   Allergies  Morphine and related  Home Medications   Prior to Admission  medications   Medication Sig Start Date End Date Taking? Authorizing Provider  albuterol (PROVENTIL HFA;VENTOLIN HFA) 108 (90 BASE) MCG/ACT inhaler Inhale 2 puffs into the lungs every 6 (six) hours as needed for wheezing.   Yes Historical Provider, MD  atorvastatin (LIPITOR) 80 MG tablet Take 80 mg by mouth daily.   Yes Historical Provider, MD  clonazePAM (KLONOPIN) 0.5 MG tablet Take 0.25-0.5 mg by mouth 2 (two) times daily as needed for anxiety.  01/30/13  Yes Lars Masson, MD  clopidogrel (PLAVIX) 75 MG tablet Take 75 mg by mouth daily.   Yes Historical Provider, MD  glipiZIDE (GLUCOTROL) 10 MG tablet Take 10 mg by mouth 2 (two) times daily before a meal.   Yes Historical Provider, MD  hydrochlorothiazide (HYDRODIURIL) 25 MG tablet Take 25 mg by mouth daily.   Yes Historical Provider, MD  HYDROcodone-acetaminophen (NORCO/VICODIN) 5-325 MG per tablet Take 1 tablet by mouth 2 (two) times daily as needed for moderate pain.   Yes Historical Provider, MD  lisinopril (PRINIVIL,ZESTRIL) 40 MG tablet Take 40 mg by mouth daily.   Yes Historical Provider, MD  metFORMIN (GLUCOPHAGE) 1000 MG tablet Take 1,000 mg by mouth 2 (two) times daily with a meal.   Yes Historical Provider, MD  metoprolol succinate (TOPROL-XL) 25 MG 24 hr tablet Take 25 mg by mouth 2 (two) times daily.   Yes Historical Provider, MD  pantoprazole (PROTONIX) 40 MG tablet Take 1 tablet (40 mg total) by mouth daily. 03/11/13  Yes Joseph Art, DO  sertraline (ZOLOFT) 100 MG tablet Take 150 mg by mouth daily.    Yes Historical Provider, MD   BP 134/60  Pulse 73  Resp 24  Ht  (1.778 m)  Wt 261 lb (118.389 kg)  BMI 37.45 kg/m2  SpO2 95% Physical Exam  Nursing note and vitals reviewed. Constitutional: He is oriented to person, place, and time. He appears well-developed and well-nourished.  HENT:  Head: Normocephalic and atraumatic.  Nose: Nose normal.  Mouth/Throat: Oropharynx is clear and moist.  Eyes: Conjunctivae and  EOM are normal. Pupils are equal, round, and reactive to light.  Neck: Normal range of motion. Neck supple. No JVD present. No tracheal deviation present. No thyromegaly present.  Cardiovascular: Normal rate, regular rhythm, normal heart sounds and intact distal pulses.  Exam reveals no gallop and no friction rub.   No murmur heard. Pulmonary/Chest: Effort normal and breath sounds normal. No stridor. No respiratory distress. He has no wheezes. He has no rales. He exhibits no tenderness.  Abdominal: Soft. Bowel sounds are normal. He exhibits no distension and no mass. There is no tenderness. There is no rebound and no guarding.  Musculoskeletal: Normal range of motion. He exhibits edema (trace bilaterally). He exhibits no tenderness.  Lymphadenopathy:    He has no cervical adenopathy.  Neurological: He is alert and oriented to person, place, and time. He exhibits normal muscle tone. Coordination normal.  Skin: Skin is warm and dry. No rash noted. No erythema. No pallor.  Psychiatric: He has a normal mood and affect. His behavior is normal. Judgment and thought content normal.    ED Course  Procedures (including critical care time) Labs Review Labs Reviewed  CBC - Abnormal; Notable for the following:    WBC 10.8 (*)    RBC 4.07 (*)    HCT 38.7 (*)    All other components within normal limits  BASIC METABOLIC PANEL - Abnormal; Notable for the following:    Chloride 95 (*)    Glucose, Bld 170 (*)    GFR calc non Af Amer 89 (*)    All other components within normal limits  PRO B NATRIURETIC PEPTIDE - Abnormal; Notable for the following:    Pro B Natriuretic peptide (BNP) 250.3 (*)    All other components within normal limits  PROTIME-INR  Rosezena Sensor, ED    Imaging Review Dg Chest Port 1 View  01/10/2014   CLINICAL DATA:  Chest pain.  EXAM: PORTABLE CHEST - 1 VIEW  COMPARISON:  CT of the chest June 04, 2013 and chest radiograph March 07, 2013  FINDINGS: The cardiac silhouette  appears mildly enlarged, mediastinal silhouette is nonsuspicious, mildly calcified aortic knob. Mild chronic interstitial changes, minimal strandy densities right lung base. Increased lung volumes. No pleural effusions or focal consolidations. No pneumothorax. Soft tissue planes and included osseous structures are nonsuspicious. Multiple EKG lines overlie the patient and may obscure subtle underlying pathology.  IMPRESSION: Stable cardiomegaly.  COPD with right lung base atelectasis.   Electronically Signed   By: Awilda Metro   On: 01/10/2014 05:54     EKG Interpretation   Date/Time:  Friday January 10 2014 04:35:19 EDT Ventricular Rate:  70 PR Interval:  336 QRS Duration: 90 QT Interval:  416 QTC Calculation: 449 R Axis:   -46 Text Interpretation:  Sinus rhythm Prolonged PR interval Inferior infarct,  old Confirmed by Adrinne Sze  MD, Zakiah Gauthreaux ([id number]) on 01/10/2014 5:10:15 AM      MDM   Final diagnoses:  Chest pain with high risk for cardiac etiology   68 year old male, diabetic hypertensive with known coronary disease status post 2 stents who presents with burning chest pain to mid sternum.  Patient had burning pain to his upper sternum with 2 prior MIs.  His last heart catheterization was December 2013 when he had 2 drug-eluting stents placed.  Patient on aspirin and Plavix.  We'll discuss with cardiology for their evaluation.  5:53 AM Initial troponin is negative, EKG without ST elevation.  Patient reports return of pain.  Will give nitroglycerin  6:45 AM Pt reports resolution of pain with ntg.  Awaiting cardiology evaluation.  Olivia Mackie, MD 01/10/14 [phone]

## 2014-01-20 ENCOUNTER — Encounter: Payer: Self-pay | Admitting: Cardiology

## 2014-01-20 ENCOUNTER — Ambulatory Visit (INDEPENDENT_AMBULATORY_CARE_PROVIDER_SITE_OTHER): Payer: Medicare Other | Admitting: Cardiology

## 2014-01-20 VITALS — BP 136/58 | HR 66 | Ht 70.0 in | Wt 267.8 lb

## 2014-01-20 DIAGNOSIS — I2510 Atherosclerotic heart disease of native coronary artery without angina pectoris: Secondary | ICD-10-CM | POA: Diagnosis not present

## 2014-01-20 DIAGNOSIS — I209 Angina pectoris, unspecified: Secondary | ICD-10-CM | POA: Diagnosis not present

## 2014-01-20 DIAGNOSIS — R079 Chest pain, unspecified: Secondary | ICD-10-CM | POA: Diagnosis not present

## 2014-01-20 MED ORDER — NITROGLYCERIN 0.4 MG SL SUBL: 0.4000 mg | SUBLINGUAL_TABLET | SUBLINGUAL | Status: DC | PRN

## 2014-01-20 NOTE — Patient Instructions (Signed)
**Note De-identified Esmae Donathan Obfuscation** Your physician recommends that you continue on your current medications as directed. Please refer to the Current Medication list given to you today.  Your physician has requested that you have a lexiscan myoview. For further information please visit [email or web address]. Please follow instruction sheet, as given.  Your physician wants you to follow-up in: 6 months You will receive a reminder letter in the mail two months in advance. If you don't receive a letter, please call our office to schedule the follow-up appointment.   

## 2014-01-20 NOTE — Progress Notes (Signed)
Patient ID: Dwayne [name], male   DOB: 06-30-1945, 68 y.o.   MRN: [id number]     Patient Name: Dwayne [name] Date of Encounter: 01/20/2014  Primary Care Provider:  No PCP Per Patient Primary Cardiologist:  Lars Masson  Patient Profile  Establishing cardiology care after move from [place]  Problem List   Past Medical History  Diagnosis Date  . Palpitations   . Hypertension, benign     Benign  . Hyperlipidemia   . Esophageal reflux   . IDDM (insulin dependent diabetes mellitus)   . Depression   . Past heart attack   . Anxiety   . Compression fracture of L2    Past Surgical History  Procedure Laterality Date  . Angioplasty    . Carotid stent insertion      Allergies  Allergies  Allergen Reactions  . Morphine And Related Swelling    Swelling of the tongue and face    HPI  68 year old male with h/o IDDM, obesity, hypertension, hyperlipidemia, ongoing smoking and CAD who just moved to [facility] and is here to establish cardiology care. 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 He was doing well until 2012 when he had another episode chest pain and received stent to RCA another the next day for in stent restenosis. He was sterted on Prasugrel after that. He describes his pain as reflux but stronger. He states that since the last stent he has been asymptomatic. He is trying to exercise daily and has CP or SOB.    1 year followup - 12/17/2013 - the patient had a back injury in November 2014. He is otherwise doing well no chest pain, no shortness of breath, he walks mainly because of his back injury. He denies any lower extremity edema orthopnea medications. His only complaint is fatigue and sometimes low blood pressure.  This is post hospital visit, the patient went to the ER on 01/10/14 with atypical resting epigastric pain - 2 episodes. It terminated in the ER, possibly due to NTG given. No  more episodes since then. He is planning to schedule an EGD. Stable DOE, no syncope.  Home Medications  Prior to Admission medications   Medication Sig Start Date End Date Taking? Authorizing Provider  albuterol (PROVENTIL) (2.5 MG/3ML) 0.083% nebulizer solution Take 2.5 mg by nebulization every 6 (six) hours as needed for wheezing.   Yes Historical Provider, MD  atorvastatin (LIPITOR) 80 MG tablet Take 80 mg by mouth daily.   Yes Historical Provider, MD  clonazePAM (KLONOPIN) 0.5 MG tablet Take 0.5 mg by mouth 2 (two) times daily as needed for anxiety.   Yes Historical Provider, MD  GLIPIZIDE ER PO Take 10 mg by mouth 2 (two) times daily.   Yes Historical Provider, MD  hydrochlorothiazide (HYDRODIURIL) 25 MG tablet Take 25 mg by mouth daily.   Yes Historical Provider, MD  Insulin Glargine (LANTUS ) Inject 14 Units into the skin daily.   Yes Historical Provider, MD  lisinopril (PRINIVIL,ZESTRIL) 40 MG tablet Take 40 mg by mouth daily.   Yes Historical Provider, MD  metFORMIN (GLUCOPHAGE) 1000 MG tablet Take 1,000 mg by mouth 2 (two) times daily with a meal.   Yes Historical Provider, MD  metoprolol (LOPRESSOR) 50 MG tablet Take 50 mg by mouth 2 (two) times daily.   Yes Historical Provider, MD  nitroGLYCERIN (NITROSTAT) 0.4 MG SL tablet Place 0.4  mg under the tongue every 5 (five) minutes as needed for chest pain.   Yes Historical Provider, MD  prasugrel (EFFIENT) 10 MG TABS tablet Take 10 mg by mouth daily.   Yes Historical Provider, MD  sertraline (ZOLOFT) 100 MG tablet Take 100 mg by mouth daily.   Yes Historical Provider, MD    Family History  No family history on file.  Social History  History   Social History  . Marital Status: Married    Spouse Name: N/A    Number of Children: N/A  . Years of Education: N/A   Occupational History  . Not on file.   Social History Main Topics  . Smoking status: Current Every Day Smoker -- 1.00 packs/day for 45 years    Types: Cigarettes    . Smokeless tobacco: Never Used  . Alcohol Use: No  . Drug Use: No  . Sexual Activity: Not on file   Other Topics Concern  . Not on file   Social History Narrative  . No narrative on file     Review of Systems General:  No chills, fever, night sweats or weight changes.  Cardiovascular:  No chest pain, dyspnea on exertion, edema, orthopnea, palpitations, paroxysmal nocturnal dyspnea. Dermatological: No rash, lesions/masses Respiratory: No cough, dyspnea Urologic: No hematuria, dysuria Abdominal:   No nausea, vomiting, diarrhea, bright red blood per rectum, melena, or hematemesis Neurologic:  No visual changes, wkns, changes in mental status. All other systems reviewed and are otherwise negative except as noted above.  Physical Exam  Blood pressure 136/58, pulse 66, height  (1.778 m), weight 267 lb 12.8 oz (121.473 kg), SpO2 97.00%.  General: Pleasant, NAD, obese Psych: Normal affect. Neuro: Alert and oriented X 3. Moves all extremities spontaneously. HEENT: Normal  Neck: Supple without bruits or JVD. Lungs:  Barrel chest, Resp regular and unlabored, CTA.  Heart: RRR no s3, s4, or murmurs. Abdomen: Soft, non-tender, non-distended, BS + x 4. No bruit above AA  Extremities: No clubbing, cyanosis or edema. DP/PT/Radials 2+ and equal bilaterally.  Accessory Clinical Findings  ECG - SR, 1. AVB, old inferior MI  Assessment & Plan  68 year old male  1. CAD, S/P multiple stening including RCA, diagonal, currently on dual antiplatelet therapy with ASA and prasugrel, we switched to Plavix as it is cheaper. Continue BB, ACE, statin. Now new epigastric/chest pain, ECG unchanged, however resolved with NTG. We will schedule a Lexiscan nuclear stress test.  2. IDDM -  Well controlled, HbA1c 6.3 %  3. Hypertension - well controlled, sometimes low, and fatigue, decrease  toprol XL to 25 mg PO daily  4. Hyperlipidemia - well controlled on atorvastatin 80 mh QHS, last values in  08/2012 LDL 45, HDL 45, TAG 119, recheck in 6 months   Follow up in 6 months if normal stress test.  Lars Masson, MD 01/20/2014, 10:30 AM

## 2014-01-23 ENCOUNTER — Encounter (HOSPITAL_COMMUNITY): Payer: Self-pay

## 2014-01-27 DIAGNOSIS — I10 Essential (primary) hypertension: Secondary | ICD-10-CM | POA: Diagnosis not present

## 2014-01-27 DIAGNOSIS — K219 Gastro-esophageal reflux disease without esophagitis: Secondary | ICD-10-CM | POA: Diagnosis not present

## 2014-01-27 DIAGNOSIS — E119 Type 2 diabetes mellitus without complications: Secondary | ICD-10-CM | POA: Diagnosis not present

## 2014-01-27 DIAGNOSIS — F411 Generalized anxiety disorder: Secondary | ICD-10-CM | POA: Diagnosis not present

## 2014-01-27 DIAGNOSIS — F17200 Nicotine dependence, unspecified, uncomplicated: Secondary | ICD-10-CM | POA: Diagnosis not present

## 2014-01-27 DIAGNOSIS — E785 Hyperlipidemia, unspecified: Secondary | ICD-10-CM | POA: Diagnosis not present

## 2014-01-29 ENCOUNTER — Encounter (HOSPITAL_COMMUNITY): Payer: Self-pay

## 2014-01-31 ENCOUNTER — Other Ambulatory Visit: Payer: Self-pay | Admitting: Gastroenterology

## 2014-01-31 DIAGNOSIS — R1310 Dysphagia, unspecified: Secondary | ICD-10-CM

## 2014-01-31 DIAGNOSIS — I10 Essential (primary) hypertension: Secondary | ICD-10-CM | POA: Diagnosis not present

## 2014-01-31 DIAGNOSIS — K219 Gastro-esophageal reflux disease without esophagitis: Secondary | ICD-10-CM | POA: Diagnosis not present

## 2014-01-31 DIAGNOSIS — E119 Type 2 diabetes mellitus without complications: Secondary | ICD-10-CM | POA: Diagnosis not present

## 2014-01-31 DIAGNOSIS — E785 Hyperlipidemia, unspecified: Secondary | ICD-10-CM | POA: Diagnosis not present

## 2014-01-31 DIAGNOSIS — J449 Chronic obstructive pulmonary disease, unspecified: Secondary | ICD-10-CM | POA: Diagnosis not present

## 2014-02-03 ENCOUNTER — Other Ambulatory Visit: Payer: Self-pay | Admitting: Cardiology

## 2014-02-04 ENCOUNTER — Ambulatory Visit (HOSPITAL_COMMUNITY): Payer: Medicare Other | Attending: Cardiology | Admitting: Radiology

## 2014-02-04 VITALS — BP 129/76 | Ht 70.0 in | Wt 268.0 lb

## 2014-02-04 DIAGNOSIS — R002 Palpitations: Secondary | ICD-10-CM | POA: Insufficient documentation

## 2014-02-04 DIAGNOSIS — J449 Chronic obstructive pulmonary disease, unspecified: Secondary | ICD-10-CM | POA: Diagnosis not present

## 2014-02-04 DIAGNOSIS — R9439 Abnormal result of other cardiovascular function study: Secondary | ICD-10-CM | POA: Diagnosis not present

## 2014-02-04 DIAGNOSIS — R5381 Other malaise: Secondary | ICD-10-CM | POA: Diagnosis not present

## 2014-02-04 DIAGNOSIS — J4489 Other specified chronic obstructive pulmonary disease: Secondary | ICD-10-CM | POA: Diagnosis not present

## 2014-02-04 DIAGNOSIS — I2510 Atherosclerotic heart disease of native coronary artery without angina pectoris: Secondary | ICD-10-CM | POA: Diagnosis not present

## 2014-02-04 DIAGNOSIS — R5383 Other fatigue: Secondary | ICD-10-CM

## 2014-02-04 DIAGNOSIS — Z951 Presence of aortocoronary bypass graft: Secondary | ICD-10-CM | POA: Diagnosis not present

## 2014-02-04 DIAGNOSIS — R0602 Shortness of breath: Secondary | ICD-10-CM | POA: Diagnosis not present

## 2014-02-04 DIAGNOSIS — R0609 Other forms of dyspnea: Secondary | ICD-10-CM | POA: Insufficient documentation

## 2014-02-04 DIAGNOSIS — E119 Type 2 diabetes mellitus without complications: Secondary | ICD-10-CM | POA: Diagnosis not present

## 2014-02-04 DIAGNOSIS — I10 Essential (primary) hypertension: Secondary | ICD-10-CM | POA: Insufficient documentation

## 2014-02-04 DIAGNOSIS — R0989 Other specified symptoms and signs involving the circulatory and respiratory systems: Secondary | ICD-10-CM | POA: Diagnosis not present

## 2014-02-04 DIAGNOSIS — R109 Unspecified abdominal pain: Secondary | ICD-10-CM | POA: Diagnosis not present

## 2014-02-04 DIAGNOSIS — R079 Chest pain, unspecified: Secondary | ICD-10-CM | POA: Insufficient documentation

## 2014-02-04 MED ORDER — TECHNETIUM TC 99M SESTAMIBI GENERIC - CARDIOLITE
11.0000 | Freq: Once | INTRAVENOUS | Status: AC | PRN
  Administered 2014-02-04: 11 via INTRAVENOUS

## 2014-02-04 MED ORDER — AMINOPHYLLINE 25 MG/ML IV SOLN
75.0000 mg | Freq: Once | INTRAVENOUS | Status: AC
  Administered 2014-02-04: 75 mg via INTRAVENOUS

## 2014-02-04 MED ORDER — TECHNETIUM TC 99M SESTAMIBI GENERIC - CARDIOLITE
33.0000 | Freq: Once | INTRAVENOUS | Status: AC | PRN
  Administered 2014-02-04: 33 via INTRAVENOUS

## 2014-02-04 MED ORDER — REGADENOSON 0.4 MG/5ML IV SOLN
0.4000 mg | Freq: Once | INTRAVENOUS | Status: AC
  Administered 2014-02-04: 0.4 mg via INTRAVENOUS

## 2014-02-04 NOTE — Progress Notes (Addendum)
[facility] SITE 3 NUCLEAR MED [address] [place], [place] [address] [phone]    Cardiology Nuclear Med Study  Dwayne [name] is a 69 y.o. male     MRN : [id number]     DOB: [birth year]  Procedure Date: 02/04/2014  Nuclear Med Background Indication for Stress Test:  Evaluation for Ischemia, Stent Patency, and Patient seen in hospital on 01-10-2014 for Chest/Stomach Pain, Enzymes negative History:  COPD and CAD, 9/04 MPI: [place]  OK per pt  Cardiac Risk Factors: Hypertension and IDDM   Symptoms:  Chest Pain, DOE, Fatigue, Palpitations and SOB   Nuclear Pre-Procedure Caffeine/Decaff Intake:  None> 12 hrs NPO After: 10:30pm   Lungs:  clear O2 Sat: 98% on room air. IV 0.9% NS with Angio Cath:  22g  IV Site: R Antecubital x 1, tolerated well IV Started by:  Dwayne Hong, RN  Chest Size (in):  48 Cup Size: n/a  Height: 5\' 10"  (1.778 m)  Weight:  268 lb (121.564 kg)  BMI:  Body mass index is 38.45 kg/(m^2). Tech Comments:  Patient took Toprol, and Klonopin, but no diabetic po medication this am. Dwayne Hong, RN. Aminophylline 75 mg IV given for symptoms. All were resolved before leaving. S.Williams EMTP    Nuclear Med Study 1 or 2 day study: 1 day  Stress Test Type:  Lexiscan  Reading MD: N/A  Order Authorizing Provider:  Tobias Alexander, MD  Resting Radionuclide: Technetium 28m Sestamibi  Resting Radionuclide Dose: 11.0 mCi   Stress Radionuclide:  Technetium 77m Sestamibi  Stress Radionuclide Dose: 33.0 mCi           Stress Protocol Rest HR: 56 Stress HR: 80  Rest BP: 129/76 Stress BP: 159/72  Exercise Time (min): n/a METS: n/a   Predicted Max HR: 152 bpm % Max HR: 52.63 bpm Rate Pressure Product: 21308   Dose of Adenosine (mg):  n/a Dose of Lexiscan: 0.4 mg  Dose of Atropine (mg): n/a Dose of Dobutamine: n/a mcg/kg/min (at max HR)  Stress Test Technologist: Dwayne [name], EMT-P  Nuclear Technologist:  Dwayne [name], CNMT     Rest Procedure:   Myocardial perfusion imaging was performed at rest 45 minutes following the intravenous administration of Technetium 85m Sestamibi. Rest ECG: NSR - Normal EKG  Stress Procedure:  The patient received IV Lexiscan 0.4 mg over 15-seconds.  Technetium 59m Sestamibi injected at 30-seconds. This patient has sob, abdominal cramps, headache, chest and eyes ache with the Lexiscan injection. Quantitative spect images were obtained after a 45 minute delay. Stress ECG: No significant change from baseline ECG  QPS Raw Data Images:  Normal; no motion artifact; normal heart/lung ratio. Stress Images:  There is decreased uptake in the inferior wall. Rest Images:  There is decreased uptake in the inferior wall. Subtraction (SDS):  These findings are consistent with ischemia. Transient Ischemic Dilatation (Normal <1.22):  1.38 Lung/Heart Ratio (Normal <0.45):  0.34  Quantitative Gated Spect Images QGS EDV:  122 ml QGS ESV:  49 ml  Impression Exercise Capacity:  Lexiscan with no exercise. BP Response:  Normal blood pressure response. Clinical Symptoms:  Numerous symptoms ECG Impression:  No significant ECG changes with Lexiscan. Comparison with Prior Nuclear Study: No previous nuclear study performed  Overall Impression:  High risk stress nuclear study with moderate sized (16%) partially reversible (SDS 8) inferior ischemia. Abnormal TID ratio of 1.38.  LV Ejection Fraction: 59%.  LV Wall Motion:  Normal Wall Motion  Chrystie Nose, MD, [facility]  Board Certified in Nuclear Cardiology Attending Cardiologist [facility]

## 2014-02-05 ENCOUNTER — Telehealth: Payer: Self-pay | Admitting: *Deleted

## 2014-02-05 ENCOUNTER — Encounter: Payer: Self-pay | Admitting: *Deleted

## 2014-02-05 DIAGNOSIS — I25709 Atherosclerosis of coronary artery bypass graft(s), unspecified, with unspecified angina pectoris: Secondary | ICD-10-CM

## 2014-02-05 DIAGNOSIS — I10 Essential (primary) hypertension: Secondary | ICD-10-CM

## 2014-02-05 DIAGNOSIS — Z01810 Encounter for preprocedural cardiovascular examination: Secondary | ICD-10-CM

## 2014-02-05 MED ORDER — PRASUGREL HCL 10 MG PO TABS
10.0000 mg | ORAL_TABLET | Freq: Every day | ORAL | Status: DC
Start: 2014-02-05 — End: 2014-02-24

## 2014-02-05 NOTE — Telephone Encounter (Signed)
Pt is aware to stop taking Plavix and start Effient 10 mg once daily. 28 sample effient 10 mg pills were  placed to the front desk for pt. Lab  appointment was changed for tomorrow 02/06/14 also pt is aware.

## 2014-02-05 NOTE — Telephone Encounter (Signed)
LMTCB to reinforce to pt that he needs to stop taking his plavix and start taking Effient 10 mg po daily.

## 2014-02-05 NOTE — Telephone Encounter (Signed)
Per Dr Delton SeeNelson this patient needs a cath - left sided soon. Also he needs to go back n Prasugrel - not Plavix. We have recent labs, so we don't really need to oredr anything unless cath labs requires week prior BMP and INR. Thank you,

## 2014-02-05 NOTE — Telephone Encounter (Signed)
Message copied by Loa SocksMARTIN, IVY M on Wed Feb 05, 2014  3:16 PM ------      Message from: Henrietta DineYOUNT, TONYA M      Created: Wed Feb 05, 2014  7:51 AM      Regarding: High Risk Nuclear Study       Hey Dr Delton SeeNelson,            Just wanted to let you know that your patient Dwayne [name] had his nuclear study yesterday (02/04/14) and it was interpreted as high risk for ischemia.  I have already forwarded the report to you!              Thanks! ------

## 2014-02-05 NOTE — Telephone Encounter (Signed)
Pt is scheduled for left cardiac cath  and cors on 02/12/14 at 10:00 Am pt to arrive at the cath lab at 8:00 AM. Labs to be done on Monday 02/10/14. Pt is aware. Instructions for cardiac cath placed at the front desk for pt when he comes on Monday for labs. Pt is aware.

## 2014-02-06 ENCOUNTER — Other Ambulatory Visit: Payer: Self-pay

## 2014-02-06 ENCOUNTER — Encounter (HOSPITAL_COMMUNITY): Payer: Self-pay

## 2014-02-06 ENCOUNTER — Other Ambulatory Visit (INDEPENDENT_AMBULATORY_CARE_PROVIDER_SITE_OTHER): Payer: Medicare Other | Admitting: *Deleted

## 2014-02-06 DIAGNOSIS — I25709 Atherosclerosis of coronary artery bypass graft(s), unspecified, with unspecified angina pectoris: Secondary | ICD-10-CM | POA: Diagnosis not present

## 2014-02-06 DIAGNOSIS — I10 Essential (primary) hypertension: Secondary | ICD-10-CM

## 2014-02-06 DIAGNOSIS — Z01810 Encounter for preprocedural cardiovascular examination: Secondary | ICD-10-CM

## 2014-02-06 LAB — BASIC METABOLIC PANEL
BUN: 21 mg/dL (ref 6–23)
CO2: 31 mEq/L (ref 19–32)
Calcium: 9.4 mg/dL (ref 8.4–10.5)
Chloride: 96 mEq/L (ref 96–112)
Creatinine, Ser: 1 mg/dL (ref 0.4–1.5)
GFR: 77.12 mL/min (ref >60.00)
Glucose, Bld: 143 mg/dL — ABNORMAL HIGH (ref 70–99)
Potassium: 4.1 mEq/L (ref 3.5–5.1)
Sodium: 133 mEq/L — ABNORMAL LOW (ref 135–145)

## 2014-02-06 LAB — CBC WITH DIFFERENTIAL/PLATELET
Basophils Absolute: 0.1 10*3/uL (ref 0.0–0.1)
Basophils Relative: 0.5 % (ref 0.0–3.0)
Eosinophils Absolute: 0.1 10*3/uL (ref 0.0–0.7)
Eosinophils Relative: 1 % (ref 0.0–5.0)
HCT: 42.4 % (ref 39.0–52.0)
Hemoglobin: 14.6 g/dL (ref 13.0–17.0)
Lymphocytes Relative: 27.4 % (ref 12.0–46.0)
Lymphs Abs: 3 10*3/uL (ref 0.7–4.0)
MCHC: 34.4 g/dL (ref 30.0–36.0)
MCV: 98.3 fl (ref 78.0–100.0)
Monocytes Absolute: 0.8 10*3/uL (ref 0.1–1.0)
Monocytes Relative: 7.6 % (ref 3.0–12.0)
Neutro Abs: 7 10*3/uL (ref 1.4–7.7)
Neutrophils Relative %: 63.5 % (ref 43.0–77.0)
Platelets: 265 10*3/uL (ref 150.0–400.0)
RBC: 4.31 Mil/uL (ref 4.22–5.81)
RDW: 14.2 % (ref 11.5–15.5)
WBC: 11.1 10*3/uL — ABNORMAL HIGH (ref 4.0–10.5)

## 2014-02-06 LAB — PROTIME-INR
INR: 1 ratio (ref 0.8–1.0)
Prothrombin Time: 11.1 s (ref 9.6–13.1)

## 2014-02-06 NOTE — Addendum Note (Signed)
Addended by: Lars MassonNELSON, Tehya Leath H on: 02/06/2014 09:58 AM   Modules accepted: Orders

## 2014-02-10 ENCOUNTER — Other Ambulatory Visit: Payer: Self-pay

## 2014-02-11 ENCOUNTER — Telehealth: Payer: Self-pay | Admitting: *Deleted

## 2014-02-11 NOTE — Telephone Encounter (Signed)
Prior authorization sent for review on 02/11/14, for Effient 10 mg po daily.  Prior authorization requested by pts pharmacy [facility] fax# [phone].  Sent this info for review on 10/6, and 24-48 hour for determination, reference # [id number].

## 2014-02-12 ENCOUNTER — Ambulatory Visit (HOSPITAL_COMMUNITY)
Admission: RE | Admit: 2014-02-12 | Discharge: 2014-02-13 | Disposition: A | Discharge status: Home / Self Care | Payer: Medicare Other | Source: Ambulatory Visit | Attending: Interventional Cardiology | Admitting: Interventional Cardiology

## 2014-02-12 ENCOUNTER — Encounter (HOSPITAL_COMMUNITY): Payer: Self-pay | Admitting: General Practice

## 2014-02-12 ENCOUNTER — Encounter (HOSPITAL_COMMUNITY)
Admission: RE | Disposition: A | Discharge status: Home / Self Care | Payer: Medicare Other | Source: Ambulatory Visit | Attending: Interventional Cardiology

## 2014-02-12 DIAGNOSIS — Z79899 Other long term (current) drug therapy: Secondary | ICD-10-CM | POA: Diagnosis not present

## 2014-02-12 DIAGNOSIS — E119 Type 2 diabetes mellitus without complications: Secondary | ICD-10-CM | POA: Diagnosis not present

## 2014-02-12 DIAGNOSIS — E785 Hyperlipidemia, unspecified: Secondary | ICD-10-CM | POA: Diagnosis not present

## 2014-02-12 DIAGNOSIS — I10 Essential (primary) hypertension: Secondary | ICD-10-CM | POA: Diagnosis not present

## 2014-02-12 DIAGNOSIS — Z794 Long term (current) use of insulin: Secondary | ICD-10-CM | POA: Insufficient documentation

## 2014-02-12 DIAGNOSIS — Z7902 Long term (current) use of antithrombotics/antiplatelets: Secondary | ICD-10-CM | POA: Insufficient documentation

## 2014-02-12 DIAGNOSIS — I25118 Atherosclerotic heart disease of native coronary artery with other forms of angina pectoris: Secondary | ICD-10-CM | POA: Insufficient documentation

## 2014-02-12 DIAGNOSIS — Z955 Presence of coronary angioplasty implant and graft: Secondary | ICD-10-CM | POA: Diagnosis not present

## 2014-02-12 DIAGNOSIS — E669 Obesity, unspecified: Secondary | ICD-10-CM | POA: Diagnosis not present

## 2014-02-12 DIAGNOSIS — Y831 Surgical operation with implant of artificial internal device as the cause of abnormal reaction of the patient, or of later complication, without mention of misadventure at the time of the procedure: Secondary | ICD-10-CM | POA: Diagnosis not present

## 2014-02-12 DIAGNOSIS — R9439 Abnormal result of other cardiovascular function study: Secondary | ICD-10-CM

## 2014-02-12 DIAGNOSIS — T82858A Stenosis of vascular prosthetic devices, implants and grafts, initial encounter: Principal | ICD-10-CM | POA: Insufficient documentation

## 2014-02-12 DIAGNOSIS — F17210 Nicotine dependence, cigarettes, uncomplicated: Secondary | ICD-10-CM | POA: Diagnosis not present

## 2014-02-12 DIAGNOSIS — I200 Unstable angina: Secondary | ICD-10-CM | POA: Diagnosis present

## 2014-02-12 DIAGNOSIS — IMO0001 Reserved for inherently not codable concepts without codable children: Secondary | ICD-10-CM

## 2014-02-12 DIAGNOSIS — F329 Major depressive disorder, single episode, unspecified: Secondary | ICD-10-CM | POA: Insufficient documentation

## 2014-02-12 DIAGNOSIS — Z6838 Body mass index (BMI) 38.0-38.9, adult: Secondary | ICD-10-CM | POA: Diagnosis not present

## 2014-02-12 DIAGNOSIS — I2510 Atherosclerotic heart disease of native coronary artery without angina pectoris: Secondary | ICD-10-CM | POA: Diagnosis present

## 2014-02-12 DIAGNOSIS — Z8679 Personal history of other diseases of the circulatory system: Secondary | ICD-10-CM

## 2014-02-12 DIAGNOSIS — I209 Angina pectoris, unspecified: Secondary | ICD-10-CM | POA: Diagnosis present

## 2014-02-12 HISTORY — DX: Acute myocardial infarction, unspecified: I21.9

## 2014-02-12 HISTORY — DX: Atherosclerotic heart disease of native coronary artery without angina pectoris: I25.10

## 2014-02-12 HISTORY — PX: LEFT HEART CATHETERIZATION WITH CORONARY ANGIOGRAM: SHX5451

## 2014-02-12 HISTORY — DX: Unspecified osteoarthritis, unspecified site: M19.90

## 2014-02-12 HISTORY — DX: Pneumonia, unspecified organism: J18.9

## 2014-02-12 HISTORY — DX: Chronic obstructive pulmonary disease, unspecified: J44.9

## 2014-02-12 LAB — POCT ACTIVATED CLOTTING TIME
Activated Clotting Time: 298 seconds
Activated Clotting Time: 0 seconds
Activated Clotting Time: 275 seconds
Activated Clotting Time: 253 seconds

## 2014-02-12 LAB — GLUCOSE, CAPILLARY
GLUCOSE-CAPILLARY: 201 mg/dL — AB (ref 70–99)
Glucose-Capillary: 137 mg/dL — ABNORMAL HIGH (ref 70–99)
Glucose-Capillary: 129 mg/dL — ABNORMAL HIGH (ref 70–99)
Glucose-Capillary: 137 mg/dL — ABNORMAL HIGH (ref 70–99)

## 2014-02-12 SURGERY — LEFT HEART CATHETERIZATION WITH CORONARY ANGIOGRAM
Anesthesia: LOCAL

## 2014-02-12 MED ORDER — NITROGLYCERIN 0.4 MG SL SUBL: 0.4000 mg | SUBLINGUAL_TABLET | SUBLINGUAL | Status: DC | PRN

## 2014-02-12 MED ORDER — HYDROCHLOROTHIAZIDE 25 MG PO TABS
25.0000 mg | ORAL_TABLET | Freq: Every day | ORAL | Status: DC
  Administered 2014-02-12 – 2014-02-13 (×2): 25 mg via ORAL
  Filled 2014-02-12 (×2): qty 1

## 2014-02-12 MED ORDER — MIDAZOLAM HCL 2 MG/2ML IJ SOLN
INTRAMUSCULAR | Status: AC
  Filled 2014-02-12: qty 2

## 2014-02-12 MED ORDER — NITROGLYCERIN 1 MG/10 ML FOR IR/CATH LAB
INTRA_ARTERIAL | Status: AC
  Filled 2014-02-12: qty 10

## 2014-02-12 MED ORDER — FENTANYL CITRATE 0.05 MG/ML IJ SOLN
INTRAMUSCULAR | Status: AC
  Filled 2014-02-12: qty 2

## 2014-02-12 MED ORDER — CLONAZEPAM 0.5 MG PO TABS
0.2500 mg | ORAL_TABLET | Freq: Two times a day (BID) | ORAL | Status: DC | PRN
  Administered 2014-02-12: 23:00:00 0.5 mg via ORAL
  Administered 2014-02-13: 06:00:00 0.25 mg via ORAL
  Filled 2014-02-12 (×3): qty 1

## 2014-02-12 MED ORDER — LIDOCAINE HCL (PF) 1 % IJ SOLN
INTRAMUSCULAR | Status: AC
  Filled 2014-02-12: qty 30

## 2014-02-12 MED ORDER — ATORVASTATIN CALCIUM 80 MG PO TABS
80.0000 mg | ORAL_TABLET | Freq: Every day | ORAL | Status: DC
  Administered 2014-02-12: 80 mg via ORAL
  Filled 2014-02-12 (×2): qty 1

## 2014-02-12 MED ORDER — ASPIRIN 81 MG PO CHEW
81.0000 mg | CHEWABLE_TABLET | Freq: Every day | ORAL | Status: DC
  Administered 2014-02-13: 10:00:00 81 mg via ORAL
  Filled 2014-02-12: qty 1

## 2014-02-12 MED ORDER — SODIUM CHLORIDE 0.9 % IV SOLN
250.0000 mL | INTRAVENOUS | Status: DC | PRN
  Administered 2014-02-12: 1000 mL via INTRAVENOUS

## 2014-02-12 MED ORDER — SERTRALINE HCL 50 MG PO TABS
150.0000 mg | ORAL_TABLET | Freq: Every day | ORAL | Status: DC
  Administered 2014-02-12: 15:00:00 150 mg via ORAL
  Filled 2014-02-12 (×2): qty 1

## 2014-02-12 MED ORDER — METOPROLOL SUCCINATE ER 25 MG PO TB24
25.0000 mg | ORAL_TABLET | Freq: Two times a day (BID) | ORAL | Status: DC
  Filled 2014-02-12 (×3): qty 1

## 2014-02-12 MED ORDER — LISINOPRIL 40 MG PO TABS
40.0000 mg | ORAL_TABLET | Freq: Every day | ORAL | Status: DC
  Filled 2014-02-12: qty 1

## 2014-02-12 MED ORDER — SODIUM CHLORIDE 0.9 % IJ SOLN: 3.0000 mL | Freq: Two times a day (BID) | INTRAMUSCULAR | Status: DC

## 2014-02-12 MED ORDER — ACETAMINOPHEN 325 MG PO TABS
650.0000 mg | ORAL_TABLET | ORAL | Status: DC | PRN
  Administered 2014-02-12: 325 mg via ORAL
  Filled 2014-02-12: qty 2

## 2014-02-12 MED ORDER — SODIUM CHLORIDE 0.9 % IV SOLN
INTRAVENOUS | Status: AC
  Administered 2014-02-12: 13:00:00 via INTRAVENOUS

## 2014-02-12 MED ORDER — VERAPAMIL HCL 2.5 MG/ML IV SOLN
INTRAVENOUS | Status: AC
  Filled 2014-02-12: qty 2

## 2014-02-12 MED ORDER — NITROGLYCERIN IN D5W 200-5 MCG/ML-% IV SOLN
INTRAVENOUS | Status: AC
  Filled 2014-02-12: qty 250

## 2014-02-12 MED ORDER — NITROGLYCERIN IN D5W 200-5 MCG/ML-% IV SOLN
20.0000 ug/min | INTRAVENOUS | Status: AC
  Administered 2014-02-12: 20 ug/min via INTRAVENOUS

## 2014-02-12 MED ORDER — SODIUM CHLORIDE 0.9 % IJ SOLN: 3.0000 mL | INTRAMUSCULAR | Status: DC | PRN

## 2014-02-12 MED ORDER — INSULIN ASPART 100 UNIT/ML ~~LOC~~ SOLN: 0.0000 [IU] | Freq: Three times a day (TID) | SUBCUTANEOUS | Status: DC

## 2014-02-12 MED ORDER — NICOTINE 7 MG/24HR TD PT24
7.0000 mg | MEDICATED_PATCH | Freq: Every day | TRANSDERMAL | Status: DC
  Administered 2014-02-12 – 2014-02-13 (×2): 7 mg via TRANSDERMAL
  Filled 2014-02-12 (×2): qty 1

## 2014-02-12 MED ORDER — HYDROCODONE-ACETAMINOPHEN 10-325 MG PO TABS
1.0000 | ORAL_TABLET | Freq: Four times a day (QID) | ORAL | Status: DC | PRN
  Administered 2014-02-12: 1 via ORAL
  Filled 2014-02-12: qty 1

## 2014-02-12 MED ORDER — PRASUGREL HCL 10 MG PO TABS
ORAL_TABLET | ORAL | Status: AC
  Filled 2014-02-12: qty 1

## 2014-02-12 MED ORDER — PRASUGREL HCL 10 MG PO TABS
10.0000 mg | ORAL_TABLET | Freq: Every day | ORAL | Status: DC
  Filled 2014-02-12: qty 1

## 2014-02-12 MED ORDER — HEPARIN (PORCINE) IN NACL 2-0.9 UNIT/ML-% IJ SOLN
INTRAMUSCULAR | Status: AC
  Filled 2014-02-12: qty 1000

## 2014-02-12 MED ORDER — GLIPIZIDE 10 MG PO TABS
10.0000 mg | ORAL_TABLET | Freq: Two times a day (BID) | ORAL | Status: DC
  Administered 2014-02-12 – 2014-02-13 (×2): 10 mg via ORAL
  Filled 2014-02-12 (×4): qty 1

## 2014-02-12 MED ORDER — ASPIRIN 81 MG PO CHEW: 81.0000 mg | CHEWABLE_TABLET | ORAL | Status: DC

## 2014-02-12 MED ORDER — ONDANSETRON HCL 4 MG/2ML IJ SOLN: 4.0000 mg | Freq: Four times a day (QID) | INTRAMUSCULAR | Status: DC | PRN

## 2014-02-12 MED ORDER — ALBUTEROL SULFATE HFA 108 (90 BASE) MCG/ACT IN AERS: 2.0000 | INHALATION_SPRAY | Freq: Four times a day (QID) | RESPIRATORY_TRACT | Status: DC | PRN

## 2014-02-12 MED ORDER — METFORMIN HCL 500 MG PO TABS
1000.0000 mg | ORAL_TABLET | Freq: Two times a day (BID) | ORAL | Status: DC
  Filled 2014-02-12 (×2): qty 2

## 2014-02-12 MED ORDER — ALBUTEROL SULFATE (2.5 MG/3ML) 0.083% IN NEBU: 2.5000 mg | INHALATION_SOLUTION | Freq: Four times a day (QID) | RESPIRATORY_TRACT | Status: DC | PRN

## 2014-02-12 MED ORDER — PANTOPRAZOLE SODIUM 40 MG PO TBEC
40.0000 mg | DELAYED_RELEASE_TABLET | Freq: Every day | ORAL | Status: DC
  Administered 2014-02-12: 40 mg via ORAL
  Filled 2014-02-12 (×2): qty 1

## 2014-02-12 MED ORDER — HEPARIN SODIUM (PORCINE) 1000 UNIT/ML IJ SOLN
INTRAMUSCULAR | Status: AC
  Filled 2014-02-12: qty 1

## 2014-02-12 NOTE — CV Procedure (Signed)
Left Heart Catheterization with Coronary Angiography and PCI/DES Report  Dwayne MaterDennis [name]  68 y.o.  male 08/27/1945  Procedure Date: 02/12/2014 Referring Physician: Lorne SkeensKaterina Nelson, M.D. Primary Cardiologist: Lorne SkeensKaterina Nelson, M.D.  INDICATIONS: Class III angina with high-risk myocardial perfusion study. Prior history of multivessel stenting.  PROCEDURE: 1. Left heart catheterization; 2. Coronary angiography; 3. Left ventriculography; 4. DES RCA for ISR  CONSENT:  The risks, benefits, and details of the procedure were explained in detail to the patient. Risks including death, stroke, heart attack, kidney injury, allergy, limb ischemia, bleeding and radiation injury were discussed.  The patient verbalized understanding and wanted to proceed.  Informed written consent was obtained.  PROCEDURE TECHNIQUE:  After Xylocaine anesthesia a 5 French Slender sheath was placed in the right radial artery with an angiocath and the modified Seldinger technique.  Coronary angiography was done using a 5 F 5 [place]French JR 4 and JL 3.5 cm diagnostic catheter.  Left ventriculography was done using the JR 4 catheter and hand injection.   Digital review demonstrated an intermediate mid circumflex and proximal LAD disease. The stent in the first diagonal contains mild to moderate in-stent restenosis less than 60%. The proximal/mid right coronary contains a severe in-stent restenosis (95%). It is felt that this stenosis would, for the major perfusion abnormality noted on nuclear testing. The patient continues to smoke and is at high risk for progression of disease in other territories. The LAD and circumflex did not appear severe enough to warrant PCI or CABG at this time.  We therefore embarked upon PCI strategy for restenosis in the right coronary. This is a prior infarct artery. 2 stents reside in the proximal to mid right coronary and there is severe 95% ISR. We gave weight-based heparin. A CT was documented to be  greater than 300. We gave 20 mg of that Effient. He has been on daily therapy. We then proceeded with PCI and had difficulty finding a guide catheter that would adequately set in the ostium of the right coronary and at the same time maintain coaxial alignment. We used 6 [place] JR 4, XB RCA, 1 cm 6 [place]French Icari right, no torque right, and ultimately a 6 [place] AL 0.75 cm guide. Of all the guide cath was used as Catholic gave the best support. We eventually able to advance the guidewire into the distal vessel. I placed a buddy wire for stabilization. Even with the buddy wire there was a tendency for the guide catheter to prolapse out of the coronary ostium. We predilated with a 3.0 x 12 mm balloon. We will unable to advance a 3.0 x 10 cutting balloon. We then deployed a 3.0 x 18 Alpine DES. The stent was deployed to 16 atmospheres. The buddy wire was removed before the stent was deployed. We then reinserted the buddy wire (0.014 BMW) 4 guide stabilization. We then advanced a 12 x 3.5 mm [place] balloon and performed a high pressure post dilatation to 15 atmospheres. Post dilatation led to a nice angiographic result and the case was terminated. TIMI grade 3 flow was noted.   A wrist band was used for hemostasis using 13 cc of air.  CONTRAST:  Total of 250 cc.  COMPLICATIONS:  Prolonged procedure from the right radial due to inability of guide catheters to maintain ostial engagement and coaxial alignment resulting in multiple instances of guide catheter prolapse out of the artery requiring re-wiring of the vessel. Luckily we did not get into an acute ischemic situation  and the patient tolerated the procedure relatively well. Resource rosacea and was increased because of difficulty with guide positioning.  HEMODYNAMICS:  Aortic pressure 138/62 mmHg; LV pressure 143/4 mmHg; LVEDP 21 mm mercury  ANGIOGRAPHIC DATA:   The left main coronary artery is widely patent.  The left anterior descending artery is proximal  eccentric 50-75% stenosis in the region of the first diagonal arises from the LAD. The diagonal has a stent that is patent and proximal to the stent margin there is a 70% narrowing. The second diagonal is large and free of any significant disease.  The left circumflex artery is patent. Gives origin to a large vessel that supplies 3 obtuse marginal branches. The proximal to mid vessel contains eccentric 50-70% narrowing. The dominant obtuse marginal is the terminal marginal.  Ramus intermedius is widely patent but contains luminal irregularity.  The right coronary artery is contains proximal to mid stents. Within the midportion of the previously stented region there is segmental 95% stenosis. The distal vessel is tortuous.   PCI RESULTS: The proximal to mid severe RCA ISR is reduced from 95% to 0% after placing a 3.0 x 18 drug eluting stent and post dilated to 3.5 at 15 atmospheres. As noted above the procedure was very difficult from the right radial approach do to inability to have reliable guide catheter support. Coaxial alignment was difficult. Right coronary intervention in the future should be considered from the femoral or left radial approach.  LEFT VENTRICULOGRAM:  Left ventricular angiogram was done in the 30 RAO projection and revealed normal LV size and function. EF 55%.   IMPRESSIONS:  1. 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 2. Moderate proximal to mid LAD and circumflex disease with lesions in the 50-75% stenosis range. The stent in the first diagonal is patent. There is 70% stenosis proximal to the stent margin. 3. Overall normal left ventricular function 4. Complicated but successful DES implantation the region of in-stent restenosis in the right coronary reducing a greater than 95% stenosis to 0% with TIMI grade 3 flow.   RECOMMENDATION:  Aspirin and Effient Discharge in a.m. If continued angina or recurrent right coronary restenosis, may need to  consider coronary bypass surgery. If the right coronary stent is patent, LAD and circumflex could be treated with PCI when they become symptomatic.  I have encouraged the patient to discontinue smoking.  10 mics of IV nitroglycerin for 12 hours then discontinue to.

## 2014-02-12 NOTE — Progress Notes (Signed)
Plus incont.

## 2014-02-12 NOTE — Interval H&P Note (Signed)
Cath Lab Visit (complete for each Cath Lab visit)  Clinical Evaluation Leading to the Procedure:   ACS: No.  Non-ACS:    Anginal Classification: CCS III  Anti-ischemic medical therapy: Maximal Therapy (2 or more classes of medications)  Non-Invasive Test Results: Intermediate-risk stress test findings: cardiac mortality 1-3%/year  Prior CABG: No previous CABG       History and Physical Interval Note:  02/12/2014 9:38 AM  Dwayne [name]  has presented today for surgery, with the diagnosis of Chest Pain  The various methods of treatment have been discussed with the patient and family. After consideration of risks, benefits and other options for treatment, the patient has consented to  Procedure(s): LEFT HEART CATHETERIZATION WITH CORONARY ANGIOGRAM (N/A) as a surgical intervention .  The patient's history has been reviewed, patient examined, no change in status, stable for surgery.  I have reviewed the patient's chart and labs.  Questions were answered to the patient's satisfaction.     Lesleigh NoeSMITH III,HENRY W

## 2014-02-12 NOTE — H&P (View-Only) (Signed)
Patient ID: Davine Schoenbeck, male   DOB: 04/26/1946, 68 y.o.   MRN: [id number]     Patient Name: Sierra Dinino Date of Encounter: 01/20/2014  Primary Care Provider:  No PCP Per Patient Primary Cardiologist:  Khamya Topp H  Patient Profile  Establishing cardiology care after move from [place]  Problem List   Past Medical History  Diagnosis Date  . Palpitations   . Hypertension, benign     Benign  . Hyperlipidemia   . Esophageal reflux   . IDDM (insulin dependent diabetes mellitus)   . Depression   . Past heart attack   . Anxiety   . Compression fracture of L2    Past Surgical History  Procedure Laterality Date  . Angioplasty    . Carotid stent insertion      Allergies  Allergies  Allergen Reactions  . Morphine And Related Swelling    Swelling of the tongue and face    HPI  67 year old male with h/o IDDM, obesity, hypertension, hyperlipidemia, ongoing smoking and CAD who just moved to [place] and is here to establish cardiology care. 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 He was doing well until 2012 when he had another episode chest pain and received stent to RCA another the next day for in stent restenosis. He was sterted on Prasugrel after that. He describes his pain as reflux but stronger. He states that since the last stent he has been asymptomatic. He is trying to exercise daily and has CP or SOB.    1 year followup - 12/17/2013 - the patient had a back injury in November 2014. He is otherwise doing well no chest pain, no shortness of breath, he walks mainly because of his back injury. He denies any lower extremity edema orthopnea medications. His only complaint is fatigue and sometimes low blood pressure.  This is post hospital visit, the patient went to the ER on 01/10/14 with atypical resting epigastric pain - 2 episodes. It terminated in the ER, possibly due to NTG given. No  more episodes since then. He is planning to schedule an EGD. Stable DOE, no syncope.  Home Medications  Prior to Admission medications   Medication Sig Start Date End Date Taking? Authorizing Provider  albuterol (PROVENTIL) (2.5 MG/3ML) 0.083% nebulizer solution Take 2.5 mg by nebulization every 6 (six) hours as needed for wheezing.   Yes Historical Provider, MD  atorvastatin (LIPITOR) 80 MG tablet Take 80 mg by mouth daily.   Yes Historical Provider, MD  clonazePAM (KLONOPIN) 0.5 MG tablet Take 0.5 mg by mouth 2 (two) times daily as needed for anxiety.   Yes Historical Provider, MD  GLIPIZIDE ER PO Take 10 mg by mouth 2 (two) times daily.   Yes Historical Provider, MD  hydrochlorothiazide (HYDRODIURIL) 25 MG tablet Take 25 mg by mouth daily.   Yes Historical Provider, MD  Insulin Glargine (LANTUS Griffin) Inject 14 Units into the skin daily.   Yes Historical Provider, MD  lisinopril (PRINIVIL,ZESTRIL) 40 MG tablet Take 40 mg by mouth daily.   Yes Historical Provider, MD  metFORMIN (GLUCOPHAGE) 1000 MG tablet Take 1,000 mg by mouth 2 (two) times daily with a meal.   Yes Historical Provider, MD  metoprolol (LOPRESSOR) 50 MG tablet Take 50 mg by mouth 2 (two) times daily.   Yes Historical Provider, MD  nitroGLYCERIN (NITROSTAT) 0.4 MG SL tablet Place 0.4   mg under the tongue every 5 (five) minutes as needed for chest pain.   Yes Historical Provider, MD  prasugrel (EFFIENT) 10 MG TABS tablet Take 10 mg by mouth daily.   Yes Historical Provider, MD  sertraline (ZOLOFT) 100 MG tablet Take 100 mg by mouth daily.   Yes Historical Provider, MD    Family History  No family history on file.  Social History  History   Social History  . Marital Status: Married    Spouse Name: N/A    Number of Children: N/A  . Years of Education: N/A   Occupational History  . Not on file.   Social History Main Topics  . Smoking status: Current Every Day Smoker -- 1.00 packs/day for 45 years    Types: Cigarettes    . Smokeless tobacco: Never Used  . Alcohol Use: No  . Drug Use: No  . Sexual Activity: Not on file   Other Topics Concern  . Not on file   Social History Narrative  . No narrative on file     Review of Systems General:  No chills, fever, night sweats or weight changes.  Cardiovascular:  No chest pain, dyspnea on exertion, edema, orthopnea, palpitations, paroxysmal nocturnal dyspnea. Dermatological: No rash, lesions/masses Respiratory: No cough, dyspnea Urologic: No hematuria, dysuria Abdominal:   No nausea, vomiting, diarrhea, bright red blood per rectum, melena, or hematemesis Neurologic:  No visual changes, wkns, changes in mental status. All other systems reviewed and are otherwise negative except as noted above.  Physical Exam  Blood pressure 136/58, pulse 66, height 5\' 10"  (1.778 m), weight 267 lb 12.8 oz (121.473 kg), SpO2 97.00%.  General: Pleasant, NAD, obese Psych: Normal affect. Neuro: Alert and oriented X 3. Moves all extremities spontaneously. HEENT: Normal  Neck: Supple without bruits or JVD. Lungs:  Barrel chest, Resp regular and unlabored, CTA.  Heart: RRR no s3, s4, or murmurs. Abdomen: Soft, non-tender, non-distended, BS + x 4. No bruit above AA  Extremities: No clubbing, cyanosis or edema. DP/PT/Radials 2+ and equal bilaterally.  Accessory Clinical Findings  ECG - SR, 1. AVB, old inferior MI  Assessment & Plan  68 year old male  1. CAD, S/P multiple stening including RCA, diagonal, currently on dual antiplatelet therapy with ASA and prasugrel, we switched to Plavix as it is cheaper. Continue BB, ACE, statin. Now new epigastric/chest pain, ECG unchanged, however resolved with NTG. We will schedule a Lexiscan nuclear stress test.  2. IDDM -  Well controlled, HbA1c 6.3 %  3. Hypertension - well controlled, sometimes low, and fatigue, decrease  toprol XL to 25 mg PO daily  4. Hyperlipidemia - well controlled on atorvastatin 80 mh QHS, last values in  08/2012 LDL 45, HDL 45, TAG 119, recheck in 6 months   Follow up in 6 months if normal stress test.  Lars MassonNELSON, Christain Mcraney H, MD 01/20/2014, 10:30 AM

## 2014-02-12 NOTE — Care Management Note (Addendum)
  Page 1 of 1   02/12/2014     1:40:51 PM CARE MANAGEMENT NOTE 02/12/2014  Patient:  Dwayne [name],Dwayne [name]   Account Number:  [id number]  Date Initiated:  02/12/2014  Documentation initiated by:  Jonda Alanis  Subjective/Objective Assessment:   PROCEDURE: 1. Left heart catheterization; 2. Coronary angiography; 3. Left ventriculography; 4. DES RCA for ISR     Action/Plan:   CM to follow for dispostion needs   Anticipated DC Date:  02/13/2014   Anticipated DC Plan:  HOME/SELF CARE         Choice offered to / List presented to:             Status of service:  Completed, signed off Medicare Important Message given?   (If response is "NO", the following Medicare IM given date fields will be blank) Date Medicare IM given:   Medicare IM given by:   Date Additional Medicare IM given:   Additional Medicare IM given by:    Discharge Disposition:  HOME/SELF CARE  Per UR Regulation:    If discussed at Long Length of Stay Meetings, dates discussed:    Comments:  Olita Takeshita RN, BSN, MSHL, CCM  Nurse - Case Manager,  (Unit [phone])  [phone]  02/12/2014 Med Review:  prasugrel (EFFIENT) tablet 10 mg  qd  (patient active on Effient prior to this admission)

## 2014-02-13 ENCOUNTER — Telehealth: Payer: Self-pay | Admitting: Cardiology

## 2014-02-13 DIAGNOSIS — E119 Type 2 diabetes mellitus without complications: Secondary | ICD-10-CM | POA: Diagnosis not present

## 2014-02-13 DIAGNOSIS — I10 Essential (primary) hypertension: Secondary | ICD-10-CM | POA: Diagnosis not present

## 2014-02-13 DIAGNOSIS — I200 Unstable angina: Secondary | ICD-10-CM

## 2014-02-13 DIAGNOSIS — Z955 Presence of coronary angioplasty implant and graft: Secondary | ICD-10-CM

## 2014-02-13 DIAGNOSIS — R931 Abnormal findings on diagnostic imaging of heart and coronary circulation: Secondary | ICD-10-CM

## 2014-02-13 DIAGNOSIS — Z794 Long term (current) use of insulin: Secondary | ICD-10-CM | POA: Diagnosis not present

## 2014-02-13 DIAGNOSIS — Z8679 Personal history of other diseases of the circulatory system: Secondary | ICD-10-CM

## 2014-02-13 DIAGNOSIS — E669 Obesity, unspecified: Secondary | ICD-10-CM | POA: Diagnosis not present

## 2014-02-13 DIAGNOSIS — I25118 Atherosclerotic heart disease of native coronary artery with other forms of angina pectoris: Secondary | ICD-10-CM | POA: Diagnosis not present

## 2014-02-13 DIAGNOSIS — T82858A Stenosis of vascular prosthetic devices, implants and grafts, initial encounter: Secondary | ICD-10-CM | POA: Diagnosis not present

## 2014-02-13 LAB — BASIC METABOLIC PANEL
Anion gap: 17 — ABNORMAL HIGH (ref 5–15)
BUN: 17 mg/dL (ref 6–23)
CALCIUM: 9 mg/dL (ref 8.4–10.5)
CHLORIDE: 98 meq/L (ref 96–112)
CO2: 25 meq/L (ref 19–32)
CREATININE: 0.85 mg/dL (ref 0.50–1.35)
GFR calc Af Amer: >90 mL/min (ref >90)
GFR calc non Af Amer: 88 mL/min — ABNORMAL LOW (ref >90)
GLUCOSE: 111 mg/dL — AB (ref 70–99)
Potassium: 3.8 mEq/L (ref 3.7–5.3)
Sodium: 140 mEq/L (ref 137–147)

## 2014-02-13 LAB — CBC
HEMATOCRIT: 38 % — AB (ref 39.0–52.0)
HEMOGLOBIN: 13.4 g/dL (ref 13.0–17.0)
MCH: 33.3 pg (ref 26.0–34.0)
MCHC: 35.3 g/dL (ref 30.0–36.0)
MCV: 94.5 fL (ref 78.0–100.0)
Platelets: 237 10*3/uL (ref 150–400)
RBC: 4.02 MIL/uL — ABNORMAL LOW (ref 4.22–5.81)
RDW: 13.3 % (ref 11.5–15.5)
WBC: 11.2 10*3/uL — ABNORMAL HIGH (ref 4.0–10.5)

## 2014-02-13 LAB — GLUCOSE, CAPILLARY: Glucose-Capillary: 131 mg/dL — ABNORMAL HIGH (ref 70–99)

## 2014-02-13 MED ORDER — PRASUGREL HCL 10 MG PO TABS: 10.0000 mg | ORAL_TABLET | Freq: Every day | ORAL | Status: DC

## 2014-02-13 MED ORDER — ASPIRIN 81 MG PO CHEW: 81.0000 mg | CHEWABLE_TABLET | Freq: Every day | ORAL | Status: DC

## 2014-02-13 MED ORDER — METOPROLOL TARTRATE 25 MG PO TABS
25.0000 mg | ORAL_TABLET | Freq: Two times a day (BID) | ORAL | Status: DC
  Administered 2014-02-13: 25 mg via ORAL
  Filled 2014-02-13 (×2): qty 1

## 2014-02-13 MED ORDER — METOPROLOL TARTRATE 50 MG PO TABS
50.0000 mg | ORAL_TABLET | Freq: Two times a day (BID) | ORAL | Status: DC
  Filled 2014-02-13 (×2): qty 1

## 2014-02-13 MED ORDER — METOPROLOL TARTRATE 25 MG PO TABS: 25.0000 mg | ORAL_TABLET | Freq: Two times a day (BID) | ORAL | Status: DC

## 2014-02-13 MED ORDER — METFORMIN HCL 1000 MG PO TABS: 1000.0000 mg | ORAL_TABLET | Freq: Two times a day (BID) | ORAL | Status: AC

## 2014-02-13 NOTE — Progress Notes (Signed)
CARDIAC REHAB PHASE I   PRE:  Rate/Rhythm: 67 SR  BP:  Supine:   Sitting: 152/72  Standing:    SaO2:   MODE:  Ambulation: 450 ft   POST:  Rate/Rhythm: 72 SR  BP:  Supine:   Sitting: 136/76  Standing:    SaO2:  0755-0907 Pt walked 450 ft with his cane with steady gait. C/o pain in lower back from fracture at end of walk. No CP. Education completed with pt who voiced understanding. Reviewed NTG use, effient for stent and carb counting. Discussed smoking cessation and gave handouts. Pt stated he was going to try the nicotine patches. Encouraged pt to walk as tolerated. Walking is limited by recent back fracture. Has cane and walker at home. Discussed CRP 2 and pt [facility] Stated he had done program before.   Luetta Nuttingharlene Buffie Herne, RN BSN  02/13/2014 9:03 AM

## 2014-02-13 NOTE — Discharge Summary (Signed)
3Physician Discharge Summary  Patient ID: Dwayne [name] MRN: [id number] DOB/AGE: Dec 20, 1945 68 y.o.  Admit date: 02/12/2014 Discharge date: 02/13/2014  Admission Diagnoses: Angina pectoris, crescendo  Discharge Diagnoses:  Principal Problem:   Angina pectoris, crescendo Active Problems:   CAD (coronary artery disease) of artery bypass graft   Essential hypertension   IDDM (insulin dependent diabetes mellitus)   Abnormal nuclear stress test   History of class III angina pectoris   Presence of drug coated stent in left circumflex coronary artery   Discharged Condition: stable  Hospital Course: The patient is a 68 year old male with a history of insulin-dependent diabetes mellitus, obesity, hypertension, hyperlipidemia, ongoing tobacco use and CAD. He was diagnosed with CAD in 1998, when he had an inferior MI and received a stent to his RCA. He had a stent placed to his diagonal vessel in 1999. In 2012 he require repeat intervention to his RCA for in-stent restenosis.   He recently moved from [place] to [place] and established cardiovascular care with Dr. Delton See. He was recently seen by her in clinic and complained of substernal/epigastric pain. Subsequently, he underwent evaluation with nuclear stress testing. This was Lexiscan NST that was performed 02/04/2014 and was interpreted as a high risk study. Subsequently, he was referred for cardiac catheterization.   He was admitted to [facility] 02/12/2014. The procedure was performed by Dr. Katrinka Blazing. Access was obtained via the right radial artery. Complete angiographic details listed below. Most notably, he was found to have proximal to mid severe RCA in-stent restenosis. This was successfully treated with PCI utilizing a drug-eluting stent. The stenosis was reduced from 95% to 0%. There was also moderate proximal to mid LAD and circumflex disease with lesions in the 50-75% stenosis range. The stent in the first diagonal was seen  to be patent. There was 70% stenosis proximal to the stent margin. Left ventriculography revealed normal systolic function with an estimated ejection fraction of 55%. He tolerated the procedure well. He left the Cath Lab in stable condition and was continued on dual antiplatelet therapy with aspirin plus Effient. He was continued on beta blocker, ACE inhibitor and statin therapy. He denied any recurrent chest pain. His right radial access site remained stable. He had no difficulty ambulating with cardiac rehabilitation. Vital signs remained stable. He was last seen and examined by Dr. Herbie Baltimore who determined that he was stable for discharge home. Post hospital followup has been arranged with his primary cardiologist, Dr. Delton See, on 02/26/2014. He was instructed to hold metformin for 48 hours.   Consults: None  Significant Diagnostic Studies:   Left heart catheterization 02/12/2014  HEMODYNAMICS: Aortic pressure 138/62 mmHg; LV pressure 143/4 mmHg; LVEDP 21 mm mercury  ANGIOGRAPHIC DATA: The left main coronary artery is widely patent.  The left anterior descending artery is proximal eccentric 50-75% stenosis in the region of the first diagonal arises from the LAD. The diagonal has a stent that is patent and proximal to the stent margin there is a 70% narrowing. The second diagonal is large and free of any significant disease.  The left circumflex artery is patent. Gives origin to a large vessel that supplies 3 obtuse marginal branches. The proximal to mid vessel contains eccentric 50-70% narrowing. The dominant obtuse marginal is the terminal marginal.  Ramus intermedius is widely patent but contains luminal irregularity.  The right coronary artery is contains proximal to mid stents. Within the midportion of the previously stented region there is segmental 95% stenosis. The distal vessel is  tortuous.  PCI RESULTS: The proximal to mid severe RCA ISR is reduced from 95% to 0% after placing a 3.0 x 18 drug  eluting stent and post dilated to 3.5 at 15 atmospheres. As noted above the procedure was very difficult from the right radial approach do to inability to have reliable guide catheter support. Coaxial alignment was difficult. Right coronary intervention in the future should be considered from the femoral or left radial approach.  LEFT VENTRICULOGRAM: Left ventricular angiogram was done in the 30 RAO projection and revealed normal LV size and function. EF 55%.  IMPRESSIONS: 1. 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  2. Moderate proximal to mid LAD and circumflex disease with lesions in the 50-75% stenosis range. The stent in the first diagonal is patent. There is 70% stenosis proximal to the stent margin.  3. Overall normal left ventricular function  4. Complicated but successful DES implantation the region of in-stent restenosis in the right coronary reducing a greater than 95% stenosis to 0% with TIMI grade 3 flow.  RECOMMENDATION: Aspirin and Effient  Discharge in a.m.  If continued angina or recurrent right coronary restenosis, may need to consider coronary bypass surgery. If the right coronary stent is patent, LAD and circumflex could be treated with PCI when they become symptomatic.   Treatments: See Hospital Course  Discharge Exam: Blood pressure 152/72, pulse 67, temperature 98 F (36.7 C), temperature source Oral, resp. rate 20, height 5\' 10"  (1.778 m), weight 269 lb 10 oz (122.3 kg), SpO2 95.00%. General appearance: alert, cooperative, no distress and moderately obese Neck: no carotid bruit and no JVD Cardio: regular rate and rhythm, S1, S2 normal, no murmur, click, rub or gallop Extremities: no LEE Pulses: 2+ and symmetric Skin: warm and dry  Neurologic: Grossly normal Incision/Wound: Cath access site stable free from hematoma. 2+ radial pulse  Disposition: 01-Home or Self Care      Discharge Instructions   Amb Referral to Cardiac Rehabilitation     Complete by:  As directed      Diet - low sodium heart healthy    Complete by:  As directed      Driving Restrictions    Complete by:  As directed   No driving for 3 days     Increase activity slowly    Complete by:  As directed      Lifting restrictions    Complete by:  As directed   No diving for 3 days            Medication List    STOP taking these medications       metoprolol succinate 25 MG 24 hr tablet  Commonly known as:  TOPROL-XL      TAKE these medications       albuterol 108 (90 BASE) MCG/ACT inhaler  Commonly known as:  PROVENTIL HFA;VENTOLIN HFA  Inhale 2 puffs into the lungs every 6 (six) hours as needed for wheezing.     aspirin 81 MG chewable tablet  Chew 1 tablet (81 mg total) by mouth daily.     atorvastatin 80 MG tablet  Commonly known as:  LIPITOR  Take 80 mg by mouth daily.     clonazePAM 0.5 MG tablet  Commonly known as:  KLONOPIN  Take 0.25-0.5 mg by mouth 2 (two) times daily as needed for anxiety.     glipiZIDE 10 MG tablet  Commonly known as:  GLUCOTROL  Take 10 mg by mouth 2 (two)  times daily before a meal.     hydrochlorothiazide 25 MG tablet  Commonly known as:  HYDRODIURIL  Take 25 mg by mouth daily.     HYDROcodone-acetaminophen 10-325 MG per tablet  Commonly known as:  NORCO  Take 1 tablet by mouth every 6 (six) hours as needed for moderate pain.     lisinopril 40 MG tablet  Commonly known as:  PRINIVIL,ZESTRIL  Take 40 mg by mouth daily.     metFORMIN 1000 MG tablet  Commonly known as:  GLUCOPHAGE  Take 1 tablet (1,000 mg total) by mouth 2 (two) times daily with a meal.  Start taking on:  02/16/2014     metoprolol tartrate 25 MG tablet  Commonly known as:  LOPRESSOR  Take 1 tablet (25 mg total) by mouth 2 (two) times daily.     nitroGLYCERIN 0.4 MG SL tablet  Commonly known as:  NITROSTAT  Place 1 tablet (0.4 mg total) under the tongue every 5 (five) minutes as needed for chest pain.     pantoprazole 40 MG tablet    Commonly known as:  PROTONIX  Take 1 tablet (40 mg total) by mouth daily.     prasugrel 10 MG Tabs tablet  Commonly known as:  EFFIENT  Take 1 tablet (10 mg total) by mouth daily.     prasugrel 10 MG Tabs tablet  Commonly known as:  EFFIENT  Take 1 tablet (10 mg total) by mouth daily.     sertraline 100 MG tablet  Commonly known as:  ZOLOFT  Take 150 mg by mouth daily.       Follow-up Information   Follow up with Lars MassonNELSON, KATARINA H, MD On 02/26/2014. (2:00 pm )    Specialty:  Cardiology   Contact information:   [address] [place] [id number] [phone]       TIME SPENT ON DISCHARGE, INCLUDING PHYSICIAN TIME: > 30 MINUTES  Signed: SIMMONS, BRITTAINY 02/13/2014, 10:32 AM  I saw and evaluated the patient's morning prior to discharge. I personally examined the patient and reviewed the only available data. I discussed the patient with Ms. Sharol HarnessSimmons, PA-C.  He was very stable from a post-PCI standpoint. No further angina. Radial cath site is normal. He had PCI to in-stent restenosis of the RCA stent. He also has disease in the LAD as well as circumflex that could be evaluated and treated if he has recurrent symptoms. He is otherwise stable for discharge.  I agree with the discharge summary.   Marykay LexHARDING,DAVID W, M.D., M.S. Interventional Cardiologist   Pager # [phone]

## 2014-02-13 NOTE — Progress Notes (Signed)
TR BAND REMOVAL  LOCATION:    right radial  DEFLATED PER PROTOCOL:    Yes.    TIME BAND OFF / DRESSING APPLIED:    21:00   SITE UPON ARRIVAL:    Level 0  SITE AFTER BAND REMOVAL:    Level 0  REVERSE ALLEN'S TEST:    CIRCULATION SENSATION AND MOVEMENT:    Within Normal Limits   Yes.    COMMENTS: pt tolerated removal of TR band without complications, will continue to monitor patient

## 2014-02-13 NOTE — Telephone Encounter (Signed)
Spoke with the [employer] insurance company and they stated that they will be faxing Dr Delton SeeNelson a form over for consideration of pt taking Brilinta verses Effient, due to coverage.  Informed [employer] of our fax number.  Will forward this message to Dr Delton SeeNelson for further review and recommendation.

## 2014-02-13 NOTE — Telephone Encounter (Signed)
New message    Need prior authorization on effient.  Form was faxed recently.

## 2014-02-14 NOTE — Telephone Encounter (Signed)
Faxed to [phone] and left confidential voice message at [phone] to pts insurance company [employer] that per Dr Delton SeeNelson this pt needs effient 10 mg instead of Brilinta because the pt has sever in-stent restenosis.  Will continue to follow-up.

## 2014-02-14 NOTE — Telephone Encounter (Signed)
Dwayne [name] can you look into this? He absolutely needs Effient as he has severe in-stent restenosis.

## 2014-02-17 DIAGNOSIS — Z1211 Encounter for screening for malignant neoplasm of colon: Secondary | ICD-10-CM | POA: Diagnosis not present

## 2014-02-17 NOTE — Telephone Encounter (Signed)
Per [employer], pts current medication insurance company, the pt has been approved for the medication of Effient 10 mg as of 02/11/14.  The pts prior authorization reference # is [id number]. The pts authorization is effective from 02/13/14 to 02/13/15, as long as he is enrolled as a member of this current health plan.  Pt notified of approval of effient.

## 2014-02-19 ENCOUNTER — Other Ambulatory Visit: Payer: Self-pay

## 2014-02-24 ENCOUNTER — Other Ambulatory Visit: Payer: Self-pay | Admitting: *Deleted

## 2014-02-24 ENCOUNTER — Encounter: Payer: Self-pay | Admitting: *Deleted

## 2014-02-24 ENCOUNTER — Other Ambulatory Visit: Payer: Self-pay

## 2014-02-24 DIAGNOSIS — F4321 Adjustment disorder with depressed mood: Secondary | ICD-10-CM | POA: Insufficient documentation

## 2014-02-24 DIAGNOSIS — E119 Type 2 diabetes mellitus without complications: Secondary | ICD-10-CM | POA: Insufficient documentation

## 2014-02-24 DIAGNOSIS — K219 Gastro-esophageal reflux disease without esophagitis: Secondary | ICD-10-CM | POA: Insufficient documentation

## 2014-02-26 ENCOUNTER — Encounter: Payer: Self-pay | Admitting: Cardiology

## 2014-03-06 ENCOUNTER — Ambulatory Visit (HOSPITAL_COMMUNITY): Payer: Self-pay

## 2014-03-12 ENCOUNTER — Ambulatory Visit (HOSPITAL_COMMUNITY): Payer: Self-pay

## 2014-03-14 ENCOUNTER — Ambulatory Visit (HOSPITAL_COMMUNITY): Payer: Self-pay

## 2014-03-19 ENCOUNTER — Ambulatory Visit (HOSPITAL_COMMUNITY): Payer: Self-pay

## 2014-03-21 ENCOUNTER — Ambulatory Visit (HOSPITAL_COMMUNITY): Payer: Self-pay

## 2014-03-26 ENCOUNTER — Ambulatory Visit (HOSPITAL_COMMUNITY): Payer: Self-pay

## 2014-03-28 ENCOUNTER — Ambulatory Visit (HOSPITAL_COMMUNITY): Payer: Self-pay

## 2014-03-28 ENCOUNTER — Ambulatory Visit (INDEPENDENT_AMBULATORY_CARE_PROVIDER_SITE_OTHER): Payer: Medicare Other | Admitting: Cardiology

## 2014-03-28 ENCOUNTER — Encounter: Payer: Self-pay | Admitting: Cardiology

## 2014-03-28 VITALS — BP 134/64 | HR 72 | Ht 70.0 in | Wt 269.0 lb

## 2014-03-28 DIAGNOSIS — Z720 Tobacco use: Secondary | ICD-10-CM

## 2014-03-28 DIAGNOSIS — I200 Unstable angina: Secondary | ICD-10-CM

## 2014-03-28 DIAGNOSIS — I25719 Atherosclerosis of autologous vein coronary artery bypass graft(s) with unspecified angina pectoris: Secondary | ICD-10-CM | POA: Diagnosis not present

## 2014-03-28 DIAGNOSIS — R0609 Other forms of dyspnea: Secondary | ICD-10-CM

## 2014-03-28 DIAGNOSIS — E785 Hyperlipidemia, unspecified: Secondary | ICD-10-CM

## 2014-03-28 DIAGNOSIS — R0600 Dyspnea, unspecified: Secondary | ICD-10-CM

## 2014-03-28 DIAGNOSIS — I10 Essential (primary) hypertension: Secondary | ICD-10-CM | POA: Diagnosis not present

## 2014-03-28 DIAGNOSIS — F17200 Nicotine dependence, unspecified, uncomplicated: Secondary | ICD-10-CM

## 2014-03-28 MED ORDER — ISOSORBIDE MONONITRATE ER 30 MG PO TB24: 30.0000 mg | ORAL_TABLET | Freq: Every day | ORAL | Status: DC

## 2014-03-28 NOTE — Patient Instructions (Addendum)
Your physician has recommended you make the following change in your medication:   START TAKING IMDUR 30 MG ONCE DAILY     Your physician recommends that you schedule a follow-up appointment in: 3 MONTHS WITH DR Delton SeeNELSON

## 2014-03-28 NOTE — Progress Notes (Signed)
Patient ID: Dwayne [name], male   DOB: 06/09/1945, 68 y.o.   MRN: [id number] Patient ID: Dwayne [name], male   DOB: 11/21/1945, 68 y.o.   MRN: [id number]     Patient Name: Dwayne [name] Date of Encounter: 03/28/2014  Primary Care Provider:  Farris HasMORROW, AARON, MD Primary Cardiologist:  Dwayne [name], Dwayne [name]  Patient Profile  [place]  Problem List   Past Medical History  Diagnosis Date  . Palpitations   . Hypertension, benign     Benign  . Hyperlipidemia   . Esophageal reflux   . Depression   . Anxiety   . Compression fracture of L2 03/08/2013  . Coronary artery disease   . Myocardial infarction 1998 X 2    "probably a couple since 1998" (02/12/2014)  . COPD (chronic obstructive pulmonary disease)   . Pneumonia ~ 2004 X 1  . IDDM (insulin dependent diabetes mellitus)   . Arthritis     "knees, elbows, hands" (02/12/2014)   Past Surgical History  Procedure Laterality Date  . Coronary angioplasty with stent placement  1999; 2014; 02/12/2014    "1; 2; 1"  . Coronary angioplasty  1998  . Cardiac catheterization  ~ 2012  . Cataract extraction w/ intraocular lens implant Right 10/2013    Allergies  Allergies  Allergen Reactions  . Morphine And Related Swelling    Swelling of the tongue and face    HPI  68 year old male with [name]/o IDDM, obesity, hypertension, hyperlipidemia, ongoing smoking and CAD [place] and is here to establish cardiology care. 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 He was doing well until 2012 when he had another episode chest pain and received stent to RCA another the next day for in stent restenosis. He was sterted on Prasugrel after that. He describes his pain as reflux but stronger. He states that since the last stent he has been asymptomatic. He is trying to exercise daily and has CP or SOB.    1 year followup -  12/17/2013 - the patient had a back injury in November 2014. He is otherwise doing well no chest pain, no shortness of breath, he walks mainly because of his back injury. He denies any lower extremity edema orthopnea medications. His only complaint is fatigue and sometimes low blood pressure.  03/28/2014 - the patient was seen in September for recurrent chest pains. He underwent nuclear stress test that was high risk stress test with moderate sized reversible inferior ischemia. He underwent cardiac catheterization on 08/13/2013 and was found to have severe in-stent restenosis in the proximal RCA stent. He received a stent and continued on dual antiplatelet therapy with aspirin and Effient. He is coming today refers that he is severely fatigued and short of breath. He states he didn't get relief he used to get when he got stents in the past. He also feels depressed and has hard time quitting smoking.  Home Medications  Prior to Admission medications   Medication Sig Start Date End Date Taking? Authorizing Provider  albuterol (PROVENTIL) (2.5 MG/3ML) 0.083% nebulizer solution Take 2.5 mg by nebulization every 6 (six) hours as needed for wheezing.   Yes Historical Provider, MD  atorvastatin (LIPITOR) 80 MG tablet Take 80 mg by mouth daily.   Yes Historical Provider, MD  clonazePAM (KLONOPIN) 0.5 MG tablet Take 0.5 mg by mouth 2 (  two) times daily as needed for anxiety.   Yes Historical Provider, MD  GLIPIZIDE ER PO Take 10 mg by mouth 2 (two) times daily.   Yes Historical Provider, MD  hydrochlorothiazide (HYDRODIURIL) 25 MG tablet Take 25 mg by mouth daily.   Yes Historical Provider, MD  Insulin Glargine (LANTUS [place]) Inject 14 Units into the skin daily.   Yes Historical Provider, MD  lisinopril (PRINIVIL,ZESTRIL) 40 MG tablet Take 40 mg by mouth daily.   Yes Historical Provider, MD  metFORMIN (GLUCOPHAGE) 1000 MG tablet Take 1,000 mg by mouth 2 (two) times daily with a meal.   Yes Historical Provider, MD    metoprolol (LOPRESSOR) 50 MG tablet Take 50 mg by mouth 2 (two) times daily.   Yes Historical Provider, MD  nitroGLYCERIN (NITROSTAT) 0.4 MG SL tablet Place 0.4 mg under the tongue every 5 (five) minutes as needed for chest pain.   Yes Historical Provider, MD  prasugrel (EFFIENT) 10 MG TABS tablet Take 10 mg by mouth daily.   Yes Historical Provider, MD  sertraline (ZOLOFT) 100 MG tablet Take 100 mg by mouth daily.   Yes Historical Provider, MD    Family History  No family history on file.  Social History  History   Social History  . Marital Status: Married    Spouse Name: N/A    Number of Children: N/A  . Years of Education: N/A   Occupational History  . Not on file.   Social History Main Topics  . Smoking status: Current Every Day Smoker -- 1.00 packs/day for 47 years    Types: Cigarettes  . Smokeless tobacco: Never Used  . Alcohol Use: Yes     Comment: "I was a drinker; I haven't had a drink since 1990"  . Drug Use: No  . Sexual Activity: No   Other Topics Concern  . Not on file   Social History Narrative     Review of Systems General:  No chills, fever, night sweats or weight changes.  Cardiovascular:  No chest pain, dyspnea on exertion, edema, orthopnea, palpitations, paroxysmal nocturnal dyspnea. Dermatological: No rash, lesions/masses Respiratory: No cough, dyspnea Urologic: No hematuria, dysuria Abdominal:   No nausea, vomiting, diarrhea, bright red blood per rectum, melena, or hematemesis Neurologic:  No visual changes, wkns, changes in mental status. All other systems reviewed and are otherwise negative except as noted above.  Physical Exam  Blood pressure 134/64, pulse 72, height 5\' 10"  (1.778 m), weight 269 lb (122.018 kg), SpO2 96 %.  General: Pleasant, NAD, obese Psych: Normal affect. Neuro: Alert and oriented X 3. Moves all extremities spontaneously. HEENT: Normal  Neck: Supple without bruits or JVD. Lungs:  Barrel chest, Resp regular and  unlabored, CTA.  Heart: RRR no s3, s4, or murmurs. Abdomen: Soft, non-tender, non-distended, BS + x 4. No bruit above AA  Extremities: No clubbing, cyanosis or edema. DP/PT/Radials 2+ and equal bilaterally.  Accessory Clinical Findings  ECG - SR, 1. AVB, old inferior MI  02/12/2014 LEFT VENTRICULOGRAM: Left ventricular angiogram was done in the 30 RAO projection and revealed normal LV size and function. EF 55%.   IMPRESSIONS: 1. 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 2. Moderate proximal to mid LAD and circumflex disease with lesions in the 50-75% stenosis range. The stent in the first diagonal is patent. There is 70% stenosis proximal to the stent margin. 3. Overall normal left ventricular function 4. Complicated but successful DES implantation the region of in-stent restenosis  in the right coronary reducing a greater than 95% stenosis to 0% with TIMI grade 3 flow.   RECOMMENDATION: Aspirin and Effient Discharge in a.m. If continued angina or recurrent right coronary restenosis, may need to consider coronary bypass surgery. If the right coronary stent is patent, LAD and circumflex could be treated with PCI when they become symptomatic.  I have encouraged the patient to discontinue smoking.  10 mics of IV nitroglycerin for 12 hours then discontinue to.    Assessment & Plan  68 year old male  1. CAD, S/P multiple stenting including RCA, diagonal, most recently obtained a stent to proximal RCA to her severe in-stent rest stenosis on 02/12/2014 , currently on dual antiplatelet therapy with ASA and prasugrel, continue BB, ACE, statin. He was referred to cardiac rehabilitation. We will also add Imdur 30 mg daily to his regimen.  2. IDDM -  Well controlled, HbA1c 6.3 %  3. Hypertension - well controlled, sometimes low, and fatigue, decrease  toprol XL to 25 mg PO daily  4. Hyperlipidemia - well controlled on atorvastatin 80 mh QHS, last values in 08/2012 LDL  45, HDL 45, TAG 119, recheck in 6 months  5. Smoking and depression - we will start Chantix he was given free sample box by her pharmacist.  Follow up in 3 months.  Dwayne [name], Jamaree Hosier H, MD 03/28/2014, 2:31 PM

## 2014-04-02 ENCOUNTER — Ambulatory Visit (HOSPITAL_COMMUNITY): Payer: Self-pay

## 2014-04-09 ENCOUNTER — Ambulatory Visit (HOSPITAL_COMMUNITY): Payer: Self-pay

## 2014-04-11 ENCOUNTER — Ambulatory Visit (HOSPITAL_COMMUNITY): Payer: Self-pay

## 2014-04-14 DIAGNOSIS — G4700 Insomnia, unspecified: Secondary | ICD-10-CM | POA: Diagnosis not present

## 2014-04-14 DIAGNOSIS — M2550 Pain in unspecified joint: Secondary | ICD-10-CM | POA: Diagnosis not present

## 2014-04-14 DIAGNOSIS — F411 Generalized anxiety disorder: Secondary | ICD-10-CM | POA: Diagnosis not present

## 2014-04-14 DIAGNOSIS — Z23 Encounter for immunization: Secondary | ICD-10-CM | POA: Diagnosis not present

## 2014-04-16 ENCOUNTER — Ambulatory Visit (HOSPITAL_COMMUNITY): Payer: Self-pay

## 2014-04-17 ENCOUNTER — Encounter (HOSPITAL_COMMUNITY): Payer: Self-pay | Admitting: Interventional Cardiology

## 2014-04-18 ENCOUNTER — Ambulatory Visit (HOSPITAL_COMMUNITY): Payer: Self-pay

## 2014-04-23 ENCOUNTER — Ambulatory Visit (HOSPITAL_COMMUNITY): Payer: Self-pay

## 2014-04-25 ENCOUNTER — Ambulatory Visit (HOSPITAL_COMMUNITY): Payer: Self-pay

## 2014-04-30 ENCOUNTER — Ambulatory Visit (HOSPITAL_COMMUNITY): Payer: Self-pay

## 2014-05-07 ENCOUNTER — Ambulatory Visit (HOSPITAL_COMMUNITY): Payer: Self-pay

## 2014-05-14 ENCOUNTER — Ambulatory Visit (HOSPITAL_COMMUNITY): Payer: Self-pay

## 2014-05-16 ENCOUNTER — Ambulatory Visit (HOSPITAL_COMMUNITY): Payer: Self-pay

## 2014-05-16 ENCOUNTER — Other Ambulatory Visit: Payer: Self-pay | Admitting: Family Medicine

## 2014-05-16 DIAGNOSIS — R918 Other nonspecific abnormal finding of lung field: Secondary | ICD-10-CM

## 2014-05-21 ENCOUNTER — Ambulatory Visit (HOSPITAL_COMMUNITY): Payer: Self-pay

## 2014-05-23 ENCOUNTER — Ambulatory Visit (HOSPITAL_COMMUNITY): Payer: Self-pay

## 2014-05-27 ENCOUNTER — Ambulatory Visit
Admission: RE | Admit: 2014-05-27 | Discharge: 2014-05-27 | Disposition: A | Discharge status: Home / Self Care | Payer: Medicare Other | Source: Ambulatory Visit | Attending: Family Medicine | Admitting: Family Medicine

## 2014-05-27 DIAGNOSIS — R918 Other nonspecific abnormal finding of lung field: Secondary | ICD-10-CM

## 2014-05-28 ENCOUNTER — Ambulatory Visit (HOSPITAL_COMMUNITY): Payer: Self-pay

## 2014-05-30 ENCOUNTER — Ambulatory Visit (HOSPITAL_COMMUNITY): Payer: Self-pay

## 2014-06-02 ENCOUNTER — Encounter: Payer: Self-pay | Admitting: Cardiology

## 2014-06-04 ENCOUNTER — Ambulatory Visit (HOSPITAL_COMMUNITY): Payer: Self-pay

## 2014-06-04 MED ORDER — CLOPIDOGREL BISULFATE 75 MG PO TABS: 75.0000 mg | ORAL_TABLET | Freq: Every day | ORAL | Status: DC

## 2014-06-04 NOTE — Telephone Encounter (Signed)
Per Dr Delton SeeNelson the pt can switch from Effient to Plavix 75 mg po daily due to cost effectiveness.  Notified the pt of these changes, and endorsed to him to d/c the Effient and start taking Plavix 75 mg po daily. Confirmed the pharmacy of choice with the pt.  Pt verbalized understanding and agrees with this plan.

## 2014-06-06 ENCOUNTER — Ambulatory Visit (HOSPITAL_COMMUNITY): Payer: Self-pay

## 2014-06-11 ENCOUNTER — Ambulatory Visit (HOSPITAL_COMMUNITY): Payer: Self-pay

## 2014-06-13 ENCOUNTER — Ambulatory Visit (HOSPITAL_COMMUNITY): Payer: Self-pay

## 2014-06-18 ENCOUNTER — Ambulatory Visit (HOSPITAL_COMMUNITY): Payer: Self-pay

## 2014-06-20 ENCOUNTER — Ambulatory Visit (HOSPITAL_COMMUNITY): Payer: Self-pay

## 2014-06-25 ENCOUNTER — Ambulatory Visit (HOSPITAL_COMMUNITY): Payer: Self-pay

## 2014-06-27 ENCOUNTER — Ambulatory Visit (HOSPITAL_COMMUNITY): Payer: Self-pay

## 2014-07-01 ENCOUNTER — Ambulatory Visit: Payer: Self-pay | Admitting: Cardiology

## 2014-07-02 ENCOUNTER — Ambulatory Visit (HOSPITAL_COMMUNITY): Payer: Self-pay

## 2014-07-04 ENCOUNTER — Ambulatory Visit (HOSPITAL_COMMUNITY): Payer: Self-pay

## 2014-07-09 ENCOUNTER — Ambulatory Visit (HOSPITAL_COMMUNITY): Payer: Self-pay

## 2014-07-11 ENCOUNTER — Ambulatory Visit (HOSPITAL_COMMUNITY): Payer: Self-pay

## 2014-07-30 ENCOUNTER — Ambulatory Visit (INDEPENDENT_AMBULATORY_CARE_PROVIDER_SITE_OTHER): Payer: Medicare Other | Admitting: Cardiology

## 2014-07-30 ENCOUNTER — Encounter: Payer: Self-pay | Admitting: Cardiology

## 2014-07-30 VITALS — BP 130/62 | HR 62 | Ht 70.0 in | Wt 272.0 lb

## 2014-07-30 DIAGNOSIS — E1140 Type 2 diabetes mellitus with diabetic neuropathy, unspecified: Secondary | ICD-10-CM | POA: Insufficient documentation

## 2014-07-30 DIAGNOSIS — E118 Type 2 diabetes mellitus with unspecified complications: Secondary | ICD-10-CM

## 2014-07-30 DIAGNOSIS — I25709 Atherosclerosis of coronary artery bypass graft(s), unspecified, with unspecified angina pectoris: Secondary | ICD-10-CM

## 2014-07-30 DIAGNOSIS — R9439 Abnormal result of other cardiovascular function study: Secondary | ICD-10-CM

## 2014-07-30 DIAGNOSIS — I10 Essential (primary) hypertension: Secondary | ICD-10-CM | POA: Diagnosis not present

## 2014-07-30 DIAGNOSIS — E1149 Type 2 diabetes mellitus with other diabetic neurological complication: Secondary | ICD-10-CM

## 2014-07-30 DIAGNOSIS — R931 Abnormal findings on diagnostic imaging of heart and coronary circulation: Secondary | ICD-10-CM | POA: Diagnosis not present

## 2014-07-30 MED ORDER — GABAPENTIN 300 MG PO CAPS: 300.0000 mg | ORAL_CAPSULE | Freq: Two times a day (BID) | ORAL | Status: DC

## 2014-07-30 NOTE — Patient Instructions (Signed)
Your physician has recommended you make the following change in your medication:    START TAKING NEURONTIN 300 MG TWICE DAILY      Your physician recommends that you schedule a follow-up appointment in: 3 MONTHS WITH DR Delton SeeNELSON

## 2014-07-30 NOTE — Progress Notes (Signed)
Patient ID: Dwayne MaterDennis Sobocinski, male   DOB: 01/13/1946, 69 y.o.   MRN: [id number]    Patient Name: Dwayne [name] Date of Encounter: 07/30/2014  Primary Care Provider:  Farris HasMORROW, AARON, MD Primary Cardiologist:  Lars MassonNELSON, Alysse Rathe H  Chief complain: Fatigue  Problem List   Past Medical History  Diagnosis Date  . Palpitations   . Hypertension, benign     Benign  . Hyperlipidemia   . Esophageal reflux   . Depression   . Anxiety   . Compression fracture of L2 03/08/2013  . Coronary artery disease   . Myocardial infarction 1998 X 2    "probably a couple since 1998" (02/12/2014)  . COPD (chronic obstructive pulmonary disease)   . Pneumonia ~ 2004 X 1  . IDDM (insulin dependent diabetes mellitus)   . Arthritis     "knees, elbows, hands" (02/12/2014)   Past Surgical History  Procedure Laterality Date  . Coronary angioplasty with stent placement  1999; 2014; 02/12/2014    "1; 2; 1"  . Coronary angioplasty  1998  . Cardiac catheterization  ~ 2012  . Cataract extraction w/ intraocular lens implant Right 10/2013  . Left heart catheterization with coronary angiogram N/A 02/12/2014    Procedure: LEFT HEART CATHETERIZATION WITH CORONARY ANGIOGRAM;  Surgeon: Lesleigh NoeHenry W Smith III, MD;  [facility];  Service: Cardiovascular;  Laterality: N/A;    Allergies  Allergies  Allergen Reactions  . Morphine And Related Swelling    Swelling of the tongue and face    HPI  69 year old male with h/o IDDM, obesity, hypertension, hyperlipidemia, ongoing smoking and CAD [facility] and is here to establish cardiology care. 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 He was doing well until 2012 when he had another episode chest pain and received stent to RCA another the next day for in stent restenosis. He was sterted on Prasugrel after that. He describes his pain as reflux but stronger. He states that since the  last stent he has been asymptomatic. He is trying to exercise daily and has CP or SOB.    1 year followup - 12/17/2013 - the patient had a back injury in November 2014. He is otherwise doing well no chest pain, no shortness of breath, he walks mainly because of his back injury. He denies any lower extremity edema orthopnea medications. His only complaint is fatigue and sometimes low blood pressure.  03/28/2014 - the patient was seen in September for recurrent chest pains. He underwent nuclear stress test that was high risk stress test with moderate sized reversible inferior ischemia. He underwent cardiac catheterization on 08/13/2013 and was found to have severe in-stent restenosis in the proximal RCA stent. He received a stent and continued on dual antiplatelet therapy with aspirin and Effient. He is coming today refers that he is severely fatigued and short of breath. He states he didn't get relief he used to get when he got stents in the past. He also feels depressed and has hard time quitting smoking.  07/30/2014 - the patient is coming after 4 months, he denies any chest pain, he shortness of breath has improved, however he states that he has been feeling down, with no energy and fatigue. He stopped using narcotics for back pain that has resolved. He states that lately he hasn't been exercising at all and he has been eating a  lot of junk food that lead to 20 pound weight gain. He denies any palpitations or syncope. His bruising of his upper extremities has improved on Plavix when compared to Brilinta, he is compliant with his medicines. The patient is complaining of tingling and burning in all of his extremities.   Home Medications  Prior to Admission medications   Medication Sig Start Date End Date Taking? Authorizing Provider  albuterol (PROVENTIL) (2.5 MG/3ML) 0.083% nebulizer solution Take 2.5 mg by nebulization every 6 (six) hours as needed for wheezing.   Yes Historical Provider, MD    atorvastatin (LIPITOR) 80 MG tablet Take 80 mg by mouth daily.   Yes Historical Provider, MD  clonazePAM (KLONOPIN) 0.5 MG tablet Take 0.5 mg by mouth 2 (two) times daily as needed for anxiety.   Yes Historical Provider, MD  GLIPIZIDE ER PO Take 10 mg by mouth 2 (two) times daily.   Yes Historical Provider, MD  hydrochlorothiazide (HYDRODIURIL) 25 MG tablet Take 25 mg by mouth daily.   Yes Historical Provider, MD  Insulin Glargine (LANTUS [place]) Inject 14 Units into the skin daily.   Yes Historical Provider, MD  lisinopril (PRINIVIL,ZESTRIL) 40 MG tablet Take 40 mg by mouth daily.   Yes Historical Provider, MD  metFORMIN (GLUCOPHAGE) 1000 MG tablet Take 1,000 mg by mouth 2 (two) times daily with a meal.   Yes Historical Provider, MD  metoprolol (LOPRESSOR) 50 MG tablet Take 50 mg by mouth 2 (two) times daily.   Yes Historical Provider, MD  nitroGLYCERIN (NITROSTAT) 0.4 MG SL tablet Place 0.4 mg under the tongue every 5 (five) minutes as needed for chest pain.   Yes Historical Provider, MD  prasugrel (EFFIENT) 10 MG TABS tablet Take 10 mg by mouth daily.   Yes Historical Provider, MD  sertraline (ZOLOFT) 100 MG tablet Take 100 mg by mouth daily.   Yes Historical Provider, MD    Family History  Family History  Problem Relation Age of Onset  . Heart attack Father   . Stroke Neg Hx   . Diabetes Father     Social History  History   Social History  . Marital Status: Married    Spouse Name: N/A  . Number of Children: N/A  . Years of Education: N/A   Occupational History  . Not on file.   Social History Main Topics  . Smoking status: Current Every Day Smoker -- 1.00 packs/day for 47 years    Types: Cigarettes  . Smokeless tobacco: Never Used  . Alcohol Use: Yes     Comment: "I was a drinker; I haven't had a drink since 1990"  . Drug Use: No  . Sexual Activity: No   Other Topics Concern  . Not on file   Social History Narrative     Review of Systems General:  No chills,  fever, night sweats or weight changes.  Cardiovascular:  No chest pain, dyspnea on exertion, edema, orthopnea, palpitations, paroxysmal nocturnal dyspnea. Dermatological: No rash, lesions/masses Respiratory: No cough, dyspnea Urologic: No hematuria, dysuria Abdominal:   No nausea, vomiting, diarrhea, bright red blood per rectum, melena, or hematemesis Neurologic:  No visual changes, wkns, changes in mental status. All other systems reviewed and are otherwise negative except as noted above.  Physical Exam  Blood pressure 130/62, pulse 62, height  (1.778 m), weight 272 lb (123.378 kg).  General: Pleasant, NAD, obese Psych: Normal affect. Neuro: Alert and oriented X 3. Moves all extremities spontaneously. HEENT: Normal  Neck: Supple  without bruits or JVD. Lungs:  Barrel chest, Resp regular and unlabored, CTA.  Heart: RRR no s3, s4, or murmurs. Abdomen: Soft, non-tender, non-distended, BS + x 4. No bruit above AA  Extremities: No clubbing, cyanosis or edema. DP/PT/Radials 2+ and equal bilaterally.  Accessory Clinical Findings  02/12/2014 LEFT VENTRICULOGRAM: Left ventricular angiogram was done in the 30 RAO projection and revealed normal LV size and function. EF 55%.   IMPRESSIONS: 1. 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 2. Moderate proximal to mid LAD and circumflex disease with lesions in the 50-75% stenosis range. The stent in the first diagonal is patent. There is 70% stenosis proximal to the stent margin. 3. Overall normal left ventricular function 4. Complicated but successful DES implantation the region of in-stent restenosis in the right coronary reducing a greater than 95% stenosis to 0% with TIMI grade 3 flow.   RECOMMENDATION: Aspirin and Effient Discharge in a.m. If continued angina or recurrent right coronary restenosis, may need to consider coronary bypass surgery. If the right coronary stent is patent, LAD and circumflex could be treated  with PCI when they become symptomatic.  I have encouraged the patient to discontinue smoking.  10 mics of IV nitroglycerin for 12 hours then discontinue to.  ECG - SR, 1. AVB, old inferior MI, unchanged from 02/23/2014    Assessment & Plan  69 year old male  1. CAD, S/P multiple stenting including RCA, diagonal, most recently obtained a stent to proximal RCA to her severe in-stent rest stenosis on 02/12/2014, currently on dual antiplatelet therapy with ASA and plavix, continue BB, ACE, statin, Imdur 30 mg daily. He is encouraged to start exercising and change diet.   2. IDDM -  poorly controlled, HbA1c 7.9 from 6.3 the last year, poor eating habits, encouraged to change.   3. Hypertension - well controlled, sometimes low, and fatigue.  4. Hyperlipidemia - well controlled on atorvastatin 80 mh QHS, last values in 08/2012 LDL 45, HDL 45, TAG 119, recheck in 6 months  5. Diabetic neuropathy - start neurontin 300 mg po BID  5. Smoking and depression - we will start Chantix he was given free sample box by her pharmacist.  Follow up in 3 months.  Lars Masson, MD 07/30/2014, 2:05 PM

## 2014-08-21 ENCOUNTER — Other Ambulatory Visit: Payer: Self-pay

## 2014-08-21 MED ORDER — METOPROLOL SUCCINATE ER 25 MG PO TB24: 25.0000 mg | ORAL_TABLET | Freq: Every day | ORAL | Status: DC

## 2014-08-25 ENCOUNTER — Telehealth: Payer: Self-pay | Admitting: Cardiology

## 2014-08-25 DIAGNOSIS — E785 Hyperlipidemia, unspecified: Secondary | ICD-10-CM

## 2014-08-25 DIAGNOSIS — I25709 Atherosclerosis of coronary artery bypass graft(s), unspecified, with unspecified angina pectoris: Secondary | ICD-10-CM

## 2014-08-25 DIAGNOSIS — I10 Essential (primary) hypertension: Secondary | ICD-10-CM

## 2014-08-25 NOTE — Telephone Encounter (Signed)
Pt calling to obtain a clarification on whether he should be taking Toprol XL 25 mg po daily or should he be taking metoprolol tartrate 25 mg po bid.  Pt states he has been both but just recently called our office for a refill and Toprol xl 25 mg po daily was sent into his pharmacy.  Pt would like for Dr Delton SeeNelson to advise on which Metoprolol he should be taking.  Also pt would like for his Lipids to be checked because he states this hasn't been done in 2 years. Pt is on a statin.  Informed the pt that Dr Delton SeeNelson is currently out of the office, but I will send her a message for clarification on his Metoprolol and if a order for lipids is needed at this time and follow-up with the pt thereafter. Pt verbalized understanding and agrees with this plan.

## 2014-08-25 NOTE — Telephone Encounter (Signed)
New message         Pt would like to know if he is taking extended release medication   is pt stopping metoprolol

## 2014-08-26 ENCOUNTER — Encounter (HOSPITAL_COMMUNITY)
Admission: RE | Admit: 2014-08-26 | Discharge: 2014-08-26 | Disposition: A | Discharge status: Home / Self Care | Payer: Medicare Other | Source: Ambulatory Visit | Attending: Cardiology | Admitting: Cardiology

## 2014-08-26 NOTE — Progress Notes (Signed)
Pt in for cardiac rehab orientation.  Medication list reconciled.  Pt reports he is not taking gabapentin due to fear of potential side effects.  Pt has good recall of his medications, verbalizes name, dose, indications and side effects. Pt researches his medication online for indication and side effects.  Pt does not appear to have medication compliance concerns.

## 2014-08-26 NOTE — Telephone Encounter (Signed)
1. Toprol 25 mg po daily 2. Please order fasting lipids, CMP, CBC and TSH, thank you!

## 2014-08-26 NOTE — Telephone Encounter (Signed)
Informed the pt that per Dr Delton SeeNelson he should be taking Toprol XL 25 mg po daily and he should come in for lab work to check fasting lipids, cmet, CBC w diff, and TSH.  Pt states he has enough Toprol xl 25 mg on hand right now and does not require any refills at the moment.  Pt would like to come in for his lab appt for 09/02/14 and is aware to come fasting.  Lab appt made in appt desk.  Pt verbalized understanding and agrees with this plan.

## 2014-09-01 ENCOUNTER — Encounter (HOSPITAL_COMMUNITY): Admission: RE | Admit: 2014-09-01 | Payer: Medicare Other | Source: Ambulatory Visit

## 2014-09-02 ENCOUNTER — Other Ambulatory Visit: Payer: Medicare Other

## 2014-09-02 ENCOUNTER — Telehealth: Payer: Self-pay | Admitting: Cardiology

## 2014-09-02 NOTE — Telephone Encounter (Signed)
Pt calling in to discuss his lab appt that he requested for Dr Delton SeeNelson to do on previous telephone conversation on 4/18.  Pt states he went to his PCP today and had some labs done, but not sure what they drew.  Pt states he for sure knows that his PCP did not do fasting lipids. Pt requesting to still have ordered labs per Dr Delton SeeNelson, but would like for his lab appt to be changed to tomorrow 4/27.  Pt states he will then come in early and fasting as instructed to. Pt states that he will contact his PCP to confirm which labs were drawn today, and sign a medical release form there, so we can receive results for Dr Delton SeeNelson to review.  Informed the pt that I will switch his lab appt in the system for tomorrow.  Informed the pt that we will keep ordered labs as is until he confirms which one's were drawn by his PCP today.  Pt verbalized understanding and agrees with this plan.

## 2014-09-02 NOTE — Telephone Encounter (Signed)
New message     Pt is due to have labs drawn today.  He has lab results from his PCP.  He want to see if he still needs to come in for our labs.

## 2014-09-03 ENCOUNTER — Other Ambulatory Visit: Payer: Medicare Other

## 2014-09-03 ENCOUNTER — Telehealth (HOSPITAL_COMMUNITY): Payer: Self-pay | Admitting: *Deleted

## 2014-09-03 ENCOUNTER — Encounter (HOSPITAL_COMMUNITY)
Admission: RE | Admit: 2014-09-03 | Discharge: 2014-09-03 | Disposition: A | Discharge status: Home / Self Care | Payer: Medicare Other | Source: Ambulatory Visit

## 2014-09-03 NOTE — Progress Notes (Signed)
Dwayne [name] [age] y.o. male  Nutrition Note Spoke with pt over the phone. Pt started Lantus at night time last night. Pt called due to c/[facility]. Pt treated hypoglycemia with "2 cookies." Treatment of hypoglycemia discussed. Pt states he has previously taken insulin. Pt was unsure about coming to cardiac rehab because "I don't know how my sugar is going to do." Pt encouraged to monitor CBG's today and tomorrow and plan on starting rehab on Friday.  No results found for: HGBA1C  Continue client-centered nutrition education by RD as part of interdisciplinary care.  Monitor and evaluate progress toward nutrition goal with team.  Mickle PlumbEdna Nomar Broad, M.Ed, RD, LDN, CDE 09/03/2014 12:18 PM

## 2014-09-04 ENCOUNTER — Encounter: Payer: Self-pay | Admitting: Cardiology

## 2014-09-05 ENCOUNTER — Encounter (HOSPITAL_COMMUNITY): Payer: Medicare Other

## 2014-09-05 ENCOUNTER — Telehealth (HOSPITAL_COMMUNITY): Payer: Self-pay | Admitting: Cardiac Rehabilitation

## 2014-09-05 NOTE — Telephone Encounter (Signed)
pc received from absent from cardiac rehab today due to diarrhea. Pt symptoms started yesterday.  Pt advised to follow sick day instructions for his insulin and increase PO fluid intake, avoiding dairy and fatty foods.  Pt also instructed to return to cardiac rehab once GI symptoms resolved 48 hours. Understanding verbalized

## 2014-09-08 ENCOUNTER — Encounter (HOSPITAL_COMMUNITY): Payer: Medicare Other

## 2014-09-10 ENCOUNTER — Encounter (HOSPITAL_COMMUNITY): Payer: Medicare Other

## 2014-09-10 ENCOUNTER — Telehealth (HOSPITAL_COMMUNITY): Payer: Self-pay | Admitting: Cardiac Rehabilitation

## 2014-09-10 NOTE — Telephone Encounter (Signed)
pc received from pt that he will be absent from cardiac rehab today. Pt c/o fatigue and is concerned his blood sugar is too high.  It was 189 2 hours after eating. Pt reports he talked to his MD office who advised he continue his current regimen.  Pt reports he has felt fatigue since beginning insulin. Pt does not wish to come to cardiac rehab until he gets his blood sugar and insulin regimen controlled.  Pt advised to schedule appt with his PCP if symptoms of fatigue unrelieved.  Pt also reassured about his postprandial CBG reading today.  Pt will would to be placed on hold for cardiac rehab at this time. Pt instructed to contact cardiac rehab when he is ready to reschedule.  Understanding verbalized.

## 2014-09-12 ENCOUNTER — Encounter (HOSPITAL_COMMUNITY): Payer: Medicare Other

## 2014-09-15 ENCOUNTER — Encounter (HOSPITAL_COMMUNITY): Payer: Medicare Other

## 2014-09-17 ENCOUNTER — Encounter (HOSPITAL_COMMUNITY): Payer: Medicare Other

## 2014-09-19 ENCOUNTER — Encounter (HOSPITAL_COMMUNITY): Payer: Medicare Other

## 2014-09-22 ENCOUNTER — Encounter (HOSPITAL_COMMUNITY): Payer: Medicare Other

## 2014-09-24 ENCOUNTER — Encounter (HOSPITAL_COMMUNITY): Payer: Medicare Other

## 2014-09-26 ENCOUNTER — Encounter (HOSPITAL_COMMUNITY): Payer: Medicare Other

## 2014-09-29 ENCOUNTER — Encounter (HOSPITAL_COMMUNITY): Payer: Medicare Other

## 2014-10-01 ENCOUNTER — Encounter (HOSPITAL_COMMUNITY): Payer: Medicare Other

## 2014-10-03 ENCOUNTER — Encounter (HOSPITAL_COMMUNITY): Payer: Medicare Other

## 2014-10-08 ENCOUNTER — Encounter (HOSPITAL_COMMUNITY): Payer: Medicare Other

## 2014-10-10 ENCOUNTER — Encounter (HOSPITAL_COMMUNITY): Payer: Medicare Other

## 2014-10-13 ENCOUNTER — Encounter (HOSPITAL_COMMUNITY): Payer: Medicare Other

## 2014-10-15 ENCOUNTER — Encounter (HOSPITAL_COMMUNITY): Payer: Medicare Other

## 2014-10-17 ENCOUNTER — Encounter (HOSPITAL_COMMUNITY): Payer: Medicare Other

## 2014-10-20 ENCOUNTER — Encounter (HOSPITAL_COMMUNITY): Payer: Medicare Other

## 2014-10-22 ENCOUNTER — Encounter (HOSPITAL_COMMUNITY): Payer: Medicare Other

## 2014-10-24 ENCOUNTER — Encounter (HOSPITAL_COMMUNITY): Payer: Medicare Other

## 2014-10-27 ENCOUNTER — Encounter (HOSPITAL_COMMUNITY): Payer: Medicare Other

## 2014-10-28 ENCOUNTER — Ambulatory Visit: Payer: Medicare Other | Admitting: Cardiology

## 2014-10-29 ENCOUNTER — Encounter (HOSPITAL_COMMUNITY): Payer: Medicare Other

## 2014-10-31 ENCOUNTER — Encounter (HOSPITAL_COMMUNITY): Payer: Medicare Other

## 2014-11-03 ENCOUNTER — Encounter (HOSPITAL_COMMUNITY): Payer: Medicare Other

## 2014-11-05 ENCOUNTER — Encounter (HOSPITAL_COMMUNITY): Payer: Medicare Other

## 2014-11-07 ENCOUNTER — Encounter (HOSPITAL_COMMUNITY): Payer: Medicare Other

## 2014-11-12 ENCOUNTER — Encounter (HOSPITAL_COMMUNITY): Payer: Medicare Other

## 2014-11-14 ENCOUNTER — Encounter (HOSPITAL_COMMUNITY): Payer: Medicare Other

## 2014-11-17 ENCOUNTER — Encounter (HOSPITAL_COMMUNITY): Payer: Medicare Other

## 2014-11-19 ENCOUNTER — Encounter (HOSPITAL_COMMUNITY): Payer: Medicare Other

## 2014-11-21 ENCOUNTER — Encounter (HOSPITAL_COMMUNITY): Payer: Medicare Other

## 2014-11-24 ENCOUNTER — Encounter (HOSPITAL_COMMUNITY): Payer: Medicare Other

## 2014-11-26 ENCOUNTER — Encounter (HOSPITAL_COMMUNITY): Payer: Medicare Other

## 2014-11-28 ENCOUNTER — Encounter (HOSPITAL_COMMUNITY): Payer: Medicare Other

## 2014-12-01 ENCOUNTER — Encounter (HOSPITAL_COMMUNITY): Payer: Medicare Other

## 2014-12-03 ENCOUNTER — Encounter (HOSPITAL_COMMUNITY): Payer: Medicare Other

## 2014-12-05 ENCOUNTER — Encounter: Payer: Self-pay | Admitting: Cardiology

## 2014-12-05 ENCOUNTER — Encounter (HOSPITAL_COMMUNITY): Payer: Medicare Other

## 2014-12-26 ENCOUNTER — Ambulatory Visit (INDEPENDENT_AMBULATORY_CARE_PROVIDER_SITE_OTHER): Payer: Medicare Other | Admitting: Cardiology

## 2014-12-26 ENCOUNTER — Encounter: Payer: Self-pay | Admitting: Cardiology

## 2014-12-26 VITALS — BP 124/74 | HR 75 | Ht 69.0 in | Wt 280.0 lb

## 2014-12-26 DIAGNOSIS — I2510 Atherosclerotic heart disease of native coronary artery without angina pectoris: Secondary | ICD-10-CM | POA: Diagnosis not present

## 2014-12-26 DIAGNOSIS — J441 Chronic obstructive pulmonary disease with (acute) exacerbation: Secondary | ICD-10-CM | POA: Diagnosis not present

## 2014-12-26 DIAGNOSIS — E785 Hyperlipidemia, unspecified: Secondary | ICD-10-CM | POA: Diagnosis not present

## 2014-12-26 DIAGNOSIS — I10 Essential (primary) hypertension: Secondary | ICD-10-CM

## 2014-12-26 DIAGNOSIS — J440 Chronic obstructive pulmonary disease with acute lower respiratory infection: Principal | ICD-10-CM

## 2014-12-26 DIAGNOSIS — J209 Acute bronchitis, unspecified: Secondary | ICD-10-CM

## 2014-12-26 DIAGNOSIS — I2583 Coronary atherosclerosis due to lipid rich plaque: Secondary | ICD-10-CM

## 2014-12-26 MED ORDER — TIOTROPIUM BROMIDE MONOHYDRATE 18 MCG IN CAPS: 18.0000 ug | ORAL_CAPSULE | Freq: Every day | RESPIRATORY_TRACT | Status: DC

## 2014-12-26 NOTE — Patient Instructions (Signed)
Medication Instructions:   START USING SPIRIVA 18 MCG INHALE THIS ONCE DAILY        Follow-Up:  Your physician wants you to follow-up in: 6 MONTHS WITH DR Johnell Comings will receive a reminder letter in the mail two months in advance. If you don't receive a letter, please call our office to schedule the follow-up appointment.

## 2014-12-26 NOTE — Progress Notes (Signed)
Patient ID: Dwayne [name], male   DOB: December 09, 1945, 69 y.o.   MRN: [id number] Patient ID: Dwayne [name], male   DOB: 02-21-1946, 69 y.o.   MRN: [id number]    Patient Name: Dwayne [name] Date of Encounter: 12/26/2014  Primary Care Provider:  Farris Has, MD Primary Cardiologist:  Lars Masson  Chief complain: Fatigue  Problem List   Past Medical History  Diagnosis Date  . Palpitations   . Hypertension, benign     Benign  . Hyperlipidemia   . Esophageal reflux   . Depression   . Anxiety   . Compression fracture of L2 03/08/2013  . Coronary artery disease   . Myocardial infarction 1998 X 2    "probably a couple since 1998" (02/12/2014)  . COPD (chronic obstructive pulmonary disease)   . Pneumonia ~ 2004 X 1  . IDDM (insulin dependent diabetes mellitus)   . Arthritis     "knees, elbows, hands" (02/12/2014)   Past Surgical History  Procedure Laterality Date  . Coronary angioplasty with stent placement  1999; 2014; 02/12/2014    "1; 2; 1"  . Coronary angioplasty  1998  . Cardiac catheterization  ~ 2012  . Cataract extraction w/ intraocular lens implant Right 10/2013  . Left heart catheterization with coronary angiogram N/A 02/12/2014    Procedure: LEFT HEART CATHETERIZATION WITH CORONARY ANGIOGRAM;  Surgeon: Lesleigh Noe, MD;  Location: [facility] CATH LAB;  Service: Cardiovascular;  Laterality: N/A;    Allergies  Allergies  Allergen Reactions  . Morphine And Related Swelling    Swelling of the tongue and face    HPI  69 year old male with h/o IDDM, obesity, hypertension, hyperlipidemia, ongoing smoking and CAD who just moved to [facility] and is here to establish cardiology care. 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 He was doing well until 2012 when he had another episode chest pain and received stent to RCA another the next day for in stent restenosis. He was sterted on Prasugrel after  that. He describes his pain as reflux but stronger. He states that since the last stent he has been asymptomatic. He is trying to exercise daily and has CP or SOB.    1 year followup - 12/17/2013 - the patient had a back injury in November 2014. He is otherwise doing well no chest pain, no shortness of breath, he walks mainly because of his back injury. He denies any lower extremity edema orthopnea medications. His only complaint is fatigue and sometimes low blood pressure.  03/28/2014 - the patient was seen in September for recurrent chest pains. He underwent nuclear stress test that was high risk stress test with moderate sized reversible inferior ischemia. He underwent cardiac catheterization on 08/13/2013 and was found to have severe in-stent restenosis in the proximal RCA stent. He received a stent and continued on dual antiplatelet therapy with aspirin and Effient. He is coming today refers that he is severely fatigued and short of breath. He states he didn't get relief he used to get when he got stents in the past. He also feels depressed and has hard time quitting smoking.  07/30/2014 - the patient is coming after 4 months, he denies any chest pain, he shortness of breath has improved, however he states that he has been feeling down, with no energy and fatigue. He stopped using narcotics for back pain that has resolved. He  states that lately he hasn't been exercising at all and he has been eating a lot of junk food that lead to 20 pound weight gain. He denies any palpitations or syncope. His bruising of his upper extremities has improved on Plavix when compared to Brilinta, he is compliant with his medicines. The patient is complaining of tingling and burning in all of his extremities.  12/26/14 - 6 months follow up, feels the same, walks minimally with a cane, because of diabetic neuropathy and balance problems. No chest pain, stable DOE, no PND, orthopnea. Easy bruising with DAPT.  Home  Medications  Prior to Admission medications   Medication Sig Start Date End Date Taking? Authorizing Provider  albuterol (PROVENTIL) (2.5 MG/3ML) 0.083% nebulizer solution Take 2.5 mg by nebulization every 6 (six) hours as needed for wheezing.   Yes Historical Provider, MD  atorvastatin (LIPITOR) 80 MG tablet Take 80 mg by mouth daily.   Yes Historical Provider, MD  clonazePAM (KLONOPIN) 0.5 MG tablet Take 0.5 mg by mouth 2 (two) times daily as needed for anxiety.   Yes Historical Provider, MD  GLIPIZIDE ER PO Take 10 mg by mouth 2 (two) times daily.   Yes Historical Provider, MD  hydrochlorothiazide (HYDRODIURIL) 25 MG tablet Take 25 mg by mouth daily.   Yes Historical Provider, MD  Insulin Glargine (LANTUS [place]) Inject 14 Units into the skin daily.   Yes Historical Provider, MD  lisinopril (PRINIVIL,ZESTRIL) 40 MG tablet Take 40 mg by mouth daily.   Yes Historical Provider, MD  metFORMIN (GLUCOPHAGE) 1000 MG tablet Take 1,000 mg by mouth 2 (two) times daily with a meal.   Yes Historical Provider, MD  metoprolol (LOPRESSOR) 50 MG tablet Take 50 mg by mouth 2 (two) times daily.   Yes Historical Provider, MD  nitroGLYCERIN (NITROSTAT) 0.4 MG SL tablet Place 0.4 mg under the tongue every 5 (five) minutes as needed for chest pain.   Yes Historical Provider, MD  prasugrel (EFFIENT) 10 MG TABS tablet Take 10 mg by mouth daily.   Yes Historical Provider, MD  sertraline (ZOLOFT) 100 MG tablet Take 100 mg by mouth daily.   Yes Historical Provider, MD    Family History  Family History  Problem Relation Age of Onset  . Heart attack Father   . Stroke Neg Hx   . Diabetes Father     Social History  Social History   Social History  . Marital Status: Married    Spouse Name: N/A  . Number of Children: N/A  . Years of Education: N/A   Occupational History  . Not on file.   Social History Main Topics  . Smoking status: Current Every Day Smoker -- 1.00 packs/day for 47 years    Types: Cigarettes   . Smokeless tobacco: Never Used  . Alcohol Use: Yes     Comment: "I was a drinker; I haven't had a drink since 1990"  . Drug Use: No  . Sexual Activity: No   Other Topics Concern  . Not on file   Social History Narrative     Review of Systems General:  No chills, fever, night sweats or weight changes.  Cardiovascular:  No chest pain, dyspnea on exertion, edema, orthopnea, palpitations, paroxysmal nocturnal dyspnea. Dermatological: No rash, lesions/masses Respiratory: No cough, dyspnea Urologic: No hematuria, dysuria Abdominal:   No nausea, vomiting, diarrhea, bright red blood per rectum, melena, or hematemesis Neurologic:  No visual changes, wkns, changes in mental status. All other systems reviewed and are  otherwise negative except as noted above.  Physical Exam  Blood pressure 124/74, pulse 75, height  (1.753 m), weight 280 lb (127.007 kg), SpO2 96 %.  General: Pleasant, NAD, obese Psych: Normal affect. Neuro: Alert and oriented X 3. Moves all extremities spontaneously. HEENT: Normal  Neck: Supple without bruits or JVD. Lungs:  Barrel chest, Resp regular and unlabored, wheezing B/L.  Heart: RRR no s3, s4, or murmurs. Abdomen: Soft, non-tender, non-distended, BS + x 4. No bruit above AA  Extremities: No clubbing, cyanosis or edema. DP/PT/Radials 2+ and equal bilaterally.  Accessory Clinical Findings  02/12/2014 LEFT VENTRICULOGRAM: Left ventricular angiogram was done in the 30 RAO projection and revealed normal LV size and function. EF 55%.   IMPRESSIONS: 1. 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 2. Moderate proximal to mid LAD and circumflex disease with lesions in the 50-75% stenosis range. The stent in the first diagonal is patent. There is 70% stenosis proximal to the stent margin. 3. Overall normal left ventricular function 4. Complicated but successful DES implantation the region of in-stent restenosis in the right coronary reducing a  greater than 95% stenosis to 0% with TIMI grade 3 flow.   RECOMMENDATION: Aspirin and Effient Discharge in a.m. If continued angina or recurrent right coronary restenosis, may need to consider coronary bypass surgery. If the right coronary stent is patent, LAD and circumflex could be treated with PCI when they become symptomatic.  I have encouraged the patient to discontinue smoking.  10 mics of IV nitroglycerin for 12 hours then discontinue to.  ECG - SR, 1. AVB, old inferior MI, unchanged from 02/23/2014    Assessment & Plan  69 year old male  1. CAD, S/P multiple stenting including RCA, diagonal, most recently obtained a stent to proximal RCA to her severe in-stent rest stenosis on 02/12/2014, currently on dual antiplatelet therapy with ASA and plavix, continue BB, ACE, statin, Imdur 30 mg daily. He is encouraged to start exercising and change diet.  Bruising, encouraged to use ASA/Plavix DAPT at least till October.  2. IDDM -  poorly controlled, HbA1c 7.9 from 6.3 the last year, poor eating habits, encouraged to change.   3. Hypertension - well controlled, sometimes low, and fatigue.  4. Hyperlipidemia - well controlled on atorvastatin 80 mh QHS, last values in 08/2012 LDL 45, HDL 45, TAG 119, recheck in 6 months  5. Diabetic neuropathy - started neurontin 300 mg po BID, he is not taking it, no improvement  5. Smoking and depression - started on Chantix , continues to smoke, he is smoking today on albuterol PRN only, we will start Spiriva.  Follow up in 6 months.  Lars Masson, MD 12/26/2014, 3:03 PM

## 2015-02-03 ENCOUNTER — Institutional Professional Consult (permissible substitution): Payer: Self-pay | Admitting: Pulmonary Disease

## 2015-02-11 ENCOUNTER — Ambulatory Visit (INDEPENDENT_AMBULATORY_CARE_PROVIDER_SITE_OTHER): Payer: Medicare Other | Admitting: Pulmonary Disease

## 2015-02-11 ENCOUNTER — Encounter: Payer: Self-pay | Admitting: Pulmonary Disease

## 2015-02-11 VITALS — BP 128/68 | HR 68 | Ht 69.0 in | Wt 282.0 lb

## 2015-02-11 DIAGNOSIS — J441 Chronic obstructive pulmonary disease with (acute) exacerbation: Secondary | ICD-10-CM | POA: Diagnosis not present

## 2015-02-11 DIAGNOSIS — Z122 Encounter for screening for malignant neoplasm of respiratory organs: Secondary | ICD-10-CM

## 2015-02-11 DIAGNOSIS — Z87891 Personal history of nicotine dependence: Secondary | ICD-10-CM | POA: Diagnosis not present

## 2015-02-11 NOTE — Patient Instructions (Signed)
We will refer you to lung cancer screening with CT scans. You will be scheduled for lung function tests.

## 2015-02-11 NOTE — Progress Notes (Signed)
Subjective:    Patient ID: Dwayne [name], male    DOB: 05-03-1946, 69 y.o.   MRN: [id number]  HPI Consult for management of COPD.  Mr. Elmore is a 69 year old active smoker with a diagnosis of COPD. He was referred by his primary care and cardiologist for further management. He has smoked a pack a day since the age of 86. He still continues to smoke and is trying to quit soon. He reports occasional dyspnea on exertion with wheezing. No cough, sputum production, fevers, chills, hemoptysis. He was given Spiriva by his cardiologist last month. He tried this about 4 times but stopped because he had dizziness and tightness in the chest after using it. He is also on albuterol when necessary but is not using it often.   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   Past Medical History  Diagnosis Date  . Palpitations   . Hypertension, benign     Benign  . Hyperlipidemia   . Esophageal reflux   . Depression   . Anxiety   . Compression fracture of L2 (HCC) 03/08/2013  . Coronary artery disease   . Myocardial infarction (HCC) 1998 X 2    "probably a couple since 1998" (02/12/2014)  . COPD (chronic obstructive pulmonary disease) (HCC)   . Pneumonia ~ 2004 X 1  . IDDM (insulin dependent diabetes mellitus) (HCC)   . Arthritis     "knees, elbows, hands" (02/12/2014)     Current outpatient prescriptions:  .  albuterol (PROVENTIL HFA;VENTOLIN HFA) 108 (90 BASE) MCG/ACT inhaler, Inhale 2 puffs into the lungs every 6 (six) hours as needed for wheezing., Disp: , Rfl:  .  aspirin 81 MG chewable tablet, Chew 1 tablet (81 mg total) by mouth daily., Disp: , Rfl:  .  atorvastatin (LIPITOR) 80 MG tablet, Take 80 mg by mouth daily., Disp: , Rfl:  .  clonazePAM (KLONOPIN) 0.5 MG tablet, Take 0.25-0.5 mg by mouth 2 (two) times daily as needed for anxiety. , Disp: , Rfl:  .  clopidogrel (PLAVIX) 75 MG  tablet, Take 1 tablet (75 mg total) by mouth daily., Disp: 90 tablet, Rfl: 3 .  glipiZIDE (GLUCOTROL) 10 MG tablet, Take 10 mg by mouth 2 (two) times daily before a meal., Disp: , Rfl:  .  hydrochlorothiazide (HYDRODIURIL) 25 MG tablet, Take 25 mg by mouth daily., Disp: , Rfl:  .  isosorbide mononitrate (IMDUR) 30 MG 24 hr tablet, Take 1 tablet (30 mg total) by mouth daily., Disp: 90 tablet, Rfl: 3 .  lisinopril (PRINIVIL,ZESTRIL) 40 MG tablet, Take 40 mg by mouth daily., Disp: , Rfl:  .  metFORMIN (GLUCOPHAGE) 1000 MG tablet, Take 1 tablet (1,000 mg total) by mouth 2 (two) times daily with a meal., Disp: , Rfl:  .  metoprolol succinate (TOPROL-XL) 25 MG 24 hr tablet, Take 1 tablet (25 mg total) by mouth daily., Disp: 90 tablet, Rfl: 1 .  nitroGLYCERIN (NITROSTAT) 0.4 MG SL tablet, Place 1 tablet (0.4 mg total) under the tongue every 5 (five) minutes as needed for chest pain., Disp: 25 tablet, Rfl: 6 .  pantoprazole (PROTONIX) 40 MG tablet, Take 1 tablet (40 mg total) by mouth daily., Disp: 30 tablet, Rfl: 0 .  sertraline (ZOLOFT) 100 MG tablet, Take 150 mg by mouth daily. , Disp: , Rfl:  .  tiotropium (SPIRIVA HANDIHALER) 18 MCG inhalation capsule,  Place 1 capsule (18 mcg total) into inhaler and inhale daily. (Patient not taking: Reported on 02/11/2015), Disp: 30 capsule, Rfl: 12  Review of Systems Denies any chest pain, cough, sputum production, palpitations. Has dyspnea on exertion. No wheezing, fevers, chills. No nausea, vomiting, diarrhea. All other review of systems is negative  Data: Echo (03/08/13) No prior study available for comparison. Mild LVH with   LVEF 60-65%, overall normal diastolic function. Upper   normal left and right atrial chamber size. Mildly   calcified aortic valve. Trivial mitral regurgitation.   Unable to assess PASP. No pericardial effusion.  CT scan chest (05/27/14) 1. Stable 4 mm right lower lobe and lingular nodules. These are likely benign and no further  followup necessary. This recommendation follows the consensus statement: Guidelines for Management of Small Pulmonary Nodules Detected on CT Scans: A Statement from the Fleischner Society as published in Radiology 2005; 237:395-400. 2. Diffuse bronchial wall thickening with emphysema, as above; imaging findings suggestive of underlying COPD. 3. Atherosclerotic disease including multi vessel coronary artery calcification.  Blood pressure 128/68, pulse 68, height  (1.753 m), weight 282 lb (127.914 kg), SpO2 95 %.    Objective:   Physical Exam Gen.: Awake, alert, oriented, no apparent distress. HEENT: PERRLA, EOMI Neck: Supple, no JVD or thyromegaly. CVS: S1 and S2 heard, no MRG RS: Clear to auscultation. Abdomen: Soft, positive BS. Extremities: No edema.    Assessment & Plan:   #1 COPD He is currently just on albuterol inhaler which he rarely uses. He did not tolerate a trial of Spiriva. He has dyspnea on exertion but he says that it does not bother him much. He does not want to try another inhaler. I will get a set of lung function tests. Reassess at next visit. If his symptoms are bothersome at that point I will start an alternate inhaler.  #2 Smoking. Patient continues to smoke. He he is planning to quit within the next 2 weeks. He is planning to use nicotine patches. He does not want any referral for a smoking cessation program.  #3 Lung nodules. Stable 4 mm nodules. He continues to smoke and need annual low-dose screening CT of the chest.  Plan: - Pulmonary function tests - Referral for screening CT of chest. - Smoking cessation.  Chilton Greathouse MD [facility] Pager [phone] If no answer or after 3pm call: [phone] 02/11/2015, 2:10 PM

## 2015-02-19 ENCOUNTER — Other Ambulatory Visit: Payer: Self-pay | Admitting: Cardiology

## 2015-02-20 NOTE — Telephone Encounter (Signed)
Pt should be taking Toprol XL 25 mg po daily per telephone encounter with the pt on 08/25/14 with Dr Delton SeeNelson.  Please refill accordingly.

## 2015-02-24 ENCOUNTER — Other Ambulatory Visit: Payer: Self-pay | Admitting: Acute Care

## 2015-02-24 DIAGNOSIS — F17210 Nicotine dependence, cigarettes, uncomplicated: Secondary | ICD-10-CM

## 2015-02-26 ENCOUNTER — Encounter: Payer: Self-pay | Admitting: Acute Care

## 2015-03-12 ENCOUNTER — Ambulatory Visit (INDEPENDENT_AMBULATORY_CARE_PROVIDER_SITE_OTHER): Payer: Medicare Other | Admitting: Pulmonary Disease

## 2015-03-12 DIAGNOSIS — J441 Chronic obstructive pulmonary disease with (acute) exacerbation: Secondary | ICD-10-CM

## 2015-03-12 LAB — PULMONARY FUNCTION TEST
DL/VA % PRED: 73 %
DL/VA: 3.41 ml/min/mmHg/L
DLCO unc % pred: 59 %
DLCO unc: 19.23 ml/min/mmHg
FEF 25-75 Post: 1.71 L/sec
FEF 25-75 Pre: 1.24 L/sec
FEF2575-%Change-Post: 37 %
FEF2575-%PRED-POST: 68 %
FEF2575-%PRED-PRE: 49 %
FEV1-%CHANGE-POST: 9 %
FEV1-%Pred-Post: 73 %
FEV1-%Pred-Pre: 67 %
FEV1-Post: 2.42 L
FEV1-Pre: 2.22 L
FEV1FVC-%Change-Post: -4 %
FEV1FVC-%PRED-PRE: 87 %
FEV6-%Change-Post: 15 %
FEV6-%Pred-Post: 91 %
FEV6-%Pred-Pre: 79 %
FEV6-Post: 3.85 L
FEV6-Pre: 3.33 L
FEV6FVC-%Change-Post: 0 %
FEV6FVC-%PRED-POST: 104 %
FEV6FVC-%Pred-Pre: 103 %
FVC-%Change-Post: 14 %
FVC-%PRED-PRE: 76 %
FVC-%Pred-Post: 87 %
FVC-POST: 3.91 L
FVC-PRE: 3.41 L
Post FEV1/FVC ratio: 62 %
Post FEV6/FVC ratio: 98 %
Pre FEV1/FVC ratio: 65 %
Pre FEV6/FVC Ratio: 98 %
RV % pred: 120 %
RV: 2.94 L
TLC % PRED: 91 %
TLC: 6.45 L

## 2015-03-12 NOTE — Progress Notes (Signed)
PFT done today. 

## 2015-03-18 ENCOUNTER — Telehealth: Payer: Self-pay | Admitting: Pulmonary Disease

## 2015-03-18 NOTE — Telephone Encounter (Signed)
Left message to call back. Patient calling to get PFT results, results in epic. Dr. Isaiah SergeMannam, please advise on results of PFT

## 2015-03-19 ENCOUNTER — Encounter: Payer: Self-pay | Admitting: Physical Therapy

## 2015-03-19 ENCOUNTER — Ambulatory Visit: Payer: Medicare Other | Attending: Family Medicine | Admitting: Physical Therapy

## 2015-03-19 DIAGNOSIS — R29898 Other symptoms and signs involving the musculoskeletal system: Secondary | ICD-10-CM | POA: Diagnosis present

## 2015-03-19 DIAGNOSIS — R269 Unspecified abnormalities of gait and mobility: Secondary | ICD-10-CM | POA: Insufficient documentation

## 2015-03-19 NOTE — Telephone Encounter (Signed)
Please let him know that the PFTs shows that he has moderate emphysema.

## 2015-03-19 NOTE — Patient Instructions (Signed)

## 2015-03-19 NOTE — Therapy (Signed)
[facility] [address], [address] [place], [place], [address] Phone: [phone]   Fax:  [phone]  Physical Therapy Evaluation  Patient Details  Name: Dwayne [name] MRN: [id number] Date of Birth: 02/21/1946 Referring Provider: Dr. Hali Marry  Encounter Date: 03/19/2015      PT End of Session - 03/19/15 1531    Visit Number 1   Number of Visits 10  Medicare   Date for PT Re-Evaluation 04/30/15   PT Start Time 1445   PT Stop Time 1528   PT Time Calculation (min) 43 min   Activity Tolerance Patient tolerated treatment well   Behavior During Therapy [facility] for tasks assessed/performed      Past Medical History  Diagnosis Date  . Palpitations   . Hypertension, benign     Benign  . Hyperlipidemia   . Esophageal reflux   . Depression   . Anxiety   . Compression fracture of L2 (HCC) 03/08/2013  . Coronary artery disease   . Myocardial infarction (HCC) 1998 X 2    "probably a couple since 1998" (02/12/2014)  . COPD (chronic obstructive pulmonary disease) (HCC)   . Pneumonia ~ 2004 X 1  . IDDM (insulin dependent diabetes mellitus) (HCC)   . Arthritis     "knees, elbows, hands" (02/12/2014)    Past Surgical History  Procedure Laterality Date  . Coronary angioplasty with stent placement  1999; 2014; 02/12/2014    "1; 2; 1"  . Coronary angioplasty  1998  . Cardiac catheterization  ~ 2012  . Cataract extraction w/ intraocular lens implant Right 10/2013  . Left heart catheterization with coronary angiogram N/A 02/12/2014    Procedure: LEFT HEART CATHETERIZATION WITH CORONARY ANGIOGRAM;  Surgeon: Lesleigh Noe, MD;  Location: [facility] CATH LAB;  Service: Cardiovascular;  Laterality: N/A;    There were no vitals filed for this visit.  Visit Diagnosis:  Abnormality of gait - Plan: PT plan of care cert/re-cert  Weakness of both lower extremities - Plan: PT plan of care cert/re-cert      Subjective Assessment - 03/19/15  1453    Subjective Patient reports he feels unsteady for the past 6 months.  Patient can walk if her holds onto a cart.  Patient cannot walk on uneven ground due to feet feel numb due to diabetes.    Patient Stated Goals stand up without falling down.  Be able to walk to lose weight.    Currently in Pain? No/denies            [facility] PT Assessment - 03/19/15 0001    Assessment   Medical Diagnosis R26.9 gait disturbance   Referring Provider Dr. Hali Marry   Onset Date/Surgical Date 09/07/14   Prior Therapy None   Precautions   Precautions Fall   Precaution Comments risk of falls   Balance Screen   Has the patient fallen in the past 6 months No   Has the patient had a decrease in activity level because of a fear of falling?  Yes   Is the patient reluctant to leave their home because of a fear of falling?  No   Home Environment   Living Environment Private residence   Living Arrangements Spouse/significant other   Type of Home Apartment   Home Access Level entry   Home Layout One level   Home Equipment Cassandra - single point;Shower seat   Prior Function   Level of Independence Independent   Vocation Retired  Cognition   Overall Cognitive Status Within Functional Limits for tasks assessed   Observation/Other Assessments   Focus on Therapeutic Outcomes (FOTO)  timed up and go test 26 sec 60% limitation  goal 30% limitation timed up and go 18 sec   Posture/Postural Control   Posture/Postural Control Postural limitations   Postural Limitations Forward head;Rounded Shoulders;Decreased lumbar lordosis   Strength   Right Hip Flexion 4+/5   Right Hip Extension 3+/5   Right Hip ABduction 3+/5   Left Hip Flexion 4+/5   Left Hip Extension 3+/5   Left Hip ABduction 3+/5   Right/Left Ankle --  bil 4/5   Flexibility   Soft Tissue Assessment /Muscle Length yes   Hamstrings tight bil.    Quadriceps tight bil.    Piriformis tight bil.    Transfers   Transfers Supine to Sit   Five  time sit to stand comments  46 sec, used hands   Supine to Sit With upper extremity assist   Ambulation/Gait   Ambulation/Gait Yes   Assistive device Straight cane   Gait Pattern Step-through pattern;Decreased hip/knee flexion - right;Decreased hip/knee flexion - left;Wide base of support;Poor foot clearance - left;Poor foot clearance - right  hips rotated outward   Gait Comments when patient stands up he has to get his balance going   Standardized Balance Assessment   Five times sit to stand comments  46   Timed Up and Go Test   TUG Normal TUG   Normal TUG (seconds) 27   TUG Comments used single point cane                   [facility] Adult PT Treatment/Exercise - 03/19/15 0001    Self-Care   Self-Care Other Self-Care Comments   Other Self-Care Comments  tips to avoid falls                PT Education - 03/19/15 1521    Education provided Yes   Education Details tips to avoid falls, walking   Person(s) Educated Patient   Methods Explanation;Handout   Comprehension Verbalized understanding          PT Short Term Goals - 03/19/15 1540    PT SHORT TERM GOAL #1   Title Independent with flexibility exercises   Time 3   Period Weeks   Status New   PT SHORT TERM GOAL #2   Title understand tips to avoid falls for safety   Time 3   Period Weeks   Status New   PT SHORT TERM GOAL #3   Title TUG with cane </= 20 sec   Time 3   Period Weeks   Status New   PT SHORT TERM GOAL #4   Title sit to stand test </= 38 seconds   Time 3   Period Weeks   Status New           PT Long Term Goals - 03/19/15 1542    PT LONG TERM GOAL #1   Title Independent with HEP and how to progress himself   Time 6   Period Weeks   Status New   PT LONG TERM GOAL #2   Title Timed up and go test </= 15 sec with cane   Time 6   Period Weeks   Status New   PT LONG TERM GOAL #3   Title Sit to stand test </= 30 sec   Time 6   Period Weeks   Status New  PT LONG TERM GOAL #4    Title improved confidence with not falling >/= 50% due to increased strength and balance   Time 6   Period Weeks   Status New               Plan - 03/31/15 1532    Clinical Impression Statement Patient is a 69 year old male with diagnosis of gait disturbance.  Patient reports no pain.  Patient reports no falls in the past 6 months but has a fear of falling.  Patient is unsteady on uneven surfaces and carpet due to diabetic neuropathy.  Patient ambulates with a single point cane, small steps, hips rotated outward, and slowly.  Patient has to use her arms to get up and down from a chair due to LE weakness. Sit to stand in 46 seconds indicating risk of falls.  Timed up and go test with single point cane was 26 seconds indicating a risk of falls. Hip strength right/left/5: flexion 4+/4+/5, abduction 3+/3+/5, and extension 3+/3+/5. Patient stands with forward head, rounded shoulders, and reduced lumbar lordosis. Patient would benefit from physical therapy to improve strength, improve endurance, improve balance and get patient on an exercise program.    Pt will benefit from skilled therapeutic intervention in order to improve on the following deficits Abnormal gait;Difficulty walking;Decreased endurance;Decreased activity tolerance;Impaired flexibility;Decreased balance;Decreased strength   Rehab Potential Good   Clinical Impairments Affecting Rehab Potential None   PT Frequency 2x / week   PT Duration 6 weeks   PT Treatment/Interventions Therapeutic exercise;Therapeutic activities;Stair training;Gait training;Balance training;Neuromuscular re-education;Patient/family education;Manual techniques   PT Next Visit Plan nustep, balance exercises, flexibility exercises   PT Home Exercise Plan flexbility exercises   Recommended Other Services patient has access to silver sneakers with health insurance   Consulted and Agree with Plan of Care Patient          G-Codes - Mar 31, 2015 1545     Functional Assessment Tool Used timed up and go score is 26 sec 60% limited  goal is 18 sec 30% limitatio   Functional Limitation Mobility: Walking and moving around   Mobility: Walking and Moving Around Current Status [phone]) At least 60 percent but less than 80 percent impaired, limited or restricted   Mobility: Walking and Moving Around Goal Status [phone]) At least 20 percent but less than 40 percent impaired, limited or restricted       Problem List Patient Active Problem List   Diagnosis Date Noted  . Diabetic neuropathy (HCC) 07/30/2014  . Adjustment disorder with depressed mood 02/24/2014  . Acid reflux 02/24/2014  . Diabetes mellitus, type 2 (HCC) 02/24/2014  . 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 Abnormal nuclear stress test 02/12/2014  . Hx of class III angina pectoris 02/12/2014  . Angina pectoris, crescendo (HCC) 02/12/2014  . History of class III angina pectoris 02/12/2014  . Lumbar vertebral fracture (HCC) 03/07/2013  . Angioedema 03/07/2013  . Depressive disorder, not elsewhere classified 02/25/2013  . CAD (coronary artery disease) of artery bypass graft 01/30/2013  . Essential hypertension 01/30/2013  . Hyperlipidemia 01/30/2013  . IDDM (insulin dependent diabetes mellitus) (HCC) 01/30/2013  . Medication refill 01/30/2013  . Chest pain 08/03/2012  . Arteriosclerosis of coronary artery 04/26/2012  . Accelerating angina (HCC) 04/25/2012  . Other reasons for seeking consultation 04/01/2011  . ACS (acute coronary syndrome) (HCC) 03/28/2011  . Anxiety 07/27/2010  . Healed myocardial infarct  06/29/2010  . Presence of stent in coronary artery 06/29/2010  . Advance directive discussed with patient 06/04/2010  . Benign essential HTN 01/17/2006  . Blood in the urine 01/17/2006  . HLD (hyperlipidemia) 01/17/2006  . Awareness of heartbeats 01/17/2006  . Cardiovascular degeneration (with mention of  arteriosclerosis) 01/17/2006    GRAY,CHERYL,PT 03/19/2015, 3:51 PM  [facility] [address], [address] [place], [place], [address] Phone: [phone]   Fax:  [phone]  Name: Olena MaterDennis Aguila MRN: [id number] Date of Birth: 08/03/1945

## 2015-03-19 NOTE — Telephone Encounter (Signed)
I spoke with patient about results and he verbalized understanding and had no questions. Pt is wanting to know if he needs to be on a daily inhaler. He tried spiriva in the past and did not do well for him. Please advise thanks

## 2015-03-20 NOTE — Telephone Encounter (Signed)
Dr Isaiah SergeMannam, please advise if pt needs daily inhaler due to PFT findings

## 2015-03-23 MED ORDER — BUDESONIDE-FORMOTEROL FUMARATE 80-4.5 MCG/ACT IN AERO: 2.0000 | INHALATION_SPRAY | Freq: Two times a day (BID) | RESPIRATORY_TRACT | Status: DC

## 2015-03-23 NOTE — Telephone Encounter (Signed)
Pt is aware of PM's recommendation. Rx has been sent in. Nothing further was needed. 

## 2015-03-23 NOTE — Telephone Encounter (Signed)
Prescribe symbicort 80/4.5

## 2015-04-01 ENCOUNTER — Encounter: Payer: Self-pay | Admitting: Physical Therapy

## 2015-04-07 ENCOUNTER — Ambulatory Visit: Payer: Medicare Other

## 2015-04-07 DIAGNOSIS — R269 Unspecified abnormalities of gait and mobility: Secondary | ICD-10-CM

## 2015-04-07 DIAGNOSIS — R29898 Other symptoms and signs involving the musculoskeletal system: Secondary | ICD-10-CM

## 2015-04-07 NOTE — Patient Instructions (Signed)
KNEE: Extension, Long Arc Quad (Weight)  Place weight around leg. Raise leg until knee is straight. Hold _5__ seconds. Use ___ lb weight. _10__ reps per set (each leg), 4-5__ sets per day, __7_ days per week  Knee Raise   Lift knee and then lower it. Repeat with other knee. Repeat _10__ times each leg. Do _4-5___ sessions per day.  http://gt2.exer.us/445   Copyright  VHI. All rights reserved.  Toe Up   Gently rise up on toes and back on heels. Repeat _20___ times. Do 4-5____ sessions per day.  HIP: Hamstrings - Short Sitting    Rest leg on raised surface. Keep knee straight. Lift chest. Hold _20__ seconds. _3__ reps per set, _3__ sets per day  Copyright  VHI. All rights reserved.    [facility] [address], [address] [place], [place] [address] Phone # [phone] Fax [phone]   

## 2015-04-07 NOTE — Therapy (Signed)
[facility] [address] [place], [place], [address] Phone: [phone]   Fax:  [phone]  Physical Therapy Treatment  Patient Details  Name: Dwayne [name] MRN: [id number] Date of Birth: [birth year] Referring Provider: Dr. Hali Marry  Encounter Date: 04/07/2015      PT End of Session - 04/07/15 1433    Visit Number 2   Number of Visits 10   Date for PT Re-Evaluation 04/30/15   PT Start Time 1358   PT Stop Time 1430   PT Time Calculation (min) 32 min   Activity Tolerance Other (comment);Patient tolerated treatment well  pt was warm and tired after workout   Behavior During Therapy [facility] for tasks assessed/performed      Past Medical History  Diagnosis Date  . Palpitations   . Hypertension, benign     Benign  . Hyperlipidemia   . Esophageal reflux   . Depression   . Anxiety   . Compression fracture of L2 (HCC) 03/08/2013  . Coronary artery disease   . Myocardial infarction (HCC) 1998 X 2    "probably a couple since 1998" (02/12/2014)  . COPD (chronic obstructive pulmonary disease) (HCC)   . Pneumonia ~ 2004 X 1  . IDDM (insulin dependent diabetes mellitus) (HCC)   . Arthritis     "knees, elbows, hands" (02/12/2014)    Past Surgical History  Procedure Laterality Date  . Coronary angioplasty with stent placement  1999; 2014; 02/12/2014    "1; 2; 1"  . Coronary angioplasty  1998  . Cardiac catheterization  ~ 2012  . Cataract extraction w/ intraocular lens implant Right 10/2013  . Left heart catheterization with coronary angiogram N/A 02/12/2014    Procedure: LEFT HEART CATHETERIZATION WITH CORONARY ANGIOGRAM;  Surgeon: Lesleigh Noe, MD;  Location: [facility] CATH LAB;  Service: Cardiovascular;  Laterality: N/A;    There were no vitals filed for this visit.  Visit Diagnosis:  Abnormality of gait  Weakness of both lower extremities      Subjective Assessment - 04/07/15 1359    Subjective No change  since evaluation.  Pt has received education regarding fall prevention.     Currently in Pain? No/denies                         [facility] Adult PT Treatment/Exercise - 04/07/15 0001    Exercises   Exercises Knee/Hip;Ankle   Knee/Hip Exercises: Stretches   Active Hamstring Stretch 3 reps;20 seconds   Piriformis Stretch 3 reps;20 seconds;Both  seated figure 4   Knee/Hip Exercises: Aerobic   Nustep Level 1 x 8 minutes  seat 11, arms 10   Knee/Hip Exercises: Standing   Hip Abduction Stengthening;Both;2 sets;10 reps   Hip Extension Stengthening;Both;2 sets;10 reps   Knee/Hip Exercises: Seated   Long Arc Quad Strengthening;Both;2 sets;10 reps   Marching Strengthening;Both;2 sets;10 reps   Ankle Exercises: Seated   Heel Raises 20 reps   Toe Raise 20 reps                PT Education - 04/07/15 1414    Education provided Yes   Education Details HEP: seated hamstring stretch, long arc quads, marching, and heel/toe raises   Person(s) Educated Patient;Spouse   Methods Explanation;Demonstration;Handout   Comprehension Verbalized understanding;Returned demonstration          PT Short Term Goals - 04/07/15 1403    PT SHORT TERM GOAL #1   Title Independent  with flexibility exercises   Time 3   Status On-going   PT SHORT TERM GOAL #2   Title understand tips to avoid falls for safety   Status Achieved   PT SHORT TERM GOAL #3   Title TUG with cane </= 20 sec   Time 3   Period Weeks   Status On-going           PT Long Term Goals - 03/19/15 1542    PT LONG TERM GOAL #1   Title Independent with HEP and how to progress himself   Time 6   Period Weeks   Status New   PT LONG TERM GOAL #2   Title Timed up and go test </= 15 sec with cane   Time 6   Period Weeks   Status New   PT LONG TERM GOAL #3   Title Sit to stand test </= 30 sec   Time 6   Period Weeks   Status New   PT LONG TERM GOAL #4   Title improved confidence with not falling >/= 50% due  to increased strength and balance   Time 6   Period Weeks   Status New               Plan - 04/07/15 1403    Clinical Impression Statement Pt with only 1 session after evaluation.  PT established HEP today for LE strength.  PT is unsteady on unlevel surfaces and uses UE support for sit to stand transition.  Pt will continue to benefit from skilled PT for strength, balance and endurance progression   Pt will benefit from skilled therapeutic intervention in order to improve on the following deficits Abnormal gait;Difficulty walking;Decreased endurance;Decreased activity tolerance;Impaired flexibility;Decreased balance;Decreased strength   Rehab Potential Good   PT Frequency 2x / week   PT Duration 6 weeks   PT Treatment/Interventions Therapeutic exercise;Therapeutic activities;Stair training;Gait training;Balance training;Neuromuscular re-education;Patient/family education;Manual techniques   PT Next Visit Plan nustep, balance exercises, flexibility exercises   Consulted and Agree with Plan of Care Patient        Problem List Patient Active Problem List   Diagnosis Date Noted  . Diabetic neuropathy (HCC) 07/30/2014  . Adjustment disorder with depressed mood 02/24/2014  . Acid reflux 02/24/2014  . Diabetes mellitus, type 2 (HCC) 02/24/2014  . 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 Abnormal nuclear stress test 02/12/2014  . Hx of class III angina pectoris 02/12/2014  . Angina pectoris, crescendo (HCC) 02/12/2014  . History of class III angina pectoris 02/12/2014  . Lumbar vertebral fracture (HCC) 03/07/2013  . Angioedema 03/07/2013  . Depressive disorder, not elsewhere classified 02/25/2013  . CAD (coronary artery disease) of artery bypass graft 01/30/2013  . Essential hypertension 01/30/2013  . Hyperlipidemia 01/30/2013  . IDDM (insulin dependent diabetes mellitus) (HCC) 01/30/2013  . Medication refill  01/30/2013  . Chest pain 08/03/2012  . Arteriosclerosis of coronary artery 04/26/2012  . Accelerating angina (HCC) 04/25/2012  . Other reasons for seeking consultation 04/01/2011  . ACS (acute coronary syndrome) (HCC) 03/28/2011  . Anxiety 07/27/2010  . Healed myocardial infarct 06/29/2010  . Presence of stent in coronary artery 06/29/2010  . Advance directive discussed with patient 06/04/2010  . Benign essential HTN 01/17/2006  . Blood in the urine 01/17/2006  . HLD (hyperlipidemia) 01/17/2006  . Awareness of heartbeats 01/17/2006  . Cardiovascular degeneration (with mention of arteriosclerosis) 01/17/2006  Jeremi Losito, PT11/29/2016, 2:34 PM  [facility] [address] [place], [place], [address] Phone: [phone]   Fax:  [phone]  Name: Vandy Tsuchiya MRN: [id number] Date of Birth: 16-May-1945

## 2015-04-13 ENCOUNTER — Encounter: Payer: Self-pay | Admitting: Physical Therapy

## 2015-04-15 ENCOUNTER — Encounter: Payer: Self-pay | Admitting: Physical Therapy

## 2015-04-15 ENCOUNTER — Ambulatory Visit: Payer: Medicare Other | Attending: Family Medicine | Admitting: Physical Therapy

## 2015-04-15 DIAGNOSIS — R269 Unspecified abnormalities of gait and mobility: Secondary | ICD-10-CM

## 2015-04-15 DIAGNOSIS — R29898 Other symptoms and signs involving the musculoskeletal system: Secondary | ICD-10-CM

## 2015-04-15 NOTE — Therapy (Signed)
[facility] [address] [place], [place], [address] Phone: [phone]   Fax:  [phone]  Physical Therapy Treatment  Patient Details  Name: Dwayne [name] MRN: [id number] Date of Birth: 04/29/1946 Referring Provider: Dr. Hali MarryAarom Morrow  Encounter Date: 04/15/2015      PT End of Session - 04/15/15 1508    Visit Number 3   Number of Visits 10   Date for PT Re-Evaluation 04/30/15   PT Start Time 1446   PT Stop Time 1529   PT Time Calculation (min) 43 min   Activity Tolerance Other (comment);Patient tolerated treatment well   Behavior [facility] for tasks assessed/performed      Past Medical History  Diagnosis Date  . Palpitations   . Hypertension, benign     Benign  . Hyperlipidemia   . Esophageal reflux   . Depression   . Anxiety   . Compression fracture of L2 (HCC) 03/08/2013  . Coronary artery disease   . Myocardial infarction (HCC) 1998 X 2    "probably a couple since 1998" (02/12/2014)  . COPD (chronic obstructive pulmonary disease) (HCC)   . Pneumonia ~ 2004 X 1  . IDDM (insulin dependent diabetes mellitus) (HCC)   . Arthritis     "knees, elbows, hands" (02/12/2014)    Past Surgical History  Procedure Laterality Date  . Coronary angioplasty with stent placement  1999; 2014; 02/12/2014    "1; 2; 1"  . Coronary angioplasty  1998  . Cardiac catheterization  ~ 2012  . Cataract extraction w/ intraocular lens implant Right 10/2013  . Left heart catheterization with coronary angiogram N/A 02/12/2014    Procedure: LEFT HEART CATHETERIZATION WITH CORONARY ANGIOGRAM;  Surgeon: Lesleigh NoeHenry W Smith III, MD;  [facility];  Service: Cardiovascular;  Laterality: N/A;    There were no vitals filed for this visit.  Visit Diagnosis:  Abnormality of gait  Weakness of both lower extremities      Subjective Assessment - 04/15/15 1500    Subjective Pt feels well, no major changes reported   Patient  Stated Goals stand up without falling down.  Be able to walk to lose weight.    Currently in Pain? No/denies                         [facility] - 04/15/15 0001    Exercises   Exercises Knee/Hip;Ankle   Knee/Hip Exercises: Stretches   Active Hamstring Stretch 3 reps;20 seconds  each leg, with v/c's for technique   Piriformis Stretch 3 reps;20 seconds;Both  seated figure 4   Knee/Hip Exercises: Aerobic   Nustep Level 1 x 8 minutes  seat#11, arms #10   Knee/Hip Exercises: Standing   Hip Abduction Stengthening;Both;2 sets;10 reps  3# added    Hip Extension Stengthening;Both;2 sets;10 reps  3# added   Knee/Hip Exercises: Seated   Long Arc Quad Strengthening;Both;2 sets;10 reps  3# added   Marching Strengthening;Both;2 sets;10 reps  3# added   Ankle Exercises: Seated   Heel Raises 20 reps  on stairs   Toe Raise 20 reps  on stairs                  PT Short Term Goals - 04/15/15 1514    PT SHORT TERM GOAL #1   Title Independent with flexibility exercises   Time 3   Period Weeks   Status On-going   PT SHORT  TERM GOAL #2   Title understand tips to avoid falls for safety   Time 3   Period Weeks   Status Achieved   PT SHORT TERM GOAL #3   Title TUG with cane </= 20 sec   Time 3   Period Weeks   Status On-going   PT SHORT TERM GOAL #4   Title sit to stand test </= 38 seconds   Time 3   Period Weeks   Status On-going           PT Long Term Goals - 04/15/15 1515    PT LONG TERM GOAL #1   Title Independent with HEP and how to progress himself   Time 6   Period Weeks   Status On-going   PT LONG TERM GOAL #2   Title Timed up and go test </= 15 sec with cane   Time 6   Period Weeks   Status On-going   PT LONG TERM GOAL #3   Title Sit to stand test </= 30 sec   Time 6   Period Weeks   Status On-going   PT LONG TERM GOAL #4   Title improved confidence with not falling >/= 50% due to increased strength and  balance   Time 6   Period Weeks   Status On-going               Plan - 04/15/15 1509    Clinical Impression Statement Pt has good flexibility with bil hips, bil knees are limited due to "bad" knees. Pt with diabetic neuropathy and has sometimes the feeling walking on a sponge. Pt will continue to benefit from skilled PT for strenght, balance and endurance progression.    Pt will benefit from skilled therapeutic intervention in order to improve on the following deficits Abnormal gait;Difficulty walking;Decreased endurance;Decreased activity tolerance;Impaired flexibility;Decreased balance;Decreased strength   Rehab Potential Good   PT Frequency 2x / week   PT Duration 6 weeks   PT Treatment/Interventions Therapeutic exercise;Therapeutic activities;Stair training;Gait training;Balance training;Neuromuscular re-education;Patient/family education;Manual techniques   PT Next Visit Plan nustep, balance exercises, flexibility exercises   PT Home Exercise Plan flexbility exercises   Consulted and Agree with Plan of Care Patient        Problem List Patient Active Problem List   Diagnosis Date Noted  . Diabetic neuropathy (HCC) 07/30/2014  . Adjustment disorder with depressed mood 02/24/2014  . Acid reflux 02/24/2014  . Diabetes mellitus, type 2 (HCC) 02/24/2014  . 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 Abnormal nuclear stress test 02/12/2014  . Hx of class III angina pectoris 02/12/2014  . Angina pectoris, crescendo (HCC) 02/12/2014  . History of class III angina pectoris 02/12/2014  . Lumbar vertebral fracture (HCC) 03/07/2013  . Angioedema 03/07/2013  . Depressive disorder, not elsewhere classified 02/25/2013  . CAD (coronary artery disease) of artery bypass graft 01/30/2013  . Essential hypertension 01/30/2013  . Hyperlipidemia 01/30/2013  . IDDM (insulin dependent diabetes mellitus) (HCC) 01/30/2013  .  Medication refill 01/30/2013  . Chest pain 08/03/2012  . Arteriosclerosis of coronary artery 04/26/2012  . Accelerating angina (HCC) 04/25/2012  . Other reasons for seeking consultation 04/01/2011  . ACS (acute coronary syndrome) (HCC) 03/28/2011  . Anxiety 07/27/2010  . Healed myocardial infarct 06/29/2010  . Presence of stent in coronary artery 06/29/2010  . Advance directive discussed with patient 06/04/2010  . Benign essential HTN 01/17/2006  . Blood  in the urine 01/17/2006  . HLD (hyperlipidemia) 01/17/2006  . Awareness of heartbeats 01/17/2006  . Cardiovascular degeneration (with mention of arteriosclerosis) 01/17/2006    NAUMANN-HOUEGNIFIO,Melaney Tellefsen PTA 04/15/2015, 3:21 PM  [facility] [address], [address] [place], [place], [address] Phone: [phone]   Fax:  [phone]  Name: Dwayne [name] MRN: [id number] Date of Birth: 1945-09-03

## 2015-04-20 ENCOUNTER — Encounter: Payer: Self-pay | Admitting: Physical Therapy

## 2015-04-20 ENCOUNTER — Ambulatory Visit: Payer: Medicare Other | Admitting: Physical Therapy

## 2015-04-20 DIAGNOSIS — R269 Unspecified abnormalities of gait and mobility: Secondary | ICD-10-CM

## 2015-04-20 DIAGNOSIS — R29898 Other symptoms and signs involving the musculoskeletal system: Secondary | ICD-10-CM

## 2015-04-20 NOTE — Therapy (Addendum)
[facility] [address] [place], [place], [address] Phone: [phone]   Fax:  [phone]  Physical Therapy Treatment  Patient Details  Name: Dwayne [name] MRN: [id number] Date of Birth: 12-18-1945 Referring Provider: Dr. Karena Addison  Encounter Date: 04/20/2015      PT End of Session - 04/20/15 1458    Visit Number 4   Number of Visits 10   Date for PT Re-Evaluation 04/30/15   PT Start Time 1455  Pt late and fatigued today/low tolerance   PT Stop Time 1525   PT Time Calculation (min) 30 min   Activity Tolerance Patient limited by fatigue   Behavior During Therapy [facility] for tasks assessed/performed      Past Medical History  Diagnosis Date  . Palpitations   . Hypertension, benign     Benign  . Hyperlipidemia   . Esophageal reflux   . Depression   . Anxiety   . Compression fracture of L2 ([place]) 03/08/2013  . Coronary artery disease   . Myocardial infarction ([place]) 1998 X 2    "probably a couple since 1998" (02/12/2014)  . COPD (chronic obstructive pulmonary disease) ([place])   . Pneumonia ~ 2004 X 1  . IDDM (insulin dependent diabetes mellitus) ([place])   . Arthritis     "knees, elbows, hands" (02/12/2014)    Past Surgical History  Procedure Laterality Date  . Coronary angioplasty with stent placement  1999; 2014; 02/12/2014    "1; 2; 1"  . Coronary angioplasty  1998  . Cardiac catheterization  ~ 2012  . Cataract extraction w/ intraocular lens implant Right 10/2013  . Left heart catheterization with coronary angiogram N/A 02/12/2014    Procedure: LEFT HEART CATHETERIZATION WITH CORONARY ANGIOGRAM;  Surgeon: Sinclair Grooms, MD;  Location: [facility] CATH LAB;  Service: Cardiovascular;  Laterality: N/A;    There were no vitals filed for this visit.  Visit Diagnosis:  Abnormality of gait  Weakness of both lower extremities      Subjective Assessment - 04/20/15 1500    Subjective I'm walking better, better  balance. This therapy is working.    Currently in Pain? No/denies  Just tired                         [facility] Adult PT Treatment/Exercise - 04/20/15 0001    Knee/Hip Exercises: Stretches   Active Hamstring Stretch 3 reps;20 seconds  each leg, with v/c's for technique   Knee/Hip Exercises: Aerobic   Nustep L1 x 9 min   Knee/Hip Exercises: Standing   Heel Raises Both;1 set;10 reps   Hip Abduction Stengthening;Both;1 set;10 reps  3#, only 1 set, pt got dizzy, sat down down unitl ok   Hip Extension AAROM;Both;1 set;10 reps   Extension Limitations 3#, not feeling great in standing   Knee/Hip Exercises: Seated   Long Arc Quad AROM;Both;10 reps                  PT Short Term Goals - 04/20/15 1502    PT SHORT TERM GOAL #1   Title Independent with flexibility exercises   Time 3   Period Weeks   Status Partially Met  Doing my standing ones only   PT SHORT TERM GOAL #3   Title TUG with cane </= 20 sec   Time 3   Period Weeks   Status On-going   PT SHORT TERM GOAL #4   Title sit to  stand test </= 38 seconds   Time 3   Period Weeks   Status On-going           PT Long Term Goals - 04/15/15 1515    PT LONG TERM GOAL #1   Title Independent with HEP and how to progress himself   Time 6   Period Weeks   Status On-going   PT LONG TERM GOAL #2   Title Timed up and go test </= 15 sec with cane   Time 6   Period Weeks   Status On-going   PT LONG TERM GOAL #3   Title Sit to stand test </= 30 sec   Time 6   Period Weeks   Status On-going   PT LONG TERM GOAL #4   Title improved confidence with not falling >/= 50% due to increased strength and balance   Time 6   Period Weeks   Status On-going               Plan - 04/22/15 1520    Clinical Impression Statement Pt reports not feeling his best today. He was out a lot yesterday, switched inhalers today all making him feeling tired/no spunk today. He does report his walking is better,feels more  steady.    Pt will benefit from skilled therapeutic intervention in order to improve on the following deficits Abnormal gait;Difficulty walking;Decreased endurance;Decreased activity tolerance;Impaired flexibility;Decreased balance;Decreased strength   Rehab Potential Good   Clinical Impairments Affecting Rehab Potential None   PT Frequency 2x / week   PT Duration 6 weeks   PT Treatment/Interventions Therapeutic exercise;Therapeutic activities;Stair training;Gait training;Balance training;Neuromuscular re-education;Patient/family education;Manual techniques   PT Next Visit Plan Nustep, progress more with balance if pt feels better.    Consulted and Agree with Plan of Care Patient        Problem List Patient Active Problem List   Diagnosis Date Noted  . Diabetic neuropathy ([place]) 07/30/2014  . Adjustment disorder with depressed mood 02/24/2014  . Acid reflux 02/24/2014  . Diabetes mellitus, type 2 ([place]) 02/24/2014  . 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 Abnormal nuclear stress test 02/12/2014  . Hx of class III angina pectoris 02/12/2014  . Angina pectoris, crescendo ([place]) 02/12/2014  . History of class III angina pectoris 02/12/2014  . Lumbar vertebral fracture ([place]) 03/07/2013  . Angioedema 03/07/2013  . Depressive disorder, not elsewhere classified 02/25/2013  . CAD (coronary artery disease) of artery bypass graft 01/30/2013  . Essential hypertension 01/30/2013  . Hyperlipidemia 01/30/2013  . IDDM (insulin dependent diabetes mellitus) ([place]) 01/30/2013  . Medication refill 01/30/2013  . Chest pain 08/03/2012  . Arteriosclerosis of coronary artery 04/26/2012  . Accelerating angina ([place]) 04/25/2012  . Other reasons for seeking consultation 04/01/2011  . ACS (acute coronary syndrome) ([place]) 03/28/2011  . Anxiety 07/27/2010  . Healed myocardial infarct 06/29/2010  . Presence of stent in coronary artery 06/29/2010  .  Advance directive discussed with patient 06/04/2010  . Benign essential HTN 01/17/2006  . Blood in the urine 01/17/2006  . HLD (hyperlipidemia) 01/17/2006  . Awareness of heartbeats 01/17/2006  . 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     Remaining deficits: See above for most current status.    Education / Equipment: HEP Plan: Patient agrees to discharge.  Patient goals were partially met. Patient is being discharged due to not returning since the last visit.  ?????   Sigurd Sos, [place] 07/06/2015 3:23 PM  [facility] [address] [place], [place], [address] Phone: [phone]   Fax:  [phone]  Name: Marshell Dilauro MRN: [id number] Date of Birth: May 05, 1946

## 2015-04-22 ENCOUNTER — Encounter: Payer: Self-pay | Admitting: Physical Therapy

## 2015-04-27 ENCOUNTER — Encounter: Payer: Self-pay | Admitting: Physical Therapy

## 2015-04-28 ENCOUNTER — Ambulatory Visit: Payer: Medicare Other

## 2015-04-28 ENCOUNTER — Telehealth: Payer: Self-pay

## 2015-04-28 NOTE — Telephone Encounter (Signed)
PT called pt due to missed appt today at 2:00.  He thought his appt was 04/29/15.  He confirmed appt for 3:30 04/30/15.  Re-evaluation will be needed.

## 2015-04-29 ENCOUNTER — Encounter: Payer: Self-pay | Admitting: Physical Therapy

## 2015-04-30 ENCOUNTER — Ambulatory Visit: Payer: Medicare Other | Admitting: Rehabilitation

## 2015-05-15 ENCOUNTER — Ambulatory Visit: Payer: Medicare Other | Admitting: Cardiology

## 2015-05-19 ENCOUNTER — Other Ambulatory Visit: Payer: Self-pay | Admitting: Cardiology

## 2015-05-25 ENCOUNTER — Ambulatory Visit
Admission: RE | Admit: 2015-05-25 | Discharge: 2015-05-25 | Disposition: A | Discharge status: Home / Self Care | Payer: Medicare Other | Source: Ambulatory Visit | Attending: Acute Care | Admitting: Acute Care

## 2015-05-25 ENCOUNTER — Encounter: Payer: Self-pay | Admitting: Acute Care

## 2015-05-25 ENCOUNTER — Ambulatory Visit (INDEPENDENT_AMBULATORY_CARE_PROVIDER_SITE_OTHER): Payer: Medicare Other | Admitting: Acute Care

## 2015-05-25 DIAGNOSIS — F17210 Nicotine dependence, cigarettes, uncomplicated: Secondary | ICD-10-CM

## 2015-05-25 NOTE — Progress Notes (Signed)
Shared Decision Making Visit Lung Cancer Screening Program [phone]([id number])   Eligibility:  Age 70 y.o.  Pack Years Smoking History Calculation 50 pack years (# packs/per year x # years smoked)  Recent History of coughing up blood  no  Unexplained weight loss? no ( >Than 15 pounds within the last 6 months )  Prior History Lung / other cancer no (Diagnosis within the last 5 years already requiring surveillance chest CT Scans).  Smoking Status Current Smoker  Former Smokers: Years since quit: NA  Quit Date: NA  Visit Components:  Discussion included one or more decision making aids. yes  Discussion included risk/benefits of screening. yes  Discussion included potential follow up diagnostic testing for abnormal scans. yes  Discussion included meaning and risk of over diagnosis. yes  Discussion included meaning and risk of False Positives. yes  Discussion included meaning of total radiation exposure. yes  Counseling Included:  Importance of adherence to annual lung cancer LDCT screening. yes  Impact of comorbidities on ability to participate in the program. yes  Ability and willingness to under diagnostic treatment. yes  Smoking Cessation Counseling:  Current Smokers:   Discussed importance of smoking cessation. yes  Information about tobacco cessation classes and interventions provided to patient. yes  Patient provided with "ticket" for LDCT Scan. yes  Symptomatic Patient. no  Counseling: NA  Diagnosis Code: Tobacco Use Z72.0  Asymptomatic Patient yes  Counseling (Intermediate counseling: > three minutes counseling) [id number]  Former Smokers:   Discussed the importance of maintaining cigarette abstinence. NA Current Smoker  Diagnosis Code: Personal History of Nicotine Dependence. N56.213Z87.891  Information about tobacco cessation classes and interventions provided to patient. Yes  Patient provided with "ticket" for LDCT Scan. yes  Written Order for Lung Cancer  Screening with LDCT placed in Epic. Yes (CT Chest Lung Cancer Screening Low Dose W/O CM) [id number] Z12.2-Screening of respiratory organs Z87.891-Personal history of nicotine dependence   I have spent 15 minutes of face to face time with Dwayne [name] discussing the risks and benefits of lung cancer screening. We viewed a power point together that explained in detail the above noted topics. We paused at intervals to allow for questions to be asked and answered to ensure understanding.We discussed that the single most powerful action that he can take to decrease his risk of developing lung cancer is to quit smoking. We discussed whether or not he is ready to commit to setting a quit date. He is not ready to set a quit date at this time. We discussed options for tools to aid in quitting smoking including nicotine replacement therapy, non-nicotine medications, support groups, Quit Smart classes, and behavior modification. We discussed that often times setting smaller, more achievable goals, such as eliminating 1 cigarette a day for a week and then 2 cigarettes a day for a week can be helpful in slowly decreasing the number of cigarettes smoked. This allows for a sense of accomplishment as well as providing a clinical benefit. I gave him the " Be Stronger Than Your Excuses" card with contact information for community resources, classes, free nicotine replacement therapy, and access to mobile apps, text messaging, and on-line smoking cessation help. I have also given him my card and contact information in the event he needs to contact me. We discussed the time and location of the scan, and that either Jerolyn Shinammy Wilson, CMA, or I will call with the results within 24-48 hours of receiving them. I have provided he with a copy of the power  point we viewed  as a resource in the event they need reinforcement of the concepts we discussed today in the office. The patient verbalized understanding of all of  the above and had no  further questions upon leaving the office. They have my contact information in the event they have any further questions.  [name], NP

## 2015-05-28 ENCOUNTER — Encounter: Payer: Self-pay | Admitting: Acute Care

## 2015-06-02 ENCOUNTER — Telehealth: Payer: Self-pay | Admitting: Acute Care

## 2015-06-02 NOTE — Telephone Encounter (Signed)
Dr. Isaiah Serge,  Mr. Ace LDCT was read as a Lung  RADS 3, nodules that are probably benign findings, short term follow up suggested: includes nodules with a low likelihood of becoming a clinically active cancer. Radiology recommends a 6 month repeat LDCT follow up. We will schedule him for the repeat scan in July. 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  Thanks you for the referral and let me know if you have any questions or concerns.  Sarah  Message sent 06/02/15.

## 2015-06-12 ENCOUNTER — Encounter: Payer: Self-pay | Admitting: Cardiology

## 2015-06-12 ENCOUNTER — Ambulatory Visit (INDEPENDENT_AMBULATORY_CARE_PROVIDER_SITE_OTHER): Payer: Medicare Other | Admitting: Cardiology

## 2015-06-12 VITALS — BP 124/64 | HR 66 | Ht 69.0 in | Wt 286.0 lb

## 2015-06-12 DIAGNOSIS — E785 Hyperlipidemia, unspecified: Secondary | ICD-10-CM

## 2015-06-12 DIAGNOSIS — I10 Essential (primary) hypertension: Secondary | ICD-10-CM | POA: Diagnosis not present

## 2015-06-12 DIAGNOSIS — I2510 Atherosclerotic heart disease of native coronary artery without angina pectoris: Secondary | ICD-10-CM | POA: Diagnosis not present

## 2015-06-12 DIAGNOSIS — I5033 Acute on chronic diastolic (congestive) heart failure: Secondary | ICD-10-CM

## 2015-06-12 DIAGNOSIS — I249 Acute ischemic heart disease, unspecified: Secondary | ICD-10-CM

## 2015-06-12 MED ORDER — POTASSIUM CHLORIDE ER 10 MEQ PO TBCR
10.0000 meq | EXTENDED_RELEASE_TABLET | Freq: Every day | ORAL | Status: DC
Start: 2015-06-12 — End: 2015-09-02

## 2015-06-12 MED ORDER — FUROSEMIDE 20 MG PO TABS: 20.0000 mg | ORAL_TABLET | Freq: Every day | ORAL | Status: DC

## 2015-06-12 NOTE — Patient Instructions (Signed)
Medication Instructions:   STOP TAKING HYDROCHLOROTHIAZIDE NOW  START TAKING LASIX 20 MG ONCE DAILY  START TAKING POTASSIUM CHLORIDE 10 mEq ONCE DAILY     Follow-Up:  3 MONTHS WITH DR Delton See    If you need a refill on your cardiac medications before your next appointment, please call your pharmacy.

## 2015-06-12 NOTE — Progress Notes (Signed)
Patient ID: Dwayne [name], male   DOB: 24-Oct-1945, 70 y.o.   MRN: [id number]    Patient Name: Dwayne [name] Date of Encounter: 06/12/2015  Primary Care Provider:  Farris Has, MD Primary Cardiologist:  Lars Masson  Chief complain: Fatigue  Problem List   Past Medical History  Diagnosis Date  . Palpitations   . Hypertension, benign     Benign  . Hyperlipidemia   . Esophageal reflux   . Depression   . Anxiety   . Compression fracture of L2 (HCC) 03/08/2013  . Coronary artery disease   . Myocardial infarction (HCC) 1998 X 2    "probably a couple since 1998" (02/12/2014)  . COPD (chronic obstructive pulmonary disease) (HCC)   . Pneumonia ~ 2004 X 1  . IDDM (insulin dependent diabetes mellitus) (HCC)   . Arthritis     "knees, elbows, hands" (02/12/2014)   Past Surgical History  Procedure Laterality Date  . Coronary angioplasty with stent placement  1999; 2014; 02/12/2014    "1; 2; 1"  . Coronary angioplasty  1998  . Cardiac catheterization  ~ 2012  . Cataract extraction w/ intraocular lens implant Right 10/2013  . Left heart catheterization with coronary angiogram N/A 02/12/2014    Procedure: LEFT HEART CATHETERIZATION WITH CORONARY ANGIOGRAM;  Surgeon: Lesleigh Noe, MD;  Location: [facility] CATH LAB;  Service: Cardiovascular;  Laterality: N/A;    Allergies  Allergies  Allergen Reactions  . Morphine And Related Swelling    Swelling of the tongue and face    HPI  70 year old male with h/o IDDM, obesity, hypertension, hyperlipidemia, ongoing smoking and CAD who just moved to [facility] and is here to establish cardiology care. 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 He was doing well until 2012 when he had another episode chest pain and received stent to RCA another the next day for in stent restenosis. He was sterted on Prasugrel after that. He describes his pain as reflux but stronger. He  states that since the last stent he has been asymptomatic. He is trying to exercise daily and has CP or SOB.    1 year followup - 12/17/2013 - the patient had a back injury in November 2014. He is otherwise doing well no chest pain, no shortness of breath, he walks mainly because of his back injury. He denies any lower extremity edema orthopnea medications. His only complaint is fatigue and sometimes low blood pressure.  03/28/2014 - the patient was seen in September for recurrent chest pains. He underwent nuclear stress test that was high risk stress test with moderate sized reversible inferior ischemia. He underwent cardiac catheterization on 08/13/2013 and was found to have severe in-stent restenosis in the proximal RCA stent. He received a stent and continued on dual antiplatelet therapy with aspirin and Effient. He is coming today refers that he is severely fatigued and short of breath. He states he didn't get relief he used to get when he got stents in the past. He also feels depressed and has hard time quitting smoking.  07/30/2014 - the patient is coming after 4 months, he denies any chest pain, he shortness of breath has improved, however he states that he has been feeling down, with no energy and fatigue. He stopped using narcotics for back pain that has resolved. He states that lately he hasn't been exercising at all and he  has been eating a lot of junk food that lead to 20 pound weight gain. He denies any palpitations or syncope. His bruising of his upper extremities has improved on Plavix when compared to Brilinta, he is compliant with his medicines. The patient is complaining of tingling and burning in all of his extremities.  06/12/2015 - 6 months follow up, the patient has had problems with wheezing and was started on Spiriva that keep him symptoms of chest tightness. He was switched to Symbicort by his pulmonary doctor with similar feelings but resolution of wheezing. He now doesn't feel  short of breath denies chest pain, orthopnea or proximal nocturnal dyspnea. He has noticed worsening lower extremity edema more in the left and right. He is compliant with his medications. He is also noticed dizziness but no syncope.   Home Medications  Prior to Admission medications   Medication Sig Start Date End Date Taking? Authorizing Provider  albuterol (PROVENTIL) (2.5 MG/3ML) 0.083% nebulizer solution Take 2.5 mg by nebulization every 6 (six) hours as needed for wheezing.   Yes Historical Provider, MD  atorvastatin (LIPITOR) 80 MG tablet Take 80 mg by mouth daily.   Yes Historical Provider, MD  clonazePAM (KLONOPIN) 0.5 MG tablet Take 0.5 mg by mouth 2 (two) times daily as needed for anxiety.   Yes Historical Provider, MD  GLIPIZIDE ER PO Take 10 mg by mouth 2 (two) times daily.   Yes Historical Provider, MD  hydrochlorothiazide (HYDRODIURIL) 25 MG tablet Take 25 mg by mouth daily.   Yes Historical Provider, MD  Insulin Glargine (LANTUS [place]) Inject 14 Units into the skin daily.   Yes Historical Provider, MD  lisinopril (PRINIVIL,ZESTRIL) 40 MG tablet Take 40 mg by mouth daily.   Yes Historical Provider, MD  metFORMIN (GLUCOPHAGE) 1000 MG tablet Take 1,000 mg by mouth 2 (two) times daily with a meal.   Yes Historical Provider, MD  metoprolol (LOPRESSOR) 50 MG tablet Take 50 mg by mouth 2 (two) times daily.   Yes Historical Provider, MD  nitroGLYCERIN (NITROSTAT) 0.4 MG SL tablet Place 0.4 mg under the tongue every 5 (five) minutes as needed for chest pain.   Yes Historical Provider, MD  prasugrel (EFFIENT) 10 MG TABS tablet Take 10 mg by mouth daily.   Yes Historical Provider, MD  sertraline (ZOLOFT) 100 MG tablet Take 100 mg by mouth daily.   Yes Historical Provider, MD    Family History  Family History  Problem Relation Age of Onset  . Heart attack Father   . Stroke Neg Hx   . Diabetes Father     Social History  Social History   Social History  . Marital Status: Married     Spouse Name: N/A  . Number of Children: N/A  . Years of Education: N/A   Occupational History  . Not on file.   Social History Main Topics  . Smoking status: Current Every Day Smoker -- 1.00 packs/day for 50 years    Types: Cigarettes  . Smokeless tobacco: Never Used     Comment: Counseled on quitting smoking  . Alcohol Use: 0.0 oz/week    0 Standard drinks or equivalent per week     Comment: "I was a drinker; I haven't had a drink since 1990"  . Drug Use: No  . Sexual Activity: No   Other Topics Concern  . Not on file   Social History Narrative     Review of Systems General:  No chills, fever, night sweats or  weight changes.  Cardiovascular:  No chest pain, dyspnea on exertion, edema, orthopnea, palpitations, paroxysmal nocturnal dyspnea. Dermatological: No rash, lesions/masses Respiratory: No cough, dyspnea Urologic: No hematuria, dysuria Abdominal:   No nausea, vomiting, diarrhea, bright red blood per rectum, melena, or hematemesis Neurologic:  No visual changes, wkns, changes in mental status. All other systems reviewed and are otherwise negative except as noted above.  Physical Exam  Blood pressure 124/64, pulse 66, height 5\' 9"  (1.753 m), weight 286 lb (129.729 kg).  General: Pleasant, NAD, obese Psych: Normal affect. Neuro: Alert and oriented X 3. Moves all extremities spontaneously. HEENT: Normal  Neck: Supple without bruits or JVD. Lungs:  Barrel chest, Resp regular and unlabored, wheezing B/L.  Heart: RRR no s3, s4, or murmurs. Abdomen: Soft, non-tender, non-distended, BS + x 4. No bruit above AA  Extremities: No clubbing, cyanosis, B/L LE edema L>R. DP/PT/Radials 2+ and equal bilaterally.  Accessory Clinical Findings  02/12/2014 LEFT VENTRICULOGRAM: Left ventricular angiogram was done in the 30 RAO projection and revealed normal LV size and function. EF 55%.   IMPRESSIONS: 1. 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 2. Moderate  proximal to mid LAD and circumflex disease with lesions in the 50-75% stenosis range. The stent in the first diagonal is patent. There is 70% stenosis proximal to the stent margin. 3. Overall normal left ventricular function 4. Complicated but successful DES implantation the region of in-stent restenosis in the right coronary reducing a greater than 95% stenosis to 0% with TIMI grade 3 flow.   RECOMMENDATION: Aspirin and Effient Discharge in a.m. If continued angina or recurrent right coronary restenosis, may need to consider coronary bypass surgery. If the right coronary stent is patent, LAD and circumflex could be treated with PCI when they become symptomatic.  I have encouraged the patient to discontinue smoking.  10 mics of IV nitroglycerin for 12 hours then discontinue to.  ECG - SR, 1. AVB, old inferior MI, unchanged from 02/23/2014    Assessment & Plan  70 year old male  1. CAD, S/P multiple stenting including RCA, diagonal, most recently obtained a stent to proximal RCA to her severe in-stent rest stenosis on 02/12/2014, currently on dual antiplatelet therapy with ASA and plavix, continue BB, ACE, statin, Imdur 30 mg daily. He is encouraged to start exercising and change diet.  Bruising, encouraged to use ASA/Plavix DAPT at least till October.  2. 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 No syncope. He is advised to watch his symptoms and if they get worse he should notify us and we will initiate 48 hour Holter monitor.  3. Acute on chronic diastolic CHF - start lasix 20 mg po daily, KCl 10 mEq daily, d/c HCTZ 25 mg po daily  4. IDDM -  poorly controlled, HbA1c 7.9 from 6.3 the last year, poor eating habits, encouraged to change.   5. Hypertension - well controlled, sometimes low, and fatigue.  6. Hyperlipidemia - well controlled on atorvastatin 80 mh QHS, last values in 08/2012 LDL 45, HDL 45, TAG 119, recheck  in 6 months  7. Diabetic neuropathy - started neurontin 300 mg po BID, he is not taking it, no improvement  8. Smoking and depression - started on Chantix , continues to smoke, he is smoking today on albuterol PRN only, we will start Spiriva.  Follow up in 3 months.  Lars Masson, MD 06/12/2015, 3:16 PM

## 2015-06-26 ENCOUNTER — Other Ambulatory Visit: Payer: Self-pay | Admitting: Cardiology

## 2015-07-13 ENCOUNTER — Other Ambulatory Visit: Payer: Self-pay | Admitting: Acute Care

## 2015-07-13 DIAGNOSIS — F17210 Nicotine dependence, cigarettes, uncomplicated: Secondary | ICD-10-CM

## 2015-08-11 ENCOUNTER — Ambulatory Visit: Payer: Self-pay | Admitting: Pulmonary Disease

## 2015-09-02 ENCOUNTER — Encounter (HOSPITAL_COMMUNITY): Payer: Self-pay | Admitting: Vascular Surgery

## 2015-09-02 ENCOUNTER — Emergency Department (HOSPITAL_COMMUNITY): Payer: Medicare Other

## 2015-09-02 ENCOUNTER — Emergency Department (HOSPITAL_COMMUNITY)
Admission: EM | Admit: 2015-09-02 | Discharge: 2015-09-03 | Discharge status: Against Medical Advice | Payer: Medicare Other | Attending: Emergency Medicine | Admitting: Emergency Medicine

## 2015-09-02 DIAGNOSIS — I252 Old myocardial infarction: Secondary | ICD-10-CM | POA: Insufficient documentation

## 2015-09-02 DIAGNOSIS — E119 Type 2 diabetes mellitus without complications: Secondary | ICD-10-CM | POA: Diagnosis not present

## 2015-09-02 DIAGNOSIS — Z7982 Long term (current) use of aspirin: Secondary | ICD-10-CM | POA: Insufficient documentation

## 2015-09-02 DIAGNOSIS — F329 Major depressive disorder, single episode, unspecified: Secondary | ICD-10-CM | POA: Insufficient documentation

## 2015-09-02 DIAGNOSIS — Z7984 Long term (current) use of oral hypoglycemic drugs: Secondary | ICD-10-CM | POA: Diagnosis not present

## 2015-09-02 DIAGNOSIS — F419 Anxiety disorder, unspecified: Secondary | ICD-10-CM | POA: Insufficient documentation

## 2015-09-02 DIAGNOSIS — R079 Chest pain, unspecified: Secondary | ICD-10-CM | POA: Diagnosis present

## 2015-09-02 DIAGNOSIS — K219 Gastro-esophageal reflux disease without esophagitis: Secondary | ICD-10-CM | POA: Insufficient documentation

## 2015-09-02 DIAGNOSIS — J449 Chronic obstructive pulmonary disease, unspecified: Secondary | ICD-10-CM | POA: Insufficient documentation

## 2015-09-02 DIAGNOSIS — M1990 Unspecified osteoarthritis, unspecified site: Secondary | ICD-10-CM | POA: Diagnosis not present

## 2015-09-02 DIAGNOSIS — I2510 Atherosclerotic heart disease of native coronary artery without angina pectoris: Secondary | ICD-10-CM | POA: Diagnosis not present

## 2015-09-02 DIAGNOSIS — I10 Essential (primary) hypertension: Secondary | ICD-10-CM | POA: Diagnosis not present

## 2015-09-02 DIAGNOSIS — Z79899 Other long term (current) drug therapy: Secondary | ICD-10-CM | POA: Insufficient documentation

## 2015-09-02 DIAGNOSIS — E785 Hyperlipidemia, unspecified: Secondary | ICD-10-CM | POA: Diagnosis not present

## 2015-09-02 DIAGNOSIS — F17210 Nicotine dependence, cigarettes, uncomplicated: Secondary | ICD-10-CM | POA: Diagnosis not present

## 2015-09-02 LAB — BASIC METABOLIC PANEL
ANION GAP: 13 (ref 5–15)
BUN: 23 mg/dL — ABNORMAL HIGH (ref 6–20)
CO2: 25 mmol/L (ref 22–32)
Calcium: 9.6 mg/dL (ref 8.9–10.3)
Chloride: 99 mmol/L — ABNORMAL LOW (ref 101–111)
Creatinine, Ser: 1.21 mg/dL (ref 0.61–1.24)
GFR calc Af Amer: >60 mL/min (ref >60)
GFR, EST NON AFRICAN AMERICAN: 59 mL/min — AB (ref >60)
GLUCOSE: 283 mg/dL — AB (ref 65–99)
POTASSIUM: 4 mmol/L (ref 3.5–5.1)
Sodium: 137 mmol/L (ref 135–145)

## 2015-09-02 LAB — CBC
HEMATOCRIT: 41.8 % (ref 39.0–52.0)
HEMOGLOBIN: 14 g/dL (ref 13.0–17.0)
MCH: 32.3 pg (ref 26.0–34.0)
MCHC: 33.5 g/dL (ref 30.0–36.0)
MCV: 96.3 fL (ref 78.0–100.0)
Platelets: 280 10*3/uL (ref 150–400)
RBC: 4.34 MIL/uL (ref 4.22–5.81)
RDW: 13.2 % (ref 11.5–15.5)
WBC: 15.1 10*3/uL — ABNORMAL HIGH (ref 4.0–10.5)

## 2015-09-02 LAB — I-STAT TROPONIN, ED
Troponin i, poc: 0 ng/mL (ref 0.00–0.08)
Troponin i, poc: 0 ng/mL (ref 0.00–0.08)

## 2015-09-02 NOTE — ED Provider Notes (Signed)
CSN: [id number]     Arrival date & time 09/02/15  1938 History   First MD Initiated Contact with Patient 09/02/15 2145     Chief Complaint  Patient presents with  . Chest Pain     (Consider location/radiation/quality/duration/timing/severity/associated sxs/prior Treatment) HPI   Patient to the ED for evaluation of CP. He has a PMH of palpitations, hypertension, hyperlipidemia, esophageal reflux, depression, anxiety, MI with stent placement, peumonia, IDDM and arthritis.   He sais that he has developed SOB and upper abdominal pain over the past few days when having a bowel movement. He was on Vicodin for 1 year and since then has had bowel problems.  Both times his symptoms started during a bowel movement with straining. He says the discomfort would start in his lower abdomen and work its way up to his chest and he would become diaphoretic. He took antacid yesterday which helped some. Today when it took place he took Klonopin, which helped but the most significant reliever of pain is flatulence.   He has noticed sensation of palpitations and having difficult controlling his BP and irregular HR.  He denies that he has had any N/V, diarrhea, diaphoresis. He has felt some fatigue.  PCP: Farris Has, MD Dwayne [name] is a 70 y.o.  male*  ROS: The patient denies diaphoresis, fever, headache, weakness (general or focal), confusion, change of vision,  dysphagia, aphagia, abdominal pains, nausea, vomiting, diarrhea, lower extremity swelling, rash, neck pain,   Past Medical History  Diagnosis Date  . Palpitations   . Hypertension, benign     Benign  . Hyperlipidemia   . Esophageal reflux   . Depression   . Anxiety   . Compression fracture of L2 (HCC) 03/08/2013  . Coronary artery disease   . Myocardial infarction (HCC) 1998 X 2    "probably a couple since 1998" (02/12/2014)  . COPD (chronic obstructive pulmonary disease) (HCC)   . Pneumonia ~ 2004 X 1  . IDDM (insulin dependent diabetes  mellitus) (HCC)   . Arthritis     "knees, elbows, hands" (02/12/2014)   Past Surgical History  Procedure Laterality Date  . Coronary angioplasty with stent placement  1999; 2014; 02/12/2014    "1; 2; 1"  . Coronary angioplasty  1998  . Cardiac catheterization  ~ 2012  . Cataract extraction w/ intraocular lens implant Right 10/2013  . Left heart catheterization with coronary angiogram N/A 02/12/2014    Procedure: LEFT HEART CATHETERIZATION WITH CORONARY ANGIOGRAM;  Surgeon: Lesleigh Noe, MD;  Location: [facility] CATH LAB;  Service: Cardiovascular;  Laterality: N/A;   Family History  Problem Relation Age of Onset  . Heart attack Father   . Stroke Neg Hx   . Diabetes Father    Social History  Substance Use Topics  . Smoking status: Current Every Day Smoker -- 1.00 packs/day for 50 years    Types: Cigarettes  . Smokeless tobacco: Never Used     Comment: Counseled on quitting smoking  . Alcohol Use: 0.0 oz/week    0 Standard drinks or equivalent per week     Comment: "I was a drinker; I haven't had a drink since 1990"    Review of Systems  Review of Systems All other systems negative except as documented in the HPI. All pertinent positives and negatives as reviewed in the HPI.   Allergies  Morphine and related  Home Medications   Prior to Admission medications   Medication Sig Start Date End Date Taking?  Authorizing Provider  albuterol (PROVENTIL HFA;VENTOLIN HFA) 108 (90 BASE) MCG/ACT inhaler Inhale 2 puffs into the lungs every 6 (six) hours as needed for wheezing.   Yes Historical Provider, MD  aspirin 81 MG chewable tablet Chew 1 tablet (81 mg total) by mouth daily. 02/13/14  Yes Brittainy Sherlynn Carbon, PA-C  atorvastatin (LIPITOR) 80 MG tablet Take 80 mg by mouth daily.   Yes Historical Provider, MD  clonazePAM (KLONOPIN) 0.5 MG tablet Take 0.25-0.5 mg by mouth 2 (two) times daily as needed for anxiety.  01/30/13  Yes Lars Masson, MD  clopidogrel (PLAVIX) 75 MG tablet TAKE  ONE TABLET BY MOUTH ONCE DAILY 06/29/15  Yes Lars Masson, MD  glipiZIDE (GLUCOTROL) 10 MG tablet Take 10 mg by mouth 2 (two) times daily before a meal.   Yes Historical Provider, MD  hydrochlorothiazide (HYDRODIURIL) 25 MG tablet Take 25 mg by mouth daily.   Yes Historical Provider, MD  insulin glargine (LANTUS) 100 unit/mL SOPN Inject 14 Units into the skin at bedtime.   Yes Historical Provider, MD  isosorbide mononitrate (IMDUR) 30 MG 24 hr tablet TAKE ONE TABLET BY MOUTH ONCE DAILY 05/20/15  Yes Lars Masson, MD  lisinopril (PRINIVIL,ZESTRIL) 40 MG tablet Take 40 mg by mouth daily.   Yes Historical Provider, MD  metFORMIN (GLUCOPHAGE) 1000 MG tablet Take 1 tablet (1,000 mg total) by mouth 2 (two) times daily with a meal. 02/16/14  Yes Brittainy Sherlynn Carbon, PA-C  metoprolol succinate (TOPROL-XL) 25 MG 24 hr tablet TAKE ONE TABLET BY MOUTH ONCE DAILY 05/20/15  Yes Lars Masson, MD  nitroGLYCERIN (NITROSTAT) 0.4 MG SL tablet Place 1 tablet (0.4 mg total) under the tongue every 5 (five) minutes as needed for chest pain. 01/20/14  Yes Lars Masson, MD  pantoprazole (PROTONIX) 40 MG tablet Take 1 tablet (40 mg total) by mouth daily. 03/11/13  Yes Joseph Art, DO  sertraline (ZOLOFT) 100 MG tablet Take 200 mg by mouth daily.    Yes Historical Provider, MD   BP 144/61 mmHg  Pulse 79  Temp(Src) 97.9 F (36.6 C) (Oral)  Resp 16  SpO2 98% Physical Exam  Constitutional: He appears well-developed and well-nourished. No distress.  HENT:  Head: Normocephalic and atraumatic.  Right Ear: Tympanic membrane and ear canal normal.  Left Ear: Tympanic membrane and ear canal normal.  Nose: Nose normal.  Mouth/Throat: Uvula is midline, oropharynx is clear and moist and mucous membranes are normal.  Eyes: Pupils are equal, round, and reactive to light.  Neck: Normal range of motion. Neck supple.  Cardiovascular: Normal rate and regular rhythm.   Pulmonary/Chest: Effort normal.  Abdominal:  Soft. Bowel sounds are normal. There is no tenderness. There is no rigidity, no rebound and no guarding.  No signs of abdominal distention, exam limited somewhat by large omentum  Musculoskeletal:  No LE swelling  Neurological: He is alert.  Acting at baseline  Skin: Skin is warm and dry. No rash noted.  Nursing note and vitals reviewed.   ED Course  Procedures (including critical care time) Labs Review Labs Reviewed  BASIC METABOLIC PANEL - Abnormal; Notable for the following:    Chloride 99 (*)    Glucose, Bld 283 (*)    BUN 23 (*)    GFR calc non Af Amer 59 (*)    All other components within normal limits  CBC - Abnormal; Notable for the following:    WBC 15.1 (*)    All other components  within normal limits  I-STAT TROPOININ, ED  Rosezena SensorI-STAT TROPOININ, ED    Imaging Review Dg Chest 2 View  09/02/2015  CLINICAL DATA:  Chest pain for 2 days. EXAM: CHEST  2 VIEW COMPARISON:  Chest CT 05/25/2015; chest radiograph 01/10/2014. FINDINGS: Stable cardiac and mediastinal contours. No consolidative pulmonary opacities. Biapical pleural parenchymal thickening. Emphysematous change. No pleural effusion or pneumothorax. Thoracic spine degenerative changes. IMPRESSION: No active cardiopulmonary disease. Electronically Signed   By: Annia Beltrew  Davis M.D.   On: 09/02/2015 21:00   I have personally reviewed and evaluated these images and lab results as part of my medical decision-making.   EKG Interpretation   Date/Time:  Wednesday September 02 2015 19:41:14 EDT Ventricular Rate:  85 PR Interval:  316 QRS Duration: 80 QT Interval:  332 QTC Calculation: 395 R Axis:   -35 Text Interpretation:  Sinus rhythm with 1st degree A-V block with Blocked  Premature atrial complexes Left axis deviation Inferior infarct , age  undetermined Abnormal ECG Confirmed by Lincoln Brighamees, Liz [phone]([id number]) on 09/02/2015  7:47:03 PM      MDM   Final diagnoses:  Gastroesophageal reflux disease, esophagitis presence not specified   Chest pain, unspecified chest pain type   11:12 pm  Discussed case with Dr. Radford PaxBeaton who will see patient as well. Symptoms sound GI related. First Troponin is negative, and EKG is abnormal but unchanged from prior. Chest xray unremarkable.  11:47 pm Symptoms are likely not cardiac but delta trop pending. Pt requesting to leave and is going to sign out AMA. He voices understanding that the etiology of his pain has not been fully determined and the risk of leaving prematurely is cardiopulmonary injury or even death.    Marlon Peliffany Digby Groeneveld, PA-C 09/02/15 2348  Nelva Nayobert Beaton, MD 09/03/15 [phone]

## 2015-09-02 NOTE — ED Notes (Signed)
Pt reports to the ED for eval of chest discomfort and upper abd pain x several days. Pt also reports some associated SOB. Pt reports that when he has a BM he has increased discomfort. Pt also reports some associated palpitations. Pt has also been having HTN and an irregular HR. Pt denies any N/V, dizziness, or diaphoresis. Is having some associated lightheadedness as well. Pt A&Ox4, resp e/u, and skin warm and dry.

## 2015-09-03 NOTE — ED Notes (Addendum)
Pt left AMA without signing form. Pt will be following up with his cardiologist tomorrow. Pt left with no chest pain at this time.

## 2015-09-08 ENCOUNTER — Ambulatory Visit (INDEPENDENT_AMBULATORY_CARE_PROVIDER_SITE_OTHER): Payer: Medicare Other | Admitting: Pulmonary Disease

## 2015-09-08 ENCOUNTER — Encounter: Payer: Self-pay | Admitting: Pulmonary Disease

## 2015-09-08 VITALS — BP 106/72 | HR 72 | Ht 69.5 in | Wt 288.4 lb

## 2015-09-08 DIAGNOSIS — J449 Chronic obstructive pulmonary disease, unspecified: Secondary | ICD-10-CM

## 2015-09-08 DIAGNOSIS — J441 Chronic obstructive pulmonary disease with (acute) exacerbation: Secondary | ICD-10-CM

## 2015-09-08 DIAGNOSIS — F17210 Nicotine dependence, cigarettes, uncomplicated: Secondary | ICD-10-CM | POA: Diagnosis not present

## 2015-09-08 NOTE — Progress Notes (Signed)
Subjective:    Patient ID: Dwayne [name], male    DOB: 05/15/1945, 70 y.o.   MRN: [id number]  HPI Follow up for management of COPD.  Dwayne [name] is a 70 year old active smoker with a diagnosis of COPD. He was referred by his primary care and cardiologist for further management. He has smoked a pack a day since the age of 70. He still continues to smoke and is trying to quit soon. He reports occasional dyspnea on exertion with wheezing. No cough, sputum production, fevers, chills, hemoptysis. He was given Spiriva by his cardiologist last month. He tried this about 4 times but stopped because he had dizziness and tightness in the chest after using it. We tried him on symbicort but He did not tolerate this either for similar complaints. He is also on albuterol when necessary but is not using it often.  Data: Echo (03/08/13) No prior study available for comparison. Mild LVH with   LVEF 60-65%, overall normal diastolic function. Upper   normal left and right atrial chamber size. Mildly   calcified aortic valve. Trivial mitral regurgitation.   Unable to assess PASP. No pericardial effusion.  CT scan chest (05/27/14) 1. Stable 4 mm right lower lobe and lingular nodules. These are likely benign and no further followup necessary. This recommendation follows the consensus statement: Guidelines for Management of Small Pulmonary Nodules Detected on CT Scans: A Statement from the Fleischner Society as published in Radiology 2005; 237:395-400. 2. Diffuse bronchial wall thickening with emphysema, as above; imaging findings suggestive of underlying COPD. 3. Atherosclerotic disease including multi vessel coronary artery Calcification.  Screening CT of chest 1/16/7 Lung-Rads category 3S, probably benign findings.  PFTs 03/12/15 FVC 3.41 (76%) FEV1 2.22 (67%) F/F 65 TLC 91% DLCO 59% Moderate obstructive disease and diffusion defect.  Social History: 50 pack year smoking history. Still smoking 1/2  ppd.  He is a [place] [employer]ologist, clinical[employer] veteran. 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  Family History: Father-diabetes, heart attack.  Past Medical History  Diagnosis Date  . Palpitations   . Hypertension, benign     Benign  . Hyperlipidemia   . Esophageal reflux   . Depression   . Anxiety   . Compression fracture of L2 (HCC) 03/08/2013  . Coronary artery disease   . Myocardial infarction (HCC) 1998 X 2    "probably a couple since 1998" (02/12/2014)  . COPD (chronic obstructive pulmonary disease) (HCC)   . Pneumonia ~ 2004 X 1  . IDDM (insulin dependent diabetes mellitus) (HCC)   . Arthritis     "knees, elbows, hands" (02/12/2014)     Current outpatient prescriptions:  .  albuterol (PROVENTIL HFA;VENTOLIN HFA) 108 (90 BASE) MCG/ACT inhaler, Inhale 2 puffs into the lungs every 6 (six) hours as needed for wheezing., Disp: , Rfl:  .  aspirin 81 MG chewable tablet, Chew 1 tablet (81 mg total) by mouth daily., Disp: , Rfl:  .  atorvastatin (LIPITOR) 80 MG tablet, Take 80 mg by mouth daily., Disp: , Rfl:  .  clonazePAM (KLONOPIN) 0.5 MG tablet, Take 0.25-0.5 mg by mouth 2 (two) times daily as needed for anxiety. , Disp: , Rfl:  .  clopidogrel (PLAVIX) 75 MG tablet, TAKE ONE TABLET BY MOUTH ONCE DAILY, Disp: 90 tablet, Rfl: 3 .  glipiZIDE (GLUCOTROL) 10 MG tablet, Take 10 mg by mouth 2 (two) times daily before a meal., Disp: ,  Rfl:  .  hydrochlorothiazide (HYDRODIURIL) 25 MG tablet, Take 25 mg by mouth daily., Disp: , Rfl:  .  insulin glargine (LANTUS) 100 unit/mL SOPN, Inject 14 Units into the skin at bedtime., Disp: , Rfl:  .  isosorbide mononitrate (IMDUR) 30 MG 24 hr tablet, TAKE ONE TABLET BY MOUTH ONCE DAILY, Disp: 90 tablet, Rfl: 1 .  lisinopril (PRINIVIL,ZESTRIL) 40 MG tablet, Take 40 mg by mouth daily., Disp: , Rfl:  .  metFORMIN (GLUCOPHAGE) 1000 MG tablet, Take 1 tablet (1,000 mg total) by mouth 2 (two)  times daily with a meal., Disp: , Rfl:  .  metoprolol succinate (TOPROL-XL) 25 MG 24 hr tablet, TAKE ONE TABLET BY MOUTH ONCE DAILY, Disp: 90 tablet, Rfl: 1 .  nitroGLYCERIN (NITROSTAT) 0.4 MG SL tablet, Place 1 tablet (0.4 mg total) under the tongue every 5 (five) minutes as needed for chest pain., Disp: 25 tablet, Rfl: 6 .  pantoprazole (PROTONIX) 40 MG tablet, Take 1 tablet (40 mg total) by mouth daily., Disp: 30 tablet, Rfl: 0 .  sertraline (ZOLOFT) 100 MG tablet, Take 200 mg by mouth daily. , Disp: , Rfl:   Review of Systems Denies any chest pain, cough, sputum production, palpitations. Has dyspnea on exertion. No wheezing, fevers, chills. No nausea, vomiting, diarrhea. All other review of systems is negative    Objective:   Physical Exam  Blood pressure 106/72, pulse 72, height 5' 9.5" (1.765 m), weight 288 lb 6.4 oz (130.817 kg), SpO2 95 %. Gen.: Awake, alert, oriented, no apparent distress. HEENT: PERRLA, EOMI Neck: Supple, no JVD or thyromegaly. CVS: S1 and S2 heard, no MRG RS: Clear to auscultation. Abdomen: Soft, positive BS. Extremities: No edema.    Assessment & Plan:  #1 Moderate COPD He is currently just on albuterol inhaler which he rarely uses. He did not tolerate a trial of LABA or LAMA. He has dyspnea on exertion but he says that it does not bother him much. He does not want to try another inhaler. If his symptoms worsen then we can reconsider  #2 Smoking. Patient continues to smoke. He he is planning to quit soon. He does not want any referral for a smoking cessation program.  #3 Lung nodules. Follow-up CT in 6 months.  Plan: - Albuterol PRN - Follow up CT of chest. - Smoking cessation.  Chilton Greathouse MD [facility] Pager [phone] If no answer or after 3pm call: [phone] 09/08/2015, 2:28 PM

## 2015-09-08 NOTE — Patient Instructions (Signed)
Okay to stop the Symbicort as you are having symptoms from it. Please call us back if you have any worsening of symptoms. Work on smoking cessation.  Return to clinic in 6 months.

## 2015-09-10 ENCOUNTER — Ambulatory Visit: Payer: Self-pay | Admitting: Cardiology

## 2015-09-22 ENCOUNTER — Other Ambulatory Visit: Payer: Self-pay | Admitting: Gastroenterology

## 2015-09-22 DIAGNOSIS — R1310 Dysphagia, unspecified: Secondary | ICD-10-CM

## 2015-09-25 ENCOUNTER — Ambulatory Visit
Admission: RE | Admit: 2015-09-25 | Discharge: 2015-09-25 | Disposition: A | Discharge status: Home / Self Care | Payer: Medicare Other | Source: Ambulatory Visit | Attending: Gastroenterology | Admitting: Gastroenterology

## 2015-09-25 DIAGNOSIS — R1310 Dysphagia, unspecified: Secondary | ICD-10-CM

## 2015-09-30 ENCOUNTER — Ambulatory Visit: Payer: Self-pay | Admitting: Cardiology

## 2015-10-30 ENCOUNTER — Encounter: Payer: Self-pay | Admitting: Cardiology

## 2015-11-03 ENCOUNTER — Encounter: Payer: Self-pay | Admitting: Cardiology

## 2015-11-03 ENCOUNTER — Ambulatory Visit (INDEPENDENT_AMBULATORY_CARE_PROVIDER_SITE_OTHER): Payer: Medicare Other | Admitting: Cardiology

## 2015-11-03 VITALS — BP 136/72 | HR 70 | Ht 69.5 in | Wt 289.4 lb

## 2015-11-03 DIAGNOSIS — E785 Hyperlipidemia, unspecified: Secondary | ICD-10-CM

## 2015-11-03 DIAGNOSIS — I25709 Atherosclerosis of coronary artery bypass graft(s), unspecified, with unspecified angina pectoris: Secondary | ICD-10-CM | POA: Diagnosis not present

## 2015-11-03 DIAGNOSIS — I10 Essential (primary) hypertension: Secondary | ICD-10-CM

## 2015-11-03 DIAGNOSIS — I5033 Acute on chronic diastolic (congestive) heart failure: Secondary | ICD-10-CM | POA: Diagnosis not present

## 2015-11-03 DIAGNOSIS — R42 Dizziness and giddiness: Secondary | ICD-10-CM

## 2015-11-03 MED ORDER — FUROSEMIDE 20 MG PO TABS
20.0000 mg | ORAL_TABLET | Freq: Every day | ORAL | Status: DC
Start: 2015-11-03 — End: 2016-07-20

## 2015-11-03 MED ORDER — METOPROLOL SUCCINATE ER 25 MG PO TB24: 12.5000 mg | ORAL_TABLET | Freq: Every day | ORAL | Status: DC

## 2015-11-03 NOTE — Patient Instructions (Signed)
Medication Instructions:   STOP TAKING HYDROCHLOROTHIAZIDE NOW  START TAKING LASIX 20 MG ONCE DAILY  DECREASE YOUR TOPROL XL TO 12.5 MG ONCE DAILY    Follow-Up:  3 MONTHS WITH DR Delton SeeNELSON       If you need a refill on your cardiac medications before your next appointment, please call your pharmacy.

## 2015-11-03 NOTE — Progress Notes (Signed)
Patient ID: Dwayne [name], male   DOB: Sep 22, 1945, 70 y.o.   MRN: [id number]    Patient Name: Dwayne [name] Date of Encounter: 11/03/2015  Primary Care Provider:  Farris Has, MD Primary Cardiologist:  Tobias Alexander  Chief complain: Fatigue  Problem List   Past Medical History  Diagnosis Date  . Palpitations   . Hypertension, benign     Benign  . Hyperlipidemia   . Esophageal reflux   . Depression   . Anxiety   . Compression fracture of L2 (HCC) 03/08/2013  . Coronary artery disease   . Myocardial infarction (HCC) 1998 X 2    "probably a couple since 1998" (02/12/2014)  . COPD (chronic obstructive pulmonary disease) (HCC)   . Pneumonia ~ 2004 X 1  . IDDM (insulin dependent diabetes mellitus) (HCC)   . Arthritis     "knees, elbows, hands" (02/12/2014)   Past Surgical History  Procedure Laterality Date  . Coronary angioplasty with stent placement  1999; 2014; 02/12/2014    "1; 2; 1"  . Coronary angioplasty  1998  . Cardiac catheterization  ~ 2012  . Cataract extraction w/ intraocular lens implant Right 10/2013  . Left heart catheterization with coronary angiogram N/A 02/12/2014    Procedure: LEFT HEART CATHETERIZATION WITH CORONARY ANGIOGRAM;  Surgeon: Lesleigh Noe, MD;  Location: [facility] CATH LAB;  Service: Cardiovascular;  Laterality: N/A;   Allergies  Allergies  Allergen Reactions  . Morphine And Related Swelling    Swelling of the tongue and face   HPI  70 year old male with h/o IDDM, obesity, hypertension, hyperlipidemia, ongoing smoking and CAD who just moved to [facility] and is here to establish cardiology care. 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 He was doing well until 2012 when he had another episode chest pain and received stent to RCA another the next day for in stent restenosis. He was sterted on Prasugrel after that. He describes his pain as reflux but stronger. He states  that since the last stent he has been asymptomatic. He is trying to exercise daily and has CP or SOB.    1 year followup - 12/17/2013 - the patient had a back injury in November 2014. He is otherwise doing well no chest pain, no shortness of breath, he walks mainly because of his back injury. He denies any lower extremity edema orthopnea medications. His only complaint is fatigue and sometimes low blood pressure.  03/28/2014 - the patient was seen in September for recurrent chest pains. He underwent nuclear stress test that was high risk stress test with moderate sized reversible inferior ischemia. He underwent cardiac catheterization on 08/13/2013 and was found to have severe in-stent restenosis in the proximal RCA stent. He received a stent and continued on dual antiplatelet therapy with aspirin and Effient. He is coming today refers that he is severely fatigued and short of breath. He states he didn't get relief he used to get when he got stents in the past. He also feels depressed and has hard time quitting smoking.  07/30/2014 - the patient is coming after 4 months, he denies any chest pain, he shortness of breath has improved, however he states that he has been feeling down, with no energy and fatigue. He stopped using narcotics for back pain that has resolved. He states that lately he hasn't been exercising at all and he has been eating  a lot of junk food that lead to 20 pound weight gain. He denies any palpitations or syncope. His bruising of his upper extremities has improved on Plavix when compared to Brilinta, he is compliant with his medicines. The patient is complaining of tingling and burning in all of his extremities.  06/12/2015 - 6 months follow up, the patient has had problems with wheezing and was started on Spiriva that keep him symptoms of chest tightness. He was switched to Symbicort by his pulmonary doctor with similar feelings but resolution of wheezing. He now doesn't feel short of  breath denies chest pain, orthopnea or proximal nocturnal dyspnea. He has noticed worsening lower extremity edema more in the left and right. He is compliant with his medications. He is also noticed dizziness but no syncope.   11/03/2015, the patient is coming after 3 months, he was switched from hydrochlorothiazide to Lasix at the last visit, however only took a few doses and felt dizzy so he discontinued. He continues to have lower extremity edema worse on the left than right. He denies any chest shortness of breath. He has been experiencing pain in his throat and upper chest and is scheduled for endoscopy. Denies any palpitations or syncope no orthopnea or paroxysmal nocturnal dyspnea.  Home Medications  Prior to Admission medications   Medication Sig Start Date End Date Taking? Authorizing Provider  albuterol (PROVENTIL) (2.5 MG/3ML) 0.083% nebulizer solution Take 2.5 mg by nebulization every 6 (six) hours as needed for wheezing.   Yes Historical Provider, MD  atorvastatin (LIPITOR) 80 MG tablet Take 80 mg by mouth daily.   Yes Historical Provider, MD  clonazePAM (KLONOPIN) 0.5 MG tablet Take 0.5 mg by mouth 2 (two) times daily as needed for anxiety.   Yes Historical Provider, MD  GLIPIZIDE ER PO Take 10 mg by mouth 2 (two) times daily.   Yes Historical Provider, MD  hydrochlorothiazide (HYDRODIURIL) 25 MG tablet Take 25 mg by mouth daily.   Yes Historical Provider, MD  Insulin Glargine (LANTUS [place]) Inject 14 Units into the skin daily.   Yes Historical Provider, MD  lisinopril (PRINIVIL,ZESTRIL) 40 MG tablet Take 40 mg by mouth daily.   Yes Historical Provider, MD  metFORMIN (GLUCOPHAGE) 1000 MG tablet Take 1,000 mg by mouth 2 (two) times daily with a meal.   Yes Historical Provider, MD  metoprolol (LOPRESSOR) 50 MG tablet Take 50 mg by mouth 2 (two) times daily.   Yes Historical Provider, MD  nitroGLYCERIN (NITROSTAT) 0.4 MG SL tablet Place 0.4 mg under the tongue every 5 (five) minutes as needed  for chest pain.   Yes Historical Provider, MD  prasugrel (EFFIENT) 10 MG TABS tablet Take 10 mg by mouth daily.   Yes Historical Provider, MD  sertraline (ZOLOFT) 100 MG tablet Take 100 mg by mouth daily.   Yes Historical Provider, MD    Family History  Family History  Problem Relation Age of Onset  . Heart attack Father   . Stroke Neg Hx   . Diabetes Father     Social History  Social History   Social History  . Marital Status: Married    Spouse Name: N/A  . Number of Children: N/A  . Years of Education: N/A   Occupational History  . Not on file.   Social History Main Topics  . Smoking status: Current Every Day Smoker -- 1.00 packs/day for 50 years    Types: Cigarettes  . Smokeless tobacco: Never Used     Comment:  Counseled on quitting smoking  . Alcohol Use: 0.0 oz/week    0 Standard drinks or equivalent per week     Comment: "I was a drinker; I haven't had a drink since 1990"  . Drug Use: No  . Sexual Activity: No   Other Topics Concern  . Not on file   Social History Narrative     Review of Systems General:  No chills, fever, night sweats or weight changes.  Cardiovascular:  No chest pain, dyspnea on exertion, edema, orthopnea, palpitations, paroxysmal nocturnal dyspnea. Dermatological: No rash, lesions/masses Respiratory: No cough, dyspnea Urologic: No hematuria, dysuria Abdominal:   No nausea, vomiting, diarrhea, bright red blood per rectum, melena, or hematemesis Neurologic:  No visual changes, wkns, changes in mental status. All other systems reviewed and are otherwise negative except as noted above.  Physical Exam  Blood pressure 136/72, pulse 70, height 5' 9.5" (1.765 m), weight 289 lb 6.4 oz (131.271 kg).  General: Pleasant, NAD, obese Psych: Normal affect. Neuro: Alert and oriented X 3. Moves all extremities spontaneously. HEENT: Normal  Neck: Supple without bruits or JVD. Lungs:  Barrel chest, Resp regular and unlabored, wheezing B/L.  Heart:  RRR no s3, s4, or murmurs. Abdomen: Soft, non-tender, non-distended, BS + x 4. No bruit above AA  Extremities: No clubbing, cyanosis, B/L LE edema L>R. DP/PT/Radials 2+ and equal bilaterally.  Accessory Clinical Findings  02/12/2014 LEFT VENTRICULOGRAM: Left ventricular angiogram was done in the 30 RAO projection and revealed normal LV size and function. EF 55%.   IMPRESSIONS: 1. 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 2. Moderate proximal to mid LAD and circumflex disease with lesions in the 50-75% stenosis range. The stent in the first diagonal is patent. There is 70% stenosis proximal to the stent margin. 3. Overall normal left ventricular function 4. Complicated but successful DES implantation the region of in-stent restenosis in the right coronary reducing a greater than 95% stenosis to 0% with TIMI grade 3 flow.   RECOMMENDATION: Aspirin and Effient Discharge in a.m. If continued angina or recurrent right coronary restenosis, may need to consider coronary bypass surgery. If the right coronary stent is patent, LAD and circumflex could be treated with PCI when they become symptomatic.  I have encouraged the patient to discontinue smoking.  10 mics of IV nitroglycerin for 12 hours then discontinue to.  ECG - SR, 1. AVB, old inferior MI, unchanged from 02/23/2014    Assessment & Plan  70 year old male  1. CAD, S/P multiple stenting including RCA, diagonal, most recently obtained a stent to proximal RCA to her severe in-stent rest stenosis on 02/12/2014, currently on dual antiplatelet therapy with ASA and plavix, continue BB, ACE, statin, Imdur 30 mg daily. Bruising, encouraged to use ASA/Plavix DAPT at least till October.  2. 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 The last EKG in April 2017 shows only first-degree AV block, his dizziness is only orthostatic, will decrease metoprolol to 12.5  mg by mouth daily.   3. Acute on chronic diastolic CHF - restart lasix 20 mg po daily, d/c HCTZ 25 mg po daily  4. IDDM -  poorly controlled, HbA1c 7.9 from 6.3 the last year, poor eating habits, encouraged to change.   5. Hypertension - well controlled, sometimes low, and fatigue. We will decrease the dose of Toprol-XL to 12.5 mg daily.  6. Hyperlipidemia - well controlled on atorvastatin 80 mh QHS, last  values in 08/2012 LDL 45, HDL 45, TAG 119, recheck in 6 months  7. Smoking and depression - started on Chantix , continues to smoke, he is smoking today on albuterol PRN only, we will start Spiriva.  Follow up in 3 months.  Tobias AlexanderKatarina Marilene Vath, MD 11/03/2015, 2:01 PM

## 2015-11-19 ENCOUNTER — Other Ambulatory Visit: Payer: Self-pay | Admitting: Cardiology

## 2015-11-20 ENCOUNTER — Other Ambulatory Visit: Payer: Self-pay | Admitting: Cardiology

## 2015-11-24 ENCOUNTER — Ambulatory Visit
Admission: RE | Admit: 2015-11-24 | Discharge: 2015-11-24 | Disposition: A | Discharge status: Home / Self Care | Payer: Medicare Other | Source: Ambulatory Visit | Attending: Acute Care | Admitting: Acute Care

## 2015-11-24 DIAGNOSIS — F17210 Nicotine dependence, cigarettes, uncomplicated: Secondary | ICD-10-CM

## 2015-11-25 ENCOUNTER — Other Ambulatory Visit: Payer: Self-pay | Admitting: Acute Care

## 2015-11-25 ENCOUNTER — Telehealth: Payer: Self-pay | Admitting: Acute Care

## 2015-11-25 DIAGNOSIS — F17210 Nicotine dependence, cigarettes, uncomplicated: Secondary | ICD-10-CM

## 2015-11-25 NOTE — Telephone Encounter (Signed)
I have called [name] to give him his LDCT results. There was no answer. I have left a message  asking him to call the office for results. If he calls please let him know that his scan was read as a Lung RADS 2. Lung RADS 2: nodules that are benign in appearance and behavior with a very low likelihood of becoming a clinically active cancer due to size or lack of growth. Recommendation per radiology is for a repeat LDCT in 12 months. Please let him know we will schedule his follow up scan in 12 months. Thanks so much.

## 2015-11-26 NOTE — Telephone Encounter (Signed)
Patient aware of results. Nothing further needed.  

## 2015-11-26 NOTE — Telephone Encounter (Signed)
LM with pt's wife for pt to return call.  

## 2015-11-26 NOTE — Telephone Encounter (Signed)
Pt returning call

## 2016-01-07 ENCOUNTER — Other Ambulatory Visit: Payer: Self-pay | Admitting: *Deleted

## 2016-01-07 ENCOUNTER — Telehealth: Payer: Self-pay | Admitting: Cardiology

## 2016-01-07 MED ORDER — METOPROLOL SUCCINATE ER 25 MG PO TB24: 12.5000 mg | ORAL_TABLET | Freq: Every day | ORAL | 3 refills | Status: DC

## 2016-01-07 MED ORDER — METOPROLOL SUCCINATE ER 25 MG PO TB24: 25.0000 mg | ORAL_TABLET | Freq: Every day | ORAL | 3 refills | Status: DC

## 2016-01-07 NOTE — Telephone Encounter (Signed)
°*  STAT* If patient is at the pharmacy, call can be transferred to refill team.   1. Which medications need to be refilled? (please list name of each medication and dose if known) Toprol xl 25.mg  2. Which pharmacy/location (including street and city if local pharmacy) is medication to be sent to? [facility] on [address]   3. Do they need a 30 day or 90 day supply? 90

## 2016-01-14 ENCOUNTER — Other Ambulatory Visit: Payer: Self-pay | Admitting: Gastroenterology

## 2016-01-14 DIAGNOSIS — R1011 Right upper quadrant pain: Secondary | ICD-10-CM

## 2016-01-15 NOTE — Telephone Encounter (Signed)
Please close Encounter °

## 2016-01-25 ENCOUNTER — Ambulatory Visit
Admission: RE | Admit: 2016-01-25 | Discharge: 2016-01-25 | Disposition: A | Discharge status: Home / Self Care | Payer: Medicare Other | Source: Ambulatory Visit | Attending: Gastroenterology | Admitting: Gastroenterology

## 2016-01-25 DIAGNOSIS — R1011 Right upper quadrant pain: Secondary | ICD-10-CM

## 2016-01-26 ENCOUNTER — Encounter: Payer: Self-pay | Admitting: Cardiology

## 2016-01-26 NOTE — Telephone Encounter (Signed)
Spoke with pt and attempted to schedule.  Pt seen in June and was told to f/u in 3 months.  Pt prefers appt 11AM or later due to transportation.  Advised pt I would route a message to Dr. Lindaann SloughNelson's nurse for follow up.  Pt grateful for assistance.

## 2016-02-09 ENCOUNTER — Ambulatory Visit: Payer: Self-pay | Admitting: General Surgery

## 2016-02-09 ENCOUNTER — Ambulatory Visit: Payer: Medicare Other | Admitting: Cardiology

## 2016-02-11 ENCOUNTER — Encounter: Payer: Self-pay | Admitting: *Deleted

## 2016-02-15 ENCOUNTER — Other Ambulatory Visit: Payer: Self-pay | Admitting: Cardiology

## 2016-02-17 ENCOUNTER — Telehealth: Payer: Self-pay | Admitting: Cardiology

## 2016-02-17 NOTE — Telephone Encounter (Signed)
Informed the pt that this information/clearance letter was faxed to [facility] office x 3.  Advised the pt to call their office tomorrow and ask if fax received.  Advised the pt that if Dr Jamse MeadHoxworth's CMA has not received this, then they should call our office back to provide a working fax number.  Pt verbalized understanding and agrees with this plan.  Pt gracious for all the assistance provided.

## 2016-02-17 NOTE — Telephone Encounter (Signed)
New Message  Pt voiced wanting us to send the letter for surgical clearance that's on my chart to the MD's office.  Please f/u

## 2016-03-03 NOTE — Patient Instructions (Addendum)
Sargent Mankey  03/03/2016   Your procedure is scheduled on: 03-07-16  Report to [facility] Main  Entrance take [facility]  elevators to 3rd floor to  Short Stay Center at  0830 AM.  Call this number if you have problems the morning of surgery [phone]   Remember: ONLY 1 PERSON MAY GO WITH YOU TO SHORT STAY TO GET  READY MORNING OF YOUR SURGERY.  Do not eat food or drink liquids :After Midnight.     Take these medicines the morning of surgery with A SIP OF WATER: Clonazepam. Pantoprazole. Inhalers-usual. Use Plavix per MD instructions. DO NOT TAKE ANY DIABETIC MEDICATIONS DAY OF YOUR SURGERY                               You may not have any metal on your body including hair pins and              piercings  Do not wear jewelry, make-up, lotions, powders or perfumes, deodorant             Do not wear nail polish.  Do not shave  48 hours prior to surgery.              Men may shave face and neck.   Do not bring valuables to the hospital. [place] IS NOT             RESPONSIBLE   FOR VALUABLES.  Contacts, dentures or bridgework may not be worn into surgery.  Leave suitcase in the car. After surgery it may be brought to your room.     Patients discharged the day of surgery will not be allowed to drive home.  Name and phone number of your driver: Linda-spouse [phone]  Special Instructions: N/A              Please read over the following fact sheets you were given: _____________________________________________________________________             [facility] - Preparing for Surgery Before surgery, you can play an important role.  Because skin is not sterile, your skin needs to be as free of germs as possible.  You can reduce the number of germs on your skin by washing with CHG (chlorahexidine gluconate) soap before surgery.  CHG is an antiseptic cleaner which kills germs and bonds with the skin to continue killing germs even after washing. Please DO  NOT use if you have an allergy to CHG or antibacterial soaps.  If your skin becomes reddened/irritated stop using the CHG and inform your nurse when you arrive at Short Stay. Do not shave (including legs and underarms) for at least 48 hours prior to the first CHG shower.  You may shave your face/neck. Please follow these instructions carefully:  1.  Shower with CHG Soap the night before surgery and the  morning of Surgery.  2.  If you choose to wash your hair, wash your hair first as usual with your  normal  shampoo.  3.  After you shampoo, rinse your hair and body thoroughly to remove the  shampoo.                           4.  Use CHG as you would any other liquid soap.  You  can apply chg directly  to the skin and wash                       Gently with a scrungie or clean washcloth.  5.  Apply the CHG Soap to your body ONLY FROM THE NECK DOWN.   Do not use on face/ open                           Wound or open sores. Avoid contact with eyes, ears mouth and genitals (private parts).                       Wash face,  Genitals (private parts) with your normal soap.             6.  Wash thoroughly, paying special attention to the area where your surgery  will be performed.  7.  Thoroughly rinse your body with warm water from the neck down.  8.  DO NOT shower/wash with your normal soap after using and rinsing off  the CHG Soap.                9.  Pat yourself dry with a clean towel.            10.  Wear clean pajamas.            11.  Place clean sheets on your bed the night of your first shower and do not  sleep with pets. Day of Surgery : Do not apply any lotions/deodorants the morning of surgery.  Please wear clean clothes to the hospital/surgery center.  FAILURE TO FOLLOW THESE INSTRUCTIONS MAY RESULT IN THE CANCELLATION OF YOUR SURGERY  ________________________________________________________________________ How to Manage Your Diabetes Before and After Surgery  Why is it important to control  my blood sugar before and after surgery? . Improving blood sugar levels before and after surgery helps healing and can limit problems. . A way of improving blood sugar control is eating a healthy diet by: o  Eating less sugar and carbohydrates o  Increasing activity/exercise o  Talking with your doctor about reaching your blood sugar goals . High blood sugars (greater than 180 mg/dL) can raise your risk of infections and slow your recovery, so you will need to focus on controlling your diabetes during the weeks before surgery. . Make sure that the doctor who takes care of your diabetes knows about your planned surgery including the date and location.  How do I manage my blood sugar before surgery? . Check your blood sugar at least 4 times a day, starting 2 days before surgery, to make sure that the level is not too high or low. o Check your blood sugar the morning of your surgery when you wake up and every 2 hours until you get to the Short Stay unit. . If your blood sugar is less than 70 mg/dL, you will need to treat for low blood sugar: o Do not take insulin. o Treat a low blood sugar (less than 70 mg/dL) with  cup of clear juice (cranberry or apple), 4 glucose tablets, OR glucose gel. o Recheck blood sugar in 15 minutes after treatment (to make sure it is greater than 70 mg/dL). If your blood sugar is not greater than 70 mg/dL on recheck, call [phone] for further instructions. . Report your blood sugar to the short stay nurse when you get to Short  Stay.  . If you are admitted to the hospital after surgery: o Your blood sugar will be checked by the staff and you will probably be given insulin after surgery (instead of oral diabetes medicines) to make sure you have good blood sugar levels. o The goal for blood sugar control after surgery is 80-180 mg/dL.   WHAT DO I DO ABOUT MY DIABETES MEDICATION?  Marland Kitchen. Do not take oral diabetes medicines (pills) the morning of surgery.  . THE NIGHT  BEFORE SURGERY, take ___8 ________ units of __Lantus_________insulin. Night nefore: take no PM diabetic meds.       . THE MORNING OF SURGERY, take _____0________ units of __Lantus________insulin.  . The day of surgery, do not take other diabetes injectables, including Byetta (exenatide), Bydureon (exenatide ER), Victoza (liraglutide), or Trulicity (dulaglutide).  . If your CBG is greater than 220 mg/dL, you may take  of your sliding scale  . (correction) dose of insulin.    For patients with insulin pumps: Contact your diabetes doctor for specific instructions before surgery. Decrease basal rates by 20% at midnight the night before your surgery. Note that if your surgery is planned to be longer than 2 hours, your insulin pump will be removed and intravenous (IV) insulin will be started and managed by the nurses and the anesthesiologist. You will be able to restart your insulin pump once you are awake and able to manage it.  Make sure to bring insulin pump supplies to the hospital with you in case the  site needs to be changed.  Patient Signature:  Date:   Nurse Signature:  Date:   Reviewed and Endorsed by [facility] Patient Education Committee, August 2015

## 2016-03-04 ENCOUNTER — Encounter (HOSPITAL_COMMUNITY): Payer: Self-pay

## 2016-03-04 ENCOUNTER — Encounter (HOSPITAL_COMMUNITY)
Admission: RE | Admit: 2016-03-04 | Discharge: 2016-03-04 | Disposition: A | Discharge status: Home / Self Care | Payer: Medicare Other | Source: Ambulatory Visit | Attending: General Surgery | Admitting: General Surgery

## 2016-03-04 DIAGNOSIS — I110 Hypertensive heart disease with heart failure: Secondary | ICD-10-CM | POA: Diagnosis not present

## 2016-03-04 DIAGNOSIS — Z8249 Family history of ischemic heart disease and other diseases of the circulatory system: Secondary | ICD-10-CM | POA: Diagnosis not present

## 2016-03-04 DIAGNOSIS — I2510 Atherosclerotic heart disease of native coronary artery without angina pectoris: Secondary | ICD-10-CM | POA: Diagnosis not present

## 2016-03-04 DIAGNOSIS — E119 Type 2 diabetes mellitus without complications: Secondary | ICD-10-CM | POA: Diagnosis not present

## 2016-03-04 DIAGNOSIS — J449 Chronic obstructive pulmonary disease, unspecified: Secondary | ICD-10-CM | POA: Diagnosis not present

## 2016-03-04 DIAGNOSIS — Z794 Long term (current) use of insulin: Secondary | ICD-10-CM | POA: Diagnosis not present

## 2016-03-04 DIAGNOSIS — K8010 Calculus of gallbladder with chronic cholecystitis without obstruction: Secondary | ICD-10-CM | POA: Diagnosis present

## 2016-03-04 DIAGNOSIS — Z803 Family history of malignant neoplasm of breast: Secondary | ICD-10-CM | POA: Diagnosis not present

## 2016-03-04 DIAGNOSIS — K219 Gastro-esophageal reflux disease without esophagitis: Secondary | ICD-10-CM | POA: Diagnosis not present

## 2016-03-04 DIAGNOSIS — I509 Heart failure, unspecified: Secondary | ICD-10-CM | POA: Diagnosis not present

## 2016-03-04 DIAGNOSIS — M1990 Unspecified osteoarthritis, unspecified site: Secondary | ICD-10-CM | POA: Diagnosis not present

## 2016-03-04 DIAGNOSIS — F329 Major depressive disorder, single episode, unspecified: Secondary | ICD-10-CM | POA: Diagnosis not present

## 2016-03-04 DIAGNOSIS — Z7982 Long term (current) use of aspirin: Secondary | ICD-10-CM | POA: Diagnosis not present

## 2016-03-04 DIAGNOSIS — F17210 Nicotine dependence, cigarettes, uncomplicated: Secondary | ICD-10-CM | POA: Diagnosis not present

## 2016-03-04 DIAGNOSIS — F419 Anxiety disorder, unspecified: Secondary | ICD-10-CM | POA: Diagnosis not present

## 2016-03-04 DIAGNOSIS — Z885 Allergy status to narcotic agent status: Secondary | ICD-10-CM | POA: Diagnosis not present

## 2016-03-04 DIAGNOSIS — Z6841 Body Mass Index (BMI) 40.0 and over, adult: Secondary | ICD-10-CM | POA: Diagnosis not present

## 2016-03-04 DIAGNOSIS — E669 Obesity, unspecified: Secondary | ICD-10-CM | POA: Diagnosis not present

## 2016-03-04 DIAGNOSIS — Z833 Family history of diabetes mellitus: Secondary | ICD-10-CM | POA: Diagnosis not present

## 2016-03-04 HISTORY — DX: Dysphagia, unspecified: R13.10

## 2016-03-04 HISTORY — DX: Calculus of gallbladder without cholecystitis without obstruction: K80.20

## 2016-03-04 LAB — COMPREHENSIVE METABOLIC PANEL
ALT: 12 U/L — AB (ref 17–63)
ANION GAP: 10 (ref 5–15)
AST: 17 U/L (ref 15–41)
Albumin: 4.3 g/dL (ref 3.5–5.0)
Alkaline Phosphatase: 58 U/L (ref 38–126)
BUN: 14 mg/dL (ref 6–20)
CHLORIDE: 100 mmol/L — AB (ref 101–111)
CO2: 28 mmol/L (ref 22–32)
CREATININE: 0.98 mg/dL (ref 0.61–1.24)
Calcium: 9.1 mg/dL (ref 8.9–10.3)
Glucose, Bld: 180 mg/dL — ABNORMAL HIGH (ref 65–99)
POTASSIUM: 3.7 mmol/L (ref 3.5–5.1)
Sodium: 138 mmol/L (ref 135–145)
Total Bilirubin: 0.6 mg/dL (ref 0.3–1.2)
Total Protein: 7.4 g/dL (ref 6.5–8.1)

## 2016-03-04 LAB — CBC WITH DIFFERENTIAL/PLATELET
Basophils Absolute: 0 10*3/uL (ref 0.0–0.1)
Basophils Relative: 0 %
EOS PCT: 1 %
Eosinophils Absolute: 0.1 10*3/uL (ref 0.0–0.7)
HCT: 38.3 % — ABNORMAL LOW (ref 39.0–52.0)
Hemoglobin: 13.4 g/dL (ref 13.0–17.0)
LYMPHS ABS: 2.6 10*3/uL (ref 0.7–4.0)
LYMPHS PCT: 25 %
MCH: 32.9 pg (ref 26.0–34.0)
MCHC: 35 g/dL (ref 30.0–36.0)
MCV: 94.1 fL (ref 78.0–100.0)
MONO ABS: 0.9 10*3/uL (ref 0.1–1.0)
MONOS PCT: 9 %
Neutro Abs: 6.8 10*3/uL (ref 1.7–7.7)
Neutrophils Relative %: 65 %
PLATELETS: 255 10*3/uL (ref 150–400)
RBC: 4.07 MIL/uL — ABNORMAL LOW (ref 4.22–5.81)
RDW: 13.4 % (ref 11.5–15.5)
WBC: 10.5 10*3/uL (ref 4.0–10.5)

## 2016-03-04 LAB — GLUCOSE, CAPILLARY: GLUCOSE-CAPILLARY: 166 mg/dL — AB (ref 65–99)

## 2016-03-04 NOTE — Pre-Procedure Instructions (Signed)
EKG 4'17, Stress 9'15, CXR 4'17 Epic. CBG = 166 @ 1425 PM.

## 2016-03-04 NOTE — Progress Notes (Signed)
Your Pt has screened with an elevated risk for obstructive sleep apnea using the Stop-Bang tool during a presurgical  Visit. 

## 2016-03-05 LAB — HEMOGLOBIN A1C
HEMOGLOBIN A1C: 7.9 % — AB (ref 4.8–5.6)
MEAN PLASMA GLUCOSE: 180 mg/dL

## 2016-03-06 MED ORDER — DEXTROSE 5 % IV SOLN
3.0000 g | INTRAVENOUS | Status: AC
  Administered 2016-03-07: 3 g via INTRAVENOUS
  Filled 2016-03-06: qty 3

## 2016-03-07 ENCOUNTER — Encounter (HOSPITAL_COMMUNITY): Payer: Self-pay | Admitting: Certified Registered Nurse Anesthetist

## 2016-03-07 ENCOUNTER — Observation Stay (HOSPITAL_COMMUNITY)
Admission: RE | Admit: 2016-03-07 | Discharge: 2016-03-08 | Disposition: A | Discharge status: Home / Self Care | Payer: Medicare Other | Source: Ambulatory Visit | Attending: General Surgery | Admitting: General Surgery

## 2016-03-07 ENCOUNTER — Ambulatory Visit (HOSPITAL_COMMUNITY): Payer: Medicare Other | Admitting: Certified Registered Nurse Anesthetist

## 2016-03-07 ENCOUNTER — Encounter (HOSPITAL_COMMUNITY)
Admission: RE | Disposition: A | Discharge status: Home / Self Care | Payer: Self-pay | Source: Ambulatory Visit | Attending: General Surgery

## 2016-03-07 ENCOUNTER — Ambulatory Visit (HOSPITAL_COMMUNITY): Payer: Medicare Other

## 2016-03-07 DIAGNOSIS — Z8249 Family history of ischemic heart disease and other diseases of the circulatory system: Secondary | ICD-10-CM | POA: Insufficient documentation

## 2016-03-07 DIAGNOSIS — Z833 Family history of diabetes mellitus: Secondary | ICD-10-CM | POA: Insufficient documentation

## 2016-03-07 DIAGNOSIS — K8010 Calculus of gallbladder with chronic cholecystitis without obstruction: Secondary | ICD-10-CM | POA: Diagnosis not present

## 2016-03-07 DIAGNOSIS — I110 Hypertensive heart disease with heart failure: Secondary | ICD-10-CM | POA: Insufficient documentation

## 2016-03-07 DIAGNOSIS — Z803 Family history of malignant neoplasm of breast: Secondary | ICD-10-CM | POA: Insufficient documentation

## 2016-03-07 DIAGNOSIS — I2510 Atherosclerotic heart disease of native coronary artery without angina pectoris: Secondary | ICD-10-CM | POA: Insufficient documentation

## 2016-03-07 DIAGNOSIS — F419 Anxiety disorder, unspecified: Secondary | ICD-10-CM | POA: Insufficient documentation

## 2016-03-07 DIAGNOSIS — Z794 Long term (current) use of insulin: Secondary | ICD-10-CM | POA: Insufficient documentation

## 2016-03-07 DIAGNOSIS — J449 Chronic obstructive pulmonary disease, unspecified: Secondary | ICD-10-CM | POA: Diagnosis not present

## 2016-03-07 DIAGNOSIS — F17210 Nicotine dependence, cigarettes, uncomplicated: Secondary | ICD-10-CM | POA: Insufficient documentation

## 2016-03-07 DIAGNOSIS — I509 Heart failure, unspecified: Secondary | ICD-10-CM | POA: Insufficient documentation

## 2016-03-07 DIAGNOSIS — F329 Major depressive disorder, single episode, unspecified: Secondary | ICD-10-CM | POA: Insufficient documentation

## 2016-03-07 DIAGNOSIS — Z885 Allergy status to narcotic agent status: Secondary | ICD-10-CM | POA: Insufficient documentation

## 2016-03-07 DIAGNOSIS — Z7982 Long term (current) use of aspirin: Secondary | ICD-10-CM | POA: Insufficient documentation

## 2016-03-07 DIAGNOSIS — K219 Gastro-esophageal reflux disease without esophagitis: Secondary | ICD-10-CM | POA: Insufficient documentation

## 2016-03-07 DIAGNOSIS — E119 Type 2 diabetes mellitus without complications: Secondary | ICD-10-CM | POA: Insufficient documentation

## 2016-03-07 DIAGNOSIS — E669 Obesity, unspecified: Secondary | ICD-10-CM | POA: Insufficient documentation

## 2016-03-07 DIAGNOSIS — M1990 Unspecified osteoarthritis, unspecified site: Secondary | ICD-10-CM | POA: Insufficient documentation

## 2016-03-07 DIAGNOSIS — R1011 Right upper quadrant pain: Secondary | ICD-10-CM

## 2016-03-07 DIAGNOSIS — Z6841 Body Mass Index (BMI) 40.0 and over, adult: Secondary | ICD-10-CM | POA: Insufficient documentation

## 2016-03-07 HISTORY — PX: CHOLECYSTECTOMY: SHX55

## 2016-03-07 LAB — GLUCOSE, CAPILLARY
GLUCOSE-CAPILLARY: 131 mg/dL — AB (ref 65–99)
Glucose-Capillary: 168 mg/dL — ABNORMAL HIGH (ref 65–99)
Glucose-Capillary: 179 mg/dL — ABNORMAL HIGH (ref 65–99)
Glucose-Capillary: 228 mg/dL — ABNORMAL HIGH (ref 65–99)

## 2016-03-07 SURGERY — LAPAROSCOPIC CHOLECYSTECTOMY WITH INTRAOPERATIVE CHOLANGIOGRAM
Anesthesia: General | Site: Abdomen

## 2016-03-07 MED ORDER — ATORVASTATIN CALCIUM 80 MG PO TABS
80.0000 mg | ORAL_TABLET | Freq: Every day | ORAL | Status: DC
  Filled 2016-03-07 (×2): qty 1

## 2016-03-07 MED ORDER — INSULIN ASPART 100 UNIT/ML ~~LOC~~ SOLN
0.0000 [IU] | Freq: Three times a day (TID) | SUBCUTANEOUS | Status: DC
  Administered 2016-03-07: 2 [IU] via SUBCUTANEOUS
  Administered 2016-03-08: 3 [IU] via SUBCUTANEOUS

## 2016-03-07 MED ORDER — METOPROLOL SUCCINATE ER 25 MG PO TB24
25.0000 mg | ORAL_TABLET | Freq: Every day | ORAL | Status: DC
  Administered 2016-03-07 – 2016-03-08 (×2): 25 mg via ORAL
  Filled 2016-03-07 (×2): qty 1

## 2016-03-07 MED ORDER — PANTOPRAZOLE SODIUM 40 MG PO TBEC
40.0000 mg | DELAYED_RELEASE_TABLET | Freq: Every day | ORAL | Status: DC
  Administered 2016-03-07 – 2016-03-08 (×2): 40 mg via ORAL
  Filled 2016-03-07 (×2): qty 1

## 2016-03-07 MED ORDER — BUPIVACAINE HCL (PF) 0.5 % IJ SOLN
INTRAMUSCULAR | Status: AC
  Filled 2016-03-07: qty 30

## 2016-03-07 MED ORDER — IOPAMIDOL (ISOVUE-300) INJECTION 61%
INTRAVENOUS | Status: DC | PRN
  Administered 2016-03-07: 13 mL

## 2016-03-07 MED ORDER — SUGAMMADEX SODIUM 200 MG/2ML IV SOLN
INTRAVENOUS | Status: AC
  Filled 2016-03-07: qty 2

## 2016-03-07 MED ORDER — ROCURONIUM BROMIDE 50 MG/5ML IV SOSY
PREFILLED_SYRINGE | INTRAVENOUS | Status: AC
  Filled 2016-03-07: qty 5

## 2016-03-07 MED ORDER — ONDANSETRON 4 MG PO TBDP: 4.0000 mg | ORAL_TABLET | Freq: Four times a day (QID) | ORAL | Status: DC | PRN

## 2016-03-07 MED ORDER — ISOSORBIDE MONONITRATE ER 30 MG PO TB24
30.0000 mg | ORAL_TABLET | Freq: Every day | ORAL | Status: DC
  Administered 2016-03-07 – 2016-03-08 (×2): 30 mg via ORAL
  Filled 2016-03-07 (×2): qty 1

## 2016-03-07 MED ORDER — INFLUENZA VAC SPLIT QUAD 0.5 ML IM SUSY
0.5000 mL | PREFILLED_SYRINGE | INTRAMUSCULAR | Status: AC
  Administered 2016-03-08: 0.5 mL via INTRAMUSCULAR
  Filled 2016-03-07: qty 0.5

## 2016-03-07 MED ORDER — DEXAMETHASONE SODIUM PHOSPHATE 10 MG/ML IJ SOLN
INTRAMUSCULAR | Status: AC
  Filled 2016-03-07: qty 1

## 2016-03-07 MED ORDER — LACTATED RINGERS IR SOLN
Status: DC | PRN
  Administered 2016-03-07: 1000 mL

## 2016-03-07 MED ORDER — ENOXAPARIN SODIUM 40 MG/0.4ML ~~LOC~~ SOLN
40.0000 mg | SUBCUTANEOUS | Status: DC
  Administered 2016-03-08: 40 mg via SUBCUTANEOUS
  Filled 2016-03-07: qty 0.4

## 2016-03-07 MED ORDER — IOPAMIDOL (ISOVUE-300) INJECTION 61%
INTRAVENOUS | Status: AC
  Filled 2016-03-07: qty 50

## 2016-03-07 MED ORDER — BUPIVACAINE HCL 0.5 % IJ SOLN
INTRAMUSCULAR | Status: DC | PRN
  Administered 2016-03-07: 22 mL

## 2016-03-07 MED ORDER — GLYCOPYRROLATE 0.2 MG/ML IJ SOLN
INTRAMUSCULAR | Status: DC | PRN
Start: 2016-03-07 — End: 2016-03-07
  Administered 2016-03-07 (×2): 0.2 mg via INTRAVENOUS

## 2016-03-07 MED ORDER — ONDANSETRON HCL 4 MG/2ML IJ SOLN
INTRAMUSCULAR | Status: AC
  Filled 2016-03-07: qty 2

## 2016-03-07 MED ORDER — CLOPIDOGREL BISULFATE 75 MG PO TABS
75.0000 mg | ORAL_TABLET | Freq: Every day | ORAL | Status: DC
  Administered 2016-03-07 – 2016-03-08 (×2): 75 mg via ORAL
  Filled 2016-03-07 (×2): qty 1

## 2016-03-07 MED ORDER — FENTANYL CITRATE (PF) 100 MCG/2ML IJ SOLN
INTRAMUSCULAR | Status: AC
  Filled 2016-03-07: qty 2

## 2016-03-07 MED ORDER — SUCCINYLCHOLINE CHLORIDE 20 MG/ML IJ SOLN
INTRAMUSCULAR | Status: AC
  Filled 2016-03-07: qty 1

## 2016-03-07 MED ORDER — OXYCODONE HCL 5 MG PO TABS
5.0000 mg | ORAL_TABLET | ORAL | Status: DC | PRN
  Administered 2016-03-07 – 2016-03-08 (×3): 5 mg via ORAL
  Filled 2016-03-07 (×3): qty 1

## 2016-03-07 MED ORDER — HYDROMORPHONE HCL 1 MG/ML IJ SOLN
1.0000 mg | INTRAMUSCULAR | Status: DC | PRN
  Administered 2016-03-07: 1 mg via INTRAVENOUS
  Filled 2016-03-07: qty 1

## 2016-03-07 MED ORDER — MIDAZOLAM HCL 5 MG/5ML IJ SOLN
INTRAMUSCULAR | Status: DC | PRN
  Administered 2016-03-07 (×2): 1 mg via INTRAVENOUS

## 2016-03-07 MED ORDER — FENTANYL CITRATE (PF) 100 MCG/2ML IJ SOLN: 25.0000 ug | INTRAMUSCULAR | Status: DC | PRN

## 2016-03-07 MED ORDER — 0.9 % SODIUM CHLORIDE (POUR BTL) OPTIME
TOPICAL | Status: DC | PRN
  Administered 2016-03-07: 1000 mL

## 2016-03-07 MED ORDER — PROPOFOL 10 MG/ML IV BOLUS
INTRAVENOUS | Status: AC
  Filled 2016-03-07: qty 20

## 2016-03-07 MED ORDER — SERTRALINE HCL 50 MG PO TABS
200.0000 mg | ORAL_TABLET | Freq: Every day | ORAL | Status: DC
  Administered 2016-03-07: 200 mg via ORAL
  Filled 2016-03-07: qty 2
  Filled 2016-03-07: qty 4

## 2016-03-07 MED ORDER — FENTANYL CITRATE (PF) 100 MCG/2ML IJ SOLN
INTRAMUSCULAR | Status: DC | PRN
  Administered 2016-03-07 (×2): 50 ug via INTRAVENOUS
  Administered 2016-03-07 (×2): 25 ug via INTRAVENOUS
  Administered 2016-03-07 (×3): 50 ug via INTRAVENOUS

## 2016-03-07 MED ORDER — LIDOCAINE 2% (20 MG/ML) 5 ML SYRINGE
INTRAMUSCULAR | Status: AC
  Filled 2016-03-07: qty 5

## 2016-03-07 MED ORDER — SUCCINYLCHOLINE CHLORIDE 20 MG/ML IJ SOLN
INTRAMUSCULAR | Status: DC | PRN
  Administered 2016-03-07: 120 mg via INTRAVENOUS

## 2016-03-07 MED ORDER — POTASSIUM CHLORIDE IN NACL 20-0.9 MEQ/L-% IV SOLN
INTRAVENOUS | Status: DC
  Administered 2016-03-07: 16:00:00 via INTRAVENOUS
  Filled 2016-03-07 (×2): qty 1000

## 2016-03-07 MED ORDER — MIDAZOLAM HCL 2 MG/2ML IJ SOLN
INTRAMUSCULAR | Status: AC
  Filled 2016-03-07: qty 2

## 2016-03-07 MED ORDER — SUGAMMADEX SODIUM 500 MG/5ML IV SOLN
INTRAVENOUS | Status: DC | PRN
  Administered 2016-03-07: 400 mg via INTRAVENOUS

## 2016-03-07 MED ORDER — INSULIN GLARGINE 100 UNIT/ML ~~LOC~~ SOLN
8.0000 [IU] | Freq: Every day | SUBCUTANEOUS | Status: DC
  Administered 2016-03-07: 8 [IU] via SUBCUTANEOUS
  Filled 2016-03-07 (×2): qty 0.08

## 2016-03-07 MED ORDER — ROCURONIUM BROMIDE 100 MG/10ML IV SOLN
INTRAVENOUS | Status: DC | PRN
  Administered 2016-03-07: 10 mg via INTRAVENOUS
  Administered 2016-03-07: 30 mg via INTRAVENOUS

## 2016-03-07 MED ORDER — ONDANSETRON HCL 4 MG/2ML IJ SOLN
INTRAMUSCULAR | Status: DC | PRN
  Administered 2016-03-07: 4 mg via INTRAVENOUS

## 2016-03-07 MED ORDER — PROPOFOL 10 MG/ML IV BOLUS
INTRAVENOUS | Status: DC | PRN
  Administered 2016-03-07: 160 mg via INTRAVENOUS

## 2016-03-07 MED ORDER — CLONAZEPAM 0.5 MG PO TABS
0.2500 mg | ORAL_TABLET | Freq: Every day | ORAL | Status: DC
  Administered 2016-03-07 – 2016-03-08 (×2): 0.25 mg via ORAL
  Filled 2016-03-07 (×2): qty 1

## 2016-03-07 MED ORDER — LACTATED RINGERS IV SOLN
INTRAVENOUS | Status: DC | PRN
  Administered 2016-03-07 (×2): via INTRAVENOUS

## 2016-03-07 MED ORDER — HYDROCHLOROTHIAZIDE 25 MG PO TABS
25.0000 mg | ORAL_TABLET | Freq: Every day | ORAL | Status: DC
Start: 2016-03-07 — End: 2016-03-08
  Administered 2016-03-07 – 2016-03-08 (×2): 25 mg via ORAL
  Filled 2016-03-07 (×2): qty 1

## 2016-03-07 MED ORDER — PNEUMOCOCCAL VAC POLYVALENT 25 MCG/0.5ML IJ INJ
0.5000 mL | INJECTION | INTRAMUSCULAR | Status: DC
  Filled 2016-03-07: qty 0.5

## 2016-03-07 MED ORDER — LIDOCAINE HCL (CARDIAC) 20 MG/ML IV SOLN
INTRAVENOUS | Status: DC | PRN
  Administered 2016-03-07: 80 mg via INTRAVENOUS

## 2016-03-07 MED ORDER — NITROGLYCERIN 0.4 MG SL SUBL: 0.4000 mg | SUBLINGUAL_TABLET | SUBLINGUAL | Status: DC | PRN

## 2016-03-07 MED ORDER — ONDANSETRON HCL 4 MG/2ML IJ SOLN: 4.0000 mg | Freq: Four times a day (QID) | INTRAMUSCULAR | Status: DC | PRN

## 2016-03-07 MED ORDER — LISINOPRIL 40 MG PO TABS
40.0000 mg | ORAL_TABLET | Freq: Every day | ORAL | Status: DC
  Administered 2016-03-07 – 2016-03-08 (×2): 40 mg via ORAL
  Filled 2016-03-07: qty 2
  Filled 2016-03-07 (×2): qty 1
  Filled 2016-03-07: qty 2

## 2016-03-07 MED ORDER — ASPIRIN 81 MG PO CHEW
81.0000 mg | CHEWABLE_TABLET | Freq: Every day | ORAL | Status: DC
  Administered 2016-03-07 – 2016-03-08 (×2): 81 mg via ORAL
  Filled 2016-03-07 (×2): qty 1

## 2016-03-07 MED ORDER — HYDROMORPHONE HCL 2 MG PO TABS: 1.0000 mg | ORAL_TABLET | ORAL | Status: DC | PRN

## 2016-03-07 MED ORDER — ALBUTEROL SULFATE (2.5 MG/3ML) 0.083% IN NEBU: 3.0000 mL | INHALATION_SOLUTION | Freq: Four times a day (QID) | RESPIRATORY_TRACT | Status: DC | PRN

## 2016-03-07 SURGICAL SUPPLY — 37 items
APPLIER CLIP ROT 10 11.4 M/L (STAPLE) ×2
CABLE HIGH FREQUENCY MONO STRZ (ELECTRODE) ×2 IMPLANT
CATH REDDICK CHOLANGI 4FR 50CM (CATHETERS) ×2 IMPLANT
CHLORAPREP W/TINT 26ML (MISCELLANEOUS) ×2 IMPLANT
CLIP APPLIE ROT 10 11.4 M/L (STAPLE) ×1 IMPLANT
COVER MAYO STAND STRL (DRAPES) ×2 IMPLANT
COVER SURGICAL LIGHT HANDLE (MISCELLANEOUS) ×2 IMPLANT
DECANTER SPIKE VIAL GLASS SM (MISCELLANEOUS) ×2 IMPLANT
DERMABOND ADVANCED (GAUZE/BANDAGES/DRESSINGS) ×1
DERMABOND ADVANCED .7 DNX12 (GAUZE/BANDAGES/DRESSINGS) ×1 IMPLANT
DRAPE C-ARM 42X120 X-RAY (DRAPES) ×2 IMPLANT
ELECT REM PT RETURN 9FT ADLT (ELECTROSURGICAL) ×2
ELECTRODE REM PT RTRN 9FT ADLT (ELECTROSURGICAL) ×1 IMPLANT
GLOVE BIOGEL PI IND STRL 7.5 (GLOVE) ×1 IMPLANT
GLOVE BIOGEL PI INDICATOR 7.5 (GLOVE) ×1
GLOVE ECLIPSE 7.5 STRL STRAW (GLOVE) ×2 IMPLANT
GOWN STRL REUS W/TWL XL LVL3 (GOWN DISPOSABLE) ×8 IMPLANT
HEMOSTAT SNOW SURGICEL 2X4 (HEMOSTASIS) IMPLANT
HEMOSTAT SURGICEL 4X8 (HEMOSTASIS) IMPLANT
IRRIG SUCT STRYKERFLOW 2 WTIP (MISCELLANEOUS) ×2
IRRIGATION SUCT STRKRFLW 2 WTP (MISCELLANEOUS) ×1 IMPLANT
KIT BASIN OR (CUSTOM PROCEDURE TRAY) ×2 IMPLANT
POSITIONER SURGICAL ARM (MISCELLANEOUS) IMPLANT
POUCH SPECIMEN RETRIEVAL 10MM (ENDOMECHANICALS) ×2 IMPLANT
SCISSORS LAP 5X35 DISP (ENDOMECHANICALS) ×2 IMPLANT
SET CHOLANGIOGRAPH MIX (MISCELLANEOUS) ×2 IMPLANT
SET IRRIG TUBING LAPAROSCOPIC (IRRIGATION / IRRIGATOR) ×2 IMPLANT
SLEEVE XCEL OPT CAN 5 100 (ENDOMECHANICALS) ×2 IMPLANT
SUT MNCRL AB 4-0 PS2 18 (SUTURE) ×2 IMPLANT
TAPE CLOTH 4X10 WHT NS (GAUZE/BANDAGES/DRESSINGS) IMPLANT
TOWEL OR 17X26 10 PK STRL BLUE (TOWEL DISPOSABLE) ×2 IMPLANT
TOWEL OR NON WOVEN STRL DISP B (DISPOSABLE) ×2 IMPLANT
TRAY LAPAROSCOPIC (CUSTOM PROCEDURE TRAY) ×2 IMPLANT
TROCAR BLADELESS OPT 5 100 (ENDOMECHANICALS) ×2 IMPLANT
TROCAR XCEL BLUNT TIP 100MML (ENDOMECHANICALS) ×2 IMPLANT
TROCAR XCEL NON-BLD 11X100MML (ENDOMECHANICALS) ×2 IMPLANT
TUBING INSUF HEATED (TUBING) ×2 IMPLANT

## 2016-03-07 NOTE — Interval H&P Note (Signed)
History and Physical Interval Note:  03/07/2016 9:54 AM  Dwayne [name]  has presented today for surgery, with the diagnosis of CHOLELITHIASIS  The various methods of treatment have been discussed with the patient and family. After consideration of risks, benefits and other options for treatment, the patient has consented to  Procedure(s): LAPAROSCOPIC CHOLECYSTECTOMY WITH INTRAOPERATIVE CHOLANGIOGRAM (N/A) as a surgical intervention .  The patient's history has been reviewed, patient examined, no change in status, stable for surgery.  I have reviewed the patient's chart and labs.  Questions were answered to the patient's satisfaction.     [name]

## 2016-03-07 NOTE — Transfer of Care (Signed)
Immediate Anesthesia Transfer of Care Note  Patient: Dwayne [name]  Procedure(s) Performed: Procedure(s): LAPAROSCOPIC CHOLECYSTECTOMY WITH INTRAOPERATIVE CHOLANGIOGRAM (N/A)  Patient Location: PACU  Anesthesia Type:General  Level of Consciousness: awake, oriented, patient cooperative, lethargic and responds to stimulation  Airway & Oxygen Therapy: Patient Spontanous Breathing and Patient connected to face mask oxygen  Post-op Assessment: Report given to RN, Post -op Vital signs reviewed and stable and Patient moving all extremities  Post vital signs: Reviewed and stable  Last Vitals:  Vitals:   03/07/16 0853  BP: 129/64  Pulse: 77  Resp: 18  Temp: 36.7 C    Last Pain:  Vitals:   03/07/16 0853  TempSrc: Oral      Patients Stated Pain Goal: 3 (03/07/16 0858)  Complications: No apparent anesthesia complications

## 2016-03-07 NOTE — Anesthesia Postprocedure Evaluation (Signed)
Anesthesia Post Note  Patient: Olena MaterDennis Borquez  Procedure(s) Performed: Procedure(s) (LRB): LAPAROSCOPIC CHOLECYSTECTOMY WITH INTRAOPERATIVE CHOLANGIOGRAM (N/A)  Patient location during evaluation: PACU Anesthesia Type: General Level of consciousness: awake and alert Pain management: pain level controlled Vital Signs Assessment: post-procedure vital signs reviewed and stable Respiratory status: spontaneous breathing, nonlabored ventilation, respiratory function stable and patient connected to nasal cannula oxygen Cardiovascular status: blood pressure returned to baseline and stable Postop Assessment: no signs of nausea or vomiting Anesthetic complications: no    Last Vitals:  Vitals:   03/07/16 1230 03/07/16 1241  BP:  140/77  Pulse:  85  Resp: 14 14  Temp:  36.5 C    Last Pain:  Vitals:   03/07/16 1241  TempSrc:   PainSc: 2                  Safiatou Islam,W. EDMOND

## 2016-03-07 NOTE — Op Note (Signed)
Preoperative diagnosis: Cholelithiasis and cholecystitis  Postoperative diagnosis: Cholelithiasis and cholecystitis  Surgical procedure: Laparoscopic cholecystectomy with intraoperative cholangiogram  Surgeon: Sharlet SalinaBenjamin T. Lourie Retz M.D.  Assistant: None  Anesthesia: General Endotracheal  Complications: None  Estimated blood loss: Minimal  Description of procedure: The patient brought to the operating room, placed in the supine position on the operating table, and general endotracheal anesthesia induced. The abdomen was widely sterilely prepped and draped. The patient had received preoperative IV antibiotics and PAS were in place. Patient timeout was performed the correct procedure verified. Standard 4 port technique was used with an open Hassan cannula at the umbilicus and the remainder of the ports placed under direct vision. The gallbladder was visualized. It appeared thickened and chronically inflamed. The fundus was grasped and elevated up over the liver and the infundibulum retracted inferiolaterally. Peritoneum anterior and posterior to close triangle was incised and fibrofatty tissue stripped off the neck of the gallbladder toward the porta hepatis. The distal gallbladder was thoroughly dissected. The cystic artery was identified in close triangle and the cystic duct gallbladder junction dissected 360.  A good critical view was obtained. When the anatomy was clear the cystic duct was clipped at the gallbladder junction and an operative cholangiogram obtained through the cystic duct. This showed good filling of a normal common bile duct and intrahepatic ducts with free flow into the duodenum and no filling defects. Following this the Cholangiocath was removed and the cystic duct was doubly clipped proximally and divided. The cystic artery was doubly clipped proximally and distally and divided. The gallbladder was dissected free from its bed using hook cautery and removed through the umbilical  port site. Complete hemostasis was obtained in the gallbladder bed. The right upper quadrant was thoroughly irrigated and hemostasis assured. Trochars were removed and all CO2 evacuated and the [facility] trocar site fascial defect closed. Skin incisions were closed with subcuticular Monocryl and Dermabond. Sponge needle and instrument counts were correct. The patient was taken to PACU in good condition.  Shalece Staffa T  03/07/2016

## 2016-03-07 NOTE — H&P (Signed)
History of Present Illness Dwayne Dwayne [name](Dwayne Napoleon T. Judea Riches MD; 02/09/2016 4:43 PM) The patient is a 70 year old male who presents for evaluation of gall stones. He is a 70 year old male, retired originally from [place] Dwayne [name], referred by Dr. Vilinda BoehringerJim Dwayne [name] for recurrent abdominal pain and gallstones. The patient states that for at least 3 months he has had gradually worsening episodic abdominal discomfort. He describes a pressure-like discomfort which sometimes is generalized mid abdomen but also occasionally migrates into his upper abdomen and right upper quadrant or lower chest. No associated fever or chills. Occasional nausea. Sometimes food makes the pain worse but occasionally seems to help. It has occasionally been severe and he did present to the emergency room on one occasion with severe epigastric and chest pain and cardiac issues were ruled out. He has seen Dr. Randa EvensEdwards in the past for dysphagia which was evaluated with a negative workup. With this Dwayne pain and gallbladder ultrasound was obtained. This shows multiple gallstones with no gallbladder wall thickening, and benign-appearing cyst in the left kidney. Liver appeared fatty infiltrated. Patient has multiple medical problems including coronary artery disease, diabetes, obesity and continued tobacco use. He does have chronic intermittent constipation.   Other Problems Dwayne Dwayne [name](Dwayne Dwayne [name], [name]; 02/09/2016 4:06 PM) Anxiety Disorder Arthritis Back Pain Bladder Problems Chest pain Chronic Obstructive Lung Disease Congestive Heart Failure Diabetes Mellitus Gastroesophageal Reflux Disease High blood pressure  Past Surgical History Dwayne Dwayne [name](Dwayne Dwayne [name], [name]; 02/09/2016 4:06 PM) Cataract Surgery Bilateral.  Diagnostic Studies History Dwayne Dwayne [name](Dwayne Dwayne [name], [name]; 02/09/2016 4:06 PM) Colonoscopy >10 years ago  Allergies Dwayne Dwayne [name](Dwayne Dwayne [name], [name]; 02/09/2016 4:06 PM) Morphine Sulfate *ANALGESICS - OPIOID*  Medication History Dwayne Dwayne [name](Dwayne Dwayne [name], [name];  02/09/2016 4:09 PM) Metoprolol Succinate ER (25MG  Tablet ER 24HR, Oral) Active. Isosorbide Mononitrate ER (30MG  Tablet ER 24HR, Oral) Active. Nitroglycerin (0.4MG  Tab Sublingual, Sublingual) Active. Furosemide (20MG  Tablet, Oral) Active. Lantus SoloStar (100UNIT/ML Soln Pen-inj, Subcutaneous 14units at bedtime) Active. Clopidogrel Bisulfate (75MG  Tablet, Oral) Active. Isosorbide Mononitrate (30MG  Tablet ER, Oral) Active. Aspirin (81MG  Tablet, Oral) Active. MetFORMIN HCl (1000MG  Tablet, Oral) Active. Pantoprazole Sodium (40MG  Tablet DR, Oral) Active. ClonazePAM (0.5MG  Tablet, Oral two times daily) Active. ProAir HFA (108 (90 Base)MCG/ACT Aerosol Soln, Inhalation) Active. GlipiZIDE XL (10MG  Tablet ER 24HR, Oral two times daily) Active. Atorvastatin Calcium (80MG  Tablet, Oral) Active. Sertraline HCl (100MG  Tablet, Oral) Active. Lisinopril (40MG  Tablet, Oral) Active. Medications Reconciled  Social History Dwayne Dwayne [name](Dwayne Dwayne [name], Dwayne Dwayne [name]; 02/09/2016 4:06 PM) Alcohol use Remotely quit alcohol use. Caffeine use Carbonated beverages, Coffee. No drug use Tobacco use Current every day smoker.  Family History Dwayne Dwayne [name](Dwayne Dwayne [name], Dwayne Dwayne [name]; 02/09/2016 4:06 PM) Breast Cancer Mother. Diabetes Mellitus Father. Heart Disease Father. Heart disease in male family member before age 70 Hypertension Father. Seizure disorder Daughter.    Review of Systems Dwayne Dwayne [name](Dwayne Dwayne [name]; 02/09/2016 4:06 PM) General Present- Fatigue. Not Present- Appetite Loss, Chills, Fever, Night Sweats, Weight Gain and Weight Loss. Skin Present- Dryness. Not Present- Change in Wart/Mole, Hives, Jaundice, Dwayne Lesions, Non-Healing Wounds, Rash and Ulcer. HEENT Present- Visual Disturbances. Not Present- Earache, Hearing Loss, Hoarseness, Nose Bleed, Oral Ulcers, Ringing in the Ears, Seasonal Allergies, Sinus Pain, Sore Throat, Wears glasses/contact lenses and Yellow Eyes. Respiratory Present- Wheezing. Not Present- Bloody sputum,  Chronic Cough, Difficulty Breathing and Snoring. Breast Not Present- Breast Mass, Breast Pain, Nipple Discharge and Skin Changes. Cardiovascular Present- Swelling of Extremities. Not Present- Chest Pain, Difficulty Breathing Lying Down, Leg Cramps, Palpitations, Rapid Heart Rate and Shortness of Breath. Gastrointestinal Present- Abdominal Pain, Change in Bowel Habits, Constipation, Difficulty  Swallowing and Indigestion. Not Present- Bloating, Bloody Stool, Chronic diarrhea, Excessive gas, Gets full quickly at meals, Hemorrhoids, Nausea, Rectal Pain and Vomiting. Male Genitourinary Present- Nocturia and Urine Leakage. Not Present- Blood in Urine, Change in Urinary Stream, Frequency, Impotence, Painful Urination and Urgency. Musculoskeletal Present- Back Pain, Joint Pain, Joint Stiffness and Muscle Weakness. Not Present- Muscle Pain and Swelling of Extremities. Neurological Present- Tingling. Not Present- Decreased Memory, Fainting, Headaches, Numbness, Seizures, Tremor, Trouble walking and Weakness. Psychiatric Present- Anxiety. Not Present- Bipolar, Change in Sleep Pattern, Depression, Fearful and Frequent crying. Endocrine Not Present- Cold Intolerance, Excessive Hunger, Hair Changes, Heat Intolerance, Hot flashes and Dwayne Diabetes. Hematology Present- Blood Thinners and Easy Bruising. Not Present- Excessive bleeding, Gland problems, HIV and Persistent Infections.  Vitals Dwayne Dwayne [name] [name]; 02/09/2016 4:10 PM) 02/09/2016 4:09 PM Weight: 288 lb Height: 69.5in Body Surface Area: 2.42 m Body Mass Index: 41.92 kg/m  Temp.: 98.48F(Temporal)  Pulse: 84 (Regular)  BP: 138/82 (Sitting, Left Arm, Standard)       Physical Exam Dwayne Dwayne [name] [name] MD; 02/09/2016 4:46 PM) The physical exam findings are as follows: Note:General: Alert, obese Caucasian male, in no distress Skin: Warm and dry without rash or infection. HEENT: No palpable masses or thyromegaly. Sclera nonicteric. Pupils  equal round and reactive. Lymph nodes: No cervical, supraclavicular, or inguinal nodes palpable. Lungs: Breath sounds clear and equal. No wheezing or increased work of breathing. Cardiovascular: Regular rate and rhythm without murmer. 1-2+ lower extremity edema. Pedal pulses not palpable likely secondary to edema Abdomen: Obese. Nondistended. Soft with mild tenderness in the right upper quadrant to deep palpation. No masses palpable. No organomegaly. No palpable hernias. Extremities: 1-2+ bilateral lower extremity edema. No chronic venous stasis changes. Neurologic: Alert and fully oriented. Gait difficult secondary to joint pain. No focal weakness. Psychiatric: Normal mood and affect. Thought content appropriate with normal judgement and insight    Assessment & Plan Dwayne Dwayne [name] T. 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 Gallbladder ultrasound has shown multiple gallstones. I think he has recurrent biliary colic and chronic cholecystitis. He does have significant associated medical problems including obesity, coronary artery disease, diabetes, and ongoing cigarette use. We discussed he would be at some increased risk for surgical intervention but I think he is quite symptomatic and also at risk for developing acute cholecystitis. Overall I think he would benefit from laparoscopic cholecystectomy and that the benefit outweighs the risks. I discussed the procedure in detail. The patient was given Agricultural engineer. We discussed the risks and benefits of a laparoscopic cholecystectomy and possible cholangiogram including, but not limited to, bleeding, infection, injury to surrounding structures such as the intestine or liver, bile leak, retained gallstones, need to convert to an open procedure, prolonged  diarrhea, blood clots such as DVT, common bile duct injury, anesthesia risks, and possible need for additional procedures. The likelihood of improvement in symptoms and return to the patient's normal status is good. We discussed the typical post-operative recovery course. All questions were answered. We will obtain preoperative cardiac clearance. Current Plans I recommended obtaining preoperative cardiac clearance. I am concerned about the health of the patient and the ability to tolerate the operation. Therefore, we will request clearance by cardiology to better assess operative risk & see if a reevaluation, further workup, etc is needed. Also recommendations on how medications such as for anticoagulation and blood  pressure should be managed/held/restarted after surgery. Pt Education - Pamphlet Given - Laparoscopic Gallbladder Surgery: discussed with patient and provided information. Laparoscopic cholecystectomy with intraoperative cholangiogram under general anesthesia with overnight hospitalization

## 2016-03-07 NOTE — Anesthesia Preprocedure Evaluation (Addendum)
Anesthesia Evaluation  Patient identified by MRN, date of birth, ID band Patient awake    Reviewed: Allergy & Precautions, H&P , NPO status , Patient's Chart, lab work & pertinent test results, reviewed documented beta blocker date and time   Airway Mallampati: III  TM Distance: >3 FB Neck ROM: Full    Dental no notable dental hx. (+) Teeth Intact, Dental Advisory Given   Pulmonary COPD,  COPD inhaler, Current Smoker,    Pulmonary exam normal breath sounds clear to auscultation       Cardiovascular hypertension, Pt. on medications and Pt. on home beta blockers + CAD and + Cardiac Stents   Rhythm:Regular Rate:Normal     Neuro/Psych Anxiety Depression negative neurological ROS     GI/Hepatic Neg liver ROS, GERD  Medicated and Controlled,  Endo/Other  diabetes, Insulin Dependent, Oral Hypoglycemic AgentsMorbid obesity  Renal/GU negative Renal ROS  negative genitourinary   Musculoskeletal  (+) Arthritis , Osteoarthritis,    Abdominal   Peds  Hematology negative hematology ROS (+)   Anesthesia Other Findings   Reproductive/Obstetrics negative OB ROS                            Anesthesia Physical Anesthesia Plan  ASA: III  Anesthesia Plan: General   Post-op Pain Management:    Induction: Intravenous  Airway Management Planned: Oral ETT  Additional Equipment:   Intra-op Plan:   Post-operative Plan: Extubation in OR  Informed Consent: I have reviewed the patients History and Physical, chart, labs and discussed the procedure including the risks, benefits and alternatives for the proposed anesthesia with the patient or authorized representative who has indicated his/her understanding and acceptance.   Dental advisory given  Plan Discussed with: CRNA  Anesthesia Plan Comments:         Anesthesia Quick Evaluation

## 2016-03-07 NOTE — Anesthesia Procedure Notes (Signed)
Procedure Name: Intubation Date/Time: 03/07/2016 10:30 AM Performed by: Gaynelle AduFITZGERALD, WILLIAM Pre-anesthesia Checklist: Patient identified, Emergency Drugs available, Suction available, Patient being monitored and Timeout performed Patient Re-evaluated:Patient Re-evaluated prior to inductionOxygen Delivery Method: Circle system utilized Preoxygenation: Pre-oxygenation with 100% oxygen Intubation Type: IV induction and Cricoid Pressure applied Ventilation: Mask ventilation without difficulty Laryngoscope Size: Mac and 4 Grade View: Grade II Tube type: Oral Tube size: 7.5 mm Number of attempts: 1 Airway Equipment and Method: Stylet Placement Confirmation: ETT inserted through vocal cords under direct vision,  positive ETCO2 and breath sounds checked- equal and bilateral Secured at: 21 cm Tube secured with: Tape Dental Injury: Teeth and Oropharynx as per pre-operative assessment

## 2016-03-08 DIAGNOSIS — K8010 Calculus of gallbladder with chronic cholecystitis without obstruction: Secondary | ICD-10-CM | POA: Diagnosis not present

## 2016-03-08 LAB — CBC
HCT: 39.2 % (ref 39.0–52.0)
Hemoglobin: 13.5 g/dL (ref 13.0–17.0)
MCH: 33.3 pg (ref 26.0–34.0)
MCHC: 34.4 g/dL (ref 30.0–36.0)
MCV: 96.8 fL (ref 78.0–100.0)
PLATELETS: 256 10*3/uL (ref 150–400)
RBC: 4.05 MIL/uL — ABNORMAL LOW (ref 4.22–5.81)
RDW: 13.9 % (ref 11.5–15.5)
WBC: 22.4 10*3/uL — ABNORMAL HIGH (ref 4.0–10.5)

## 2016-03-08 LAB — BASIC METABOLIC PANEL
Anion gap: 7 (ref 5–15)
BUN: 17 mg/dL (ref 6–20)
CALCIUM: 8.8 mg/dL — AB (ref 8.9–10.3)
CO2: 29 mmol/L (ref 22–32)
CREATININE: 0.89 mg/dL (ref 0.61–1.24)
Chloride: 97 mmol/L — ABNORMAL LOW (ref 101–111)
GFR calc Af Amer: >60 mL/min (ref >60)
GFR calc non Af Amer: >60 mL/min (ref >60)
GLUCOSE: 236 mg/dL — AB (ref 65–99)
Potassium: 3.6 mmol/L (ref 3.5–5.1)
Sodium: 133 mmol/L — ABNORMAL LOW (ref 135–145)

## 2016-03-08 LAB — GLUCOSE, CAPILLARY
GLUCOSE-CAPILLARY: 183 mg/dL — AB (ref 65–99)
Glucose-Capillary: 169 mg/dL — ABNORMAL HIGH (ref 65–99)

## 2016-03-08 MED ORDER — OXYCODONE HCL 5 MG PO TABS: 5.0000 mg | ORAL_TABLET | ORAL | 0 refills | Status: DC | PRN

## 2016-03-08 NOTE — Discharge Instructions (Signed)
[facility] ______[facility] °LAPAROSCOPIC SURGERY: POST OP INSTRUCTIONS °Always review your discharge instruction sheet given to you by the facility where your surgery was performed. °IF YOU HAVE DISABILITY OR FAMILY LEAVE FORMS, YOU MUST BRING THEM TO THE OFFICE FOR PROCESSING.   °DO NOT GIVE THEM TO YOUR DOCTOR. ° °1. A prescription for pain medication may be given to you upon discharge.  Take your pain medication as prescribed, if needed.  If narcotic pain medicine is not needed, then you may take acetaminophen (Tylenol) or ibuprofen (Advil) as needed. °2. Take your usually prescribed medications unless otherwise directed. °3. If you need a refill on your pain medication, please contact your pharmacy.  They will contact our office to request authorization. Prescriptions will not be filled after 5pm or on week-ends. °4. You should follow a light diet the first few days after arrival home, such as soup and crackers, etc.  Be sure to include lots of fluids daily. °5. Most patients will experience some swelling and bruising in the area of the incisions.  Ice packs will help.  Swelling and bruising can take several days to resolve.  °6. It is common to experience some constipation if taking pain medication after surgery.  Increasing fluid intake and taking a stool softener (such as Colace) will usually help or prevent this problem from occurring.  A mild laxative (Milk of Magnesia or Miralax) should be taken according to package instructions if there are no bowel movements after 48 hours. °7. Unless discharge instructions indicate otherwise, you may remove your bandages 24-48 hours after surgery, and you may shower at that time.  You may have steri-strips (small skin tapes) in place directly over the incision.  These strips should be left on the skin for 7-10 days.  If your surgeon used skin glue on the incision, you may shower in 24 hours.  The glue will flake off over the next 2-3 weeks.  Any sutures or  staples will be removed at the office during your follow-up visit. °8. ACTIVITIES:  You may resume regular (light) daily activities beginning the next day--such as daily self-care, walking, climbing stairs--gradually increasing activities as tolerated.  You may have sexual intercourse when it is comfortable.  Refrain from any heavy lifting or straining until approved by your doctor. °a. You may drive when you are no longer taking prescription pain medication, you can comfortably wear a seatbelt, and you can safely maneuver your car and apply brakes. °b. RETURN TO WORK:  __________________________________________________________ °9. You should see your doctor in the office for a follow-up appointment approximately 2-3 weeks after your surgery.  Make sure that you call for this appointment within a day or two after you arrive home to insure a convenient appointment time. °10. OTHER INSTRUCTIONS: __________________________________________________________________________________________________________________________ __________________________________________________________________________________________________________________________ °WHEN TO CALL YOUR DOCTOR: °1. Fever over 101.0 °2. Inability to urinate °3. Continued bleeding from incision. °4. Increased pain, redness, or drainage from the incision. °5. Increasing abdominal pain ° °The clinic staff is available to answer your questions during regular business hours.  Please don’t hesitate to call and ask to speak to one of the nurses for clinical concerns.  If you have a medical emergency, go to the nearest emergency room or call 911.  A surgeon from [facility] is always on call at the hospital. °[address], [address], [place], [place]  [address] ? [address] [address], [place], [place]   [address] °[phone] ? [phone] ? FAX [phone] °Web site:   [email or web address] °

## 2016-03-08 NOTE — Progress Notes (Signed)
Pt and wife were given prescriptions and discharge instructions, all questions were answered and patient was taken to main entrance via wheelchair by nurse tech. Malana Eberwein R McClean

## 2016-03-08 NOTE — Discharge Summary (Signed)
Patient ID: Dwayne [name] [id number] 70 y.o. 09/26/1945  03/07/2016  Discharge date and time: 03/08/2016   Admitting Physician: Glenna FellowsHOXWORTH,Ashby Leflore T  Discharge Physician: Glenna FellowsHOXWORTH,Erikka Follmer T  Admission Diagnoses: CHOLELITHIASIS and chronic cholecystitis  Discharge Diagnoses: Same  Operations: Procedure(s): LAPAROSCOPIC CHOLECYSTECTOMY WITH INTRAOPERATIVE CHOLANGIOGRAM  Admission Condition: fair  Discharged Condition: good  Indication for Admission: He is a 70 year old male with multiple medical problems who presents with intermittent epigastric and right upper quadrant abdominal pain which occasionally has been severe. Symptoms are gradually worsening. Workup has included a gallbladder ultrasound showing multiple gallstones. He is felt to have symptomatic cholelithiasis and after discussion in the office regarding potential benefits and risks of surgery detailed elsewhere we have elected to proceed with laparoscopic cholecystectomy with cholangiogram in an effort to relieve his symptoms and prevent complications from his gallstones.  Hospital Course: On the morning of admission he underwent an uneventful laparoscopic cholecystectomy with normal intraoperative cholangiogram. The gallbladder did appear chronically inflamed. His postoperative course was unremarkable. On the first postoperative morning he has some mild discomfort that is continuing to improve. No nausea. Tolerating diet. Getting up to the bathroom. He does have leukocytosis of 22,000 on his CBC within normal hemoglobin but nothing by history or physical exam to suggest a problem. Abdomen is soft and nontender and wounds are healing well. He is felt stable for discharge.    Disposition: Home  Patient Instructions:    Medication List    TAKE these medications   acetaminophen 500 MG tablet Commonly known as:  TYLENOL Take 500 mg by mouth every 6 (six) hours as needed for mild pain.   albuterol 108 (90 Base) MCG/ACT  inhaler Commonly known as:  PROVENTIL HFA;VENTOLIN HFA Inhale 2 puffs into the lungs every 6 (six) hours as needed for wheezing.   aspirin 81 MG chewable tablet Chew 1 tablet (81 mg total) by mouth daily.   atorvastatin 80 MG tablet Commonly known as:  LIPITOR Take 80 mg by mouth daily at 6 PM.   clonazePAM 0.5 MG tablet Commonly known as:  KLONOPIN Take 0.25 mg by mouth daily.   clopidogrel 75 MG tablet Commonly known as:  PLAVIX TAKE ONE TABLET BY MOUTH ONCE DAILY   furosemide 20 MG tablet Commonly known as:  LASIX Take 1 tablet (20 mg total) by mouth daily.   GLIPIZIDE XL 10 MG 24 hr tablet Generic drug:  glipiZIDE Take 1 tablet by mouth 2 (two) times daily.   hydrochlorothiazide 25 MG tablet Commonly known as:  HYDRODIURIL Take 25 mg by mouth daily.   insulin glargine 100 unit/mL Sopn Commonly known as:  LANTUS Inject 16 Units into the skin at bedtime.   isosorbide mononitrate 30 MG 24 hr tablet Commonly known as:  IMDUR TAKE ONE TABLET BY MOUTH ONCE DAILY What changed:  See the new instructions.   lisinopril 40 MG tablet Commonly known as:  PRINIVIL,ZESTRIL Take 40 mg by mouth daily.   metFORMIN 1000 MG tablet Commonly known as:  GLUCOPHAGE Take 1 tablet (1,000 mg total) by mouth 2 (two) times daily with a meal.   metoprolol succinate 25 MG 24 hr tablet Commonly known as:  TOPROL XL Take 1 tablet (25 mg total) by mouth daily.   nitroGLYCERIN 0.4 MG SL tablet Commonly known as:  NITROSTAT Place 0.4 mg under the tongue every 5 (five) minutes as needed for chest pain (x 3 doses).   oxyCODONE 5 MG immediate release tablet Commonly known as:  Oxy IR/ROXICODONE Take 1-2 tablets (5-10  mg total) by mouth every 4 (four) hours as needed for moderate pain.   pantoprazole 40 MG tablet Commonly known as:  PROTONIX Take 1 tablet (40 mg total) by mouth daily.   PROBIOTIC PO Take 1 tablet by mouth daily.   sertraline 100 MG tablet Commonly known as:   ZOLOFT Take 200 mg by mouth at bedtime.       Activity: activity as tolerated Diet: diabetic diet Wound Care: none needed  Follow-up:  With Dr. Johna SheriffHoxworth in 3 weeks.  Signed: Mariella SaaBenjamin T Briell Paulette MD, FACS  03/08/2016, 9:36 AM

## 2016-03-10 ENCOUNTER — Encounter (HOSPITAL_COMMUNITY): Payer: Self-pay | Admitting: General Surgery

## 2016-05-06 ENCOUNTER — Other Ambulatory Visit: Payer: Self-pay | Admitting: Cardiology

## 2016-06-23 ENCOUNTER — Telehealth: Payer: Self-pay | Admitting: *Deleted

## 2016-06-23 MED ORDER — METOPROLOL SUCCINATE ER 25 MG PO TB24: 25.0000 mg | ORAL_TABLET | Freq: Every day | ORAL | 2 refills | Status: DC

## 2016-06-23 NOTE — Telephone Encounter (Signed)
Notified the pt that per Dr Delton SeeNelson, its ok with her if he increased his Toprol XL to 25 mg po daily.  Confirmed the pharmacy of choice with the pt.  Pt requested for a 90 day supply to be sent to his pharmacy of choice.  Pt verbalized understanding and agrees with this plan.

## 2016-06-23 NOTE — Telephone Encounter (Signed)
Will endorse this information to Dr Delton SeeNelson, to make her aware that the pt is dosing his Toprol xl back to 25 mg po daily, vs original order to take 12.5 mg po daily.  Will have Dr Delton SeeNelson clarify or provide a new order for the pt to increase his Toprol xl to 25 mg po daily.  Will follow-up with the pt accordingly.

## 2016-06-23 NOTE — Telephone Encounter (Signed)
Patient has increased his dose back to 25 mg qd on his own.

## 2016-06-23 NOTE — Telephone Encounter (Signed)
That's ok with me.

## 2016-06-23 NOTE — Telephone Encounter (Signed)
Patient called and stated that the metoprolol should be one whole tablet qd but the rx was sent in for one half tablet qd. He stated that he spoke with a nurse here previously and made her aware that he was taking one whole tablet qd but I do not see any documentation of this. Please advise. Thanks, MI

## 2016-06-23 NOTE — Telephone Encounter (Signed)
BELOW IS DR NELSON'S NOTE WITH THIS PT ON WHY SHE DECREASED HIS TOPROL XL TO 12.5 MG PO DAILY.       Assessment & Plan  71 year old male  1. CAD, S/P multiple stenting including RCA, diagonal, most recently obtained a stent to proximal RCA to her severe in-stent rest stenosis on 02/12/2014, currently on dual antiplatelet therapy with ASA and plavix, continue BB, ACE, statin, Imdur 30 mg daily. Bruising, encouraged to use ASA/Plavix DAPT at least till October.  2. 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 The last EKG in April 2017 shows only first-degree AV block, his dizziness is only orthostatic, will decrease metoprolol to 12.5 mg by mouth daily.   3. Acute on chronic diastolic CHF - restart lasix 20 mg po daily, d/c HCTZ 25 mg po daily  4. IDDM -  poorly controlled, HbA1c 7.9 from 6.3 the last year, poor eating habits, encouraged to change.   5. Hypertension - well controlled, sometimes low, and fatigue. We will decrease the dose of Toprol-XL to 12.5 mg daily.  6. Hyperlipidemia - well controlled on atorvastatin 80 mh QHS, last values in 08/2012 LDL 45, HDL 45, TAG 119, recheck in 6 months  7. Smoking and depression - started on Chantix , continues to smoke, he is smoking today on albuterol PRN only, we will start Spiriva.  Follow up in 3 months.  Tobias AlexanderKatarina Nelson, MD 11/03/2015, 2:01 PM

## 2016-07-14 ENCOUNTER — Observation Stay (HOSPITAL_COMMUNITY)
Admission: EM | Admit: 2016-07-14 | Discharge: 2016-07-14 | Disposition: A | Discharge status: Home / Self Care | Payer: Medicare Other | Attending: Cardiology | Admitting: Cardiology

## 2016-07-14 ENCOUNTER — Ambulatory Visit (INDEPENDENT_AMBULATORY_CARE_PROVIDER_SITE_OTHER): Payer: Medicare Other | Admitting: Cardiology

## 2016-07-14 ENCOUNTER — Emergency Department (HOSPITAL_COMMUNITY): Payer: Medicare Other

## 2016-07-14 ENCOUNTER — Other Ambulatory Visit: Payer: Self-pay

## 2016-07-14 ENCOUNTER — Encounter (HOSPITAL_COMMUNITY): Payer: Self-pay | Admitting: Emergency Medicine

## 2016-07-14 ENCOUNTER — Encounter: Payer: Self-pay | Admitting: Cardiology

## 2016-07-14 VITALS — BP 126/74 | HR 48 | Ht 70.0 in | Wt 287.0 lb

## 2016-07-14 DIAGNOSIS — Z7902 Long term (current) use of antithrombotics/antiplatelets: Secondary | ICD-10-CM | POA: Insufficient documentation

## 2016-07-14 DIAGNOSIS — R001 Bradycardia, unspecified: Secondary | ICD-10-CM

## 2016-07-14 DIAGNOSIS — I5043 Acute on chronic combined systolic (congestive) and diastolic (congestive) heart failure: Secondary | ICD-10-CM

## 2016-07-14 DIAGNOSIS — I10 Essential (primary) hypertension: Secondary | ICD-10-CM | POA: Insufficient documentation

## 2016-07-14 DIAGNOSIS — Z79899 Other long term (current) drug therapy: Secondary | ICD-10-CM | POA: Insufficient documentation

## 2016-07-14 DIAGNOSIS — J449 Chronic obstructive pulmonary disease, unspecified: Secondary | ICD-10-CM | POA: Diagnosis not present

## 2016-07-14 DIAGNOSIS — R42 Dizziness and giddiness: Secondary | ICD-10-CM | POA: Insufficient documentation

## 2016-07-14 DIAGNOSIS — F17210 Nicotine dependence, cigarettes, uncomplicated: Secondary | ICD-10-CM | POA: Insufficient documentation

## 2016-07-14 DIAGNOSIS — I2583 Coronary atherosclerosis due to lipid rich plaque: Secondary | ICD-10-CM | POA: Diagnosis not present

## 2016-07-14 DIAGNOSIS — E119 Type 2 diabetes mellitus without complications: Secondary | ICD-10-CM | POA: Diagnosis not present

## 2016-07-14 DIAGNOSIS — Z951 Presence of aortocoronary bypass graft: Secondary | ICD-10-CM | POA: Diagnosis not present

## 2016-07-14 DIAGNOSIS — I2510 Atherosclerotic heart disease of native coronary artery without angina pectoris: Secondary | ICD-10-CM | POA: Insufficient documentation

## 2016-07-14 DIAGNOSIS — I441 Atrioventricular block, second degree: Secondary | ICD-10-CM | POA: Diagnosis not present

## 2016-07-14 DIAGNOSIS — Z7982 Long term (current) use of aspirin: Secondary | ICD-10-CM | POA: Diagnosis not present

## 2016-07-14 DIAGNOSIS — I252 Old myocardial infarction: Secondary | ICD-10-CM | POA: Diagnosis not present

## 2016-07-14 DIAGNOSIS — R0602 Shortness of breath: Secondary | ICD-10-CM | POA: Diagnosis present

## 2016-07-14 DIAGNOSIS — I130 Hypertensive heart and chronic kidney disease with heart failure and stage 1 through stage 4 chronic kidney disease, or unspecified chronic kidney disease: Secondary | ICD-10-CM | POA: Diagnosis not present

## 2016-07-14 DIAGNOSIS — Z7984 Long term (current) use of oral hypoglycemic drugs: Secondary | ICD-10-CM | POA: Diagnosis not present

## 2016-07-14 LAB — TSH: TSH: 1.62 u[IU]/mL (ref 0.350–4.500)

## 2016-07-14 LAB — CBG MONITORING, ED: Glucose-Capillary: 172 mg/dL — ABNORMAL HIGH (ref 65–99)

## 2016-07-14 LAB — BASIC METABOLIC PANEL
Anion gap: 8 (ref 5–15)
BUN: 21 mg/dL — ABNORMAL HIGH (ref 6–20)
CHLORIDE: 98 mmol/L — AB (ref 101–111)
CO2: 29 mmol/L (ref 22–32)
CREATININE: 1.02 mg/dL (ref 0.61–1.24)
Calcium: 9.2 mg/dL (ref 8.9–10.3)
GFR calc non Af Amer: >60 mL/min (ref >60)
Glucose, Bld: 207 mg/dL — ABNORMAL HIGH (ref 65–99)
Potassium: 3.9 mmol/L (ref 3.5–5.1)
Sodium: 135 mmol/L (ref 135–145)

## 2016-07-14 LAB — CBC WITH DIFFERENTIAL/PLATELET
BASOS PCT: 0 %
Basophils Absolute: 0 10*3/uL (ref 0.0–0.1)
Eosinophils Absolute: 0.1 10*3/uL (ref 0.0–0.7)
Eosinophils Relative: 1 %
HEMATOCRIT: 39.9 % (ref 39.0–52.0)
HEMOGLOBIN: 13.7 g/dL (ref 13.0–17.0)
LYMPHS ABS: 2.4 10*3/uL (ref 0.7–4.0)
LYMPHS PCT: 22 %
MCH: 33.3 pg (ref 26.0–34.0)
MCHC: 34.3 g/dL (ref 30.0–36.0)
MCV: 96.8 fL (ref 78.0–100.0)
MONOS PCT: 9 %
Monocytes Absolute: 0.9 10*3/uL (ref 0.1–1.0)
NEUTROS ABS: 7.4 10*3/uL (ref 1.7–7.7)
NEUTROS PCT: 68 %
Platelets: 227 10*3/uL (ref 150–400)
RBC: 4.12 MIL/uL — ABNORMAL LOW (ref 4.22–5.81)
RDW: 14.2 % (ref 11.5–15.5)
WBC: 10.9 10*3/uL — ABNORMAL HIGH (ref 4.0–10.5)

## 2016-07-14 LAB — I-STAT CHEM 8, ED
BUN: 28 mg/dL — AB (ref 6–20)
Calcium, Ion: 1.14 mmol/L — ABNORMAL LOW (ref 1.15–1.40)
Chloride: 96 mmol/L — ABNORMAL LOW (ref 101–111)
Creatinine, Ser: 1 mg/dL (ref 0.61–1.24)
Glucose, Bld: 206 mg/dL — ABNORMAL HIGH (ref 65–99)
HEMATOCRIT: 40 % (ref 39.0–52.0)
HEMOGLOBIN: 13.6 g/dL (ref 13.0–17.0)
Potassium: 4.1 mmol/L (ref 3.5–5.1)
SODIUM: 137 mmol/L (ref 135–145)
TCO2: 32 mmol/L (ref 0–100)

## 2016-07-14 LAB — GLUCOSE, CAPILLARY: Glucose-Capillary: 211 mg/dL — ABNORMAL HIGH (ref 65–99)

## 2016-07-14 LAB — BRAIN NATRIURETIC PEPTIDE: B Natriuretic Peptide: 88.3 pg/mL (ref 0.0–100.0)

## 2016-07-14 LAB — I-STAT TROPONIN, ED: Troponin i, poc: 0 ng/mL (ref 0.00–0.08)

## 2016-07-14 MED ORDER — NICOTINE 21 MG/24HR TD PT24
21.0000 mg | MEDICATED_PATCH | Freq: Every day | TRANSDERMAL | Status: DC
  Administered 2016-07-14: 21 mg via TRANSDERMAL
  Filled 2016-07-14: qty 1

## 2016-07-14 MED ORDER — ASPIRIN 81 MG PO CHEW: 81.0000 mg | CHEWABLE_TABLET | Freq: Every day | ORAL | Status: DC

## 2016-07-14 MED ORDER — SERTRALINE HCL 100 MG PO TABS
200.0000 mg | ORAL_TABLET | Freq: Every day | ORAL | Status: DC
  Administered 2016-07-14: 200 mg via ORAL
  Filled 2016-07-14: qty 2

## 2016-07-14 MED ORDER — CLOPIDOGREL BISULFATE 75 MG PO TABS
75.0000 mg | ORAL_TABLET | Freq: Every day | ORAL | Status: DC
  Administered 2016-07-14: 75 mg via ORAL
  Filled 2016-07-14: qty 1

## 2016-07-14 MED ORDER — PANTOPRAZOLE SODIUM 40 MG PO TBEC
40.0000 mg | DELAYED_RELEASE_TABLET | Freq: Every day | ORAL | Status: DC
  Administered 2016-07-14: 40 mg via ORAL
  Filled 2016-07-14: qty 1

## 2016-07-14 MED ORDER — HEPARIN SODIUM (PORCINE) 5000 UNIT/ML IJ SOLN
5000.0000 [IU] | Freq: Three times a day (TID) | INTRAMUSCULAR | Status: DC
  Administered 2016-07-14 (×2): 5000 [IU] via SUBCUTANEOUS
  Filled 2016-07-14 (×2): qty 1

## 2016-07-14 MED ORDER — ISOSORBIDE MONONITRATE ER 30 MG PO TB24
30.0000 mg | ORAL_TABLET | Freq: Every day | ORAL | Status: DC
  Administered 2016-07-14: 30 mg via ORAL
  Filled 2016-07-14: qty 1

## 2016-07-14 MED ORDER — ALBUTEROL SULFATE (2.5 MG/3ML) 0.083% IN NEBU: 2.5000 mg | INHALATION_SOLUTION | Freq: Four times a day (QID) | RESPIRATORY_TRACT | Status: DC | PRN

## 2016-07-14 MED ORDER — INSULIN ASPART 100 UNIT/ML ~~LOC~~ SOLN
0.0000 [IU] | Freq: Three times a day (TID) | SUBCUTANEOUS | Status: DC
  Administered 2016-07-14: 3 [IU] via SUBCUTANEOUS
  Administered 2016-07-14: 5 [IU] via SUBCUTANEOUS
  Filled 2016-07-14: qty 1

## 2016-07-14 MED ORDER — ATORVASTATIN CALCIUM 80 MG PO TABS
80.0000 mg | ORAL_TABLET | Freq: Every day | ORAL | Status: DC
  Administered 2016-07-14: 80 mg via ORAL
  Filled 2016-07-14: qty 1

## 2016-07-14 MED ORDER — ALPRAZOLAM 0.25 MG PO TABS
0.2500 mg | ORAL_TABLET | Freq: Two times a day (BID) | ORAL | Status: DC | PRN
  Administered 2016-07-14: 0.25 mg via ORAL
  Filled 2016-07-14: qty 1

## 2016-07-14 MED ORDER — CLONAZEPAM 0.5 MG PO TABS
0.2500 mg | ORAL_TABLET | Freq: Every day | ORAL | Status: DC
  Administered 2016-07-14: 0.25 mg via ORAL
  Filled 2016-07-14: qty 1

## 2016-07-14 MED ORDER — LISINOPRIL 40 MG PO TABS
40.0000 mg | ORAL_TABLET | Freq: Every day | ORAL | Status: DC
  Administered 2016-07-14: 40 mg via ORAL
  Filled 2016-07-14: qty 1

## 2016-07-14 MED ORDER — ASPIRIN EC 81 MG PO TBEC
81.0000 mg | DELAYED_RELEASE_TABLET | Freq: Every day | ORAL | Status: DC
  Filled 2016-07-14: qty 1

## 2016-07-14 MED ORDER — ONDANSETRON HCL 4 MG/2ML IJ SOLN: 4.0000 mg | Freq: Four times a day (QID) | INTRAMUSCULAR | Status: DC | PRN

## 2016-07-14 MED ORDER — FUROSEMIDE 10 MG/ML IJ SOLN
40.0000 mg | Freq: Two times a day (BID) | INTRAMUSCULAR | Status: DC
  Administered 2016-07-14: 40 mg via INTRAVENOUS
  Filled 2016-07-14: qty 4

## 2016-07-14 MED ORDER — IPRATROPIUM-ALBUTEROL 0.5-2.5 (3) MG/3ML IN SOLN
3.0000 mL | Freq: Once | RESPIRATORY_TRACT | Status: AC
  Administered 2016-07-14: 3 mL via RESPIRATORY_TRACT
  Filled 2016-07-14: qty 3

## 2016-07-14 MED ORDER — TIOTROPIUM BROMIDE MONOHYDRATE 18 MCG IN CAPS
18.0000 ug | ORAL_CAPSULE | Freq: Every day | RESPIRATORY_TRACT | Status: DC
  Filled 2016-07-14: qty 5

## 2016-07-14 MED ORDER — ACETAMINOPHEN 325 MG PO TABS: 650.0000 mg | ORAL_TABLET | ORAL | Status: DC | PRN

## 2016-07-14 MED ORDER — NITROGLYCERIN 0.4 MG SL SUBL: 0.4000 mg | SUBLINGUAL_TABLET | SUBLINGUAL | Status: DC | PRN

## 2016-07-14 NOTE — ED Provider Notes (Signed)
[facility]-EMER[facility]ENCY DEPT Provider Note   CSN: [id number] Arrival date & time: 07/14/16  1022     History   Chief Complaint Chief Complaint  Patient presents with  . Bradycardia  . Dizziness    HPI Dwayne [name] is a 71 y.o. male.  71 yo M with a chief complaints of shortness of breath on exertion increasing lower extremity edema and feeling that he is going to pass out. Going on for the past week or so. Went to see his cardiologist today who is concerned with a Mobitz type II AV block on his EKG. Was to be direct admitted but there are no beds in the hospital was sent to the ED for continuous cardiac monitoring while awaiting a bed.   The history is provided by the patient.  Dizziness  Associated symptoms: shortness of breath   Associated symptoms: no chest pain, no diarrhea, no headaches, no palpitations and no vomiting   Illness  This is a new problem. The current episode started 2 days ago. The problem occurs constantly. The problem has not changed since onset.Associated symptoms include shortness of breath. Pertinent negatives include no chest pain, no abdominal pain and no headaches. Nothing aggravates the symptoms. Nothing relieves the symptoms. He has tried nothing for the symptoms. The treatment provided no relief.    Past Medical History:  Diagnosis Date  . Anxiety   . Arthritis    "knees, elbows, hands" (02/12/2014)  . Compression fracture of L2 (HCC) 03/08/2013  . COPD (chronic obstructive pulmonary disease) (HCC)   . Coronary artery disease    Dr. Delton See, [facility]- follows  . Depression   . Esophageal reflux   . Gallstones   . Hyperlipidemia   . Hypertension, benign    Benign  . 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 Myocardial infarction 1998 X 2   "probably a couple since 1998" (02/12/2014)  . Palpitations   . Pneumonia ~ 2004 X 1  . Swallowing difficulty    intermittent-occ"doesn't matter with solid or  liquid"    Patient Active Problem List   Diagnosis Date Noted  . Dizziness 07/14/2016  . Symptomatic bradycardia 07/14/2016  . Cholelithiasis and cholecystitis without obstruction 03/07/2016  . Diabetic neuropathy (HCC) 07/30/2014  . Adjustment disorder with depressed mood 02/24/2014  . Acid reflux 02/24/2014  . Diabetes mellitus, type 2 (HCC) 02/24/2014  . 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 Abnormal nuclear stress test 02/12/2014  . Hx of class III angina pectoris 02/12/2014  . Angina pectoris, crescendo (HCC) 02/12/2014  . History of class III angina pectoris 02/12/2014  . Lumbar vertebral fracture (HCC) 03/07/2013  . Angioedema 03/07/2013  . Depressive disorder, not elsewhere classified 02/25/2013  . CAD (coronary artery disease) of artery bypass graft 01/30/2013  . Essential hypertension 01/30/2013  . Hyperlipidemia 01/30/2013  . IDDM (insulin dependent diabetes mellitus) (HCC) 01/30/2013  . Medication refill 01/30/2013  . Chest pain 08/03/2012  . Arteriosclerosis of coronary artery 04/26/2012  . Accelerating angina (HCC) 04/25/2012  . Other reasons for seeking consultation 04/01/2011  . ACS (acute coronary syndrome) (HCC) 03/28/2011  . Anxiety 07/27/2010  . Healed myocardial infarct 06/29/2010  . Presence of stent in coronary artery 06/29/2010  . Advance directive discussed with patient 06/04/2010  . Benign essential HTN 01/17/2006  . Blood in the urine 01/17/2006  . HLD (hyperlipidemia) 01/17/2006  .  Awareness of heartbeats 01/17/2006  . Cardiovascular degeneration (with mention of arteriosclerosis) 01/17/2006    Past Surgical History:  Procedure Laterality Date  . CARDIAC CATHETERIZATION  ~ 2012  . CATARACT EXTRACTION W/ INTRAOCULAR LENS IMPLANT Right 10/2013  . CHOLECYSTECTOMY N/A 03/07/2016   Procedure: LAPAROSCOPIC CHOLECYSTECTOMY WITH INTRAOPERATIVE CHOLANGIOGRAM;  Surgeon: Glenna Fellows,  MD;  Location: [facility];  Service: General;  Laterality: N/A;  . CORONARY ANGIOPLASTY  1998  . CORONARY ANGIOPLASTY WITH STENT PLACEMENT  1999; 2014; 02/12/2014   "1; 2; 1"  . LEFT HEART CATHETERIZATION WITH CORONARY ANGIOGRAM N/A 02/12/2014   Procedure: LEFT HEART CATHETERIZATION WITH CORONARY ANGIOGRAM;  Surgeon: Lesleigh Noe, MD;  Location: [facility];  Service: Cardiovascular;  Laterality: N/A;       Home Medications    Prior to Admission medications   Medication Sig Start Date End Date Taking? Authorizing Provider  acetaminophen (TYLENOL) 500 MG tablet Take 500 mg by mouth every 6 (six) hours as needed for mild pain.   Yes Historical Provider, MD  aspirin 81 MG chewable tablet Chew 1 tablet (81 mg total) by mouth daily. 02/13/14  Yes Brittainy Sherlynn Carbon, PA-C  atorvastatin (LIPITOR) 80 MG tablet Take 80 mg by mouth daily at 6 PM.    Yes Historical Provider, MD  clonazePAM (KLONOPIN) 0.5 MG tablet Take 0.25 mg by mouth daily.  01/30/13  Yes Lars Masson, MD  clopidogrel (PLAVIX) 75 MG tablet TAKE ONE TABLET BY MOUTH ONCE DAILY 06/29/15  Yes Lars Masson, MD  furosemide (LASIX) 20 MG tablet Take 1 tablet (20 mg total) by mouth daily. 11/03/15  Yes Lars Masson, MD  GLIPIZIDE XL 10 MG 24 hr tablet Take 1 tablet by mouth 2 (two) times daily. 02/17/16  Yes Historical Provider, MD  hydrochlorothiazide (HYDRODIURIL) 25 MG tablet Take 25 mg by mouth daily.  12/16/15  Yes Historical Provider, MD  insulin glargine (LANTUS) 100 unit/mL SOPN Inject 16 Units into the skin at bedtime.    Yes Historical Provider, MD  isosorbide mononitrate (IMDUR) 30 MG 24 hr tablet TAKE ONE TABLET BY MOUTH ONCE DAILY Patient taking differently: TAKE ONE TABLET BY MOUTH ONCE DAILY. Takes at bedtime 02/16/16  Yes Lars Masson, MD  lisinopril (PRINIVIL,ZESTRIL) 40 MG tablet Take 40 mg by mouth daily.   Yes Historical Provider, MD  metFORMIN (GLUCOPHAGE) 1000 MG tablet Take 1 tablet (1,000 mg total) by  mouth 2 (two) times daily with a meal. 02/16/14  Yes Brittainy Sherlynn Carbon, PA-C  metoprolol succinate (TOPROL-XL) 25 MG 24 hr tablet Take 25 mg by mouth daily.   Yes Historical Provider, MD  nitroGLYCERIN (NITROSTAT) 0.4 MG SL tablet Place 0.4 mg under the tongue every 5 (five) minutes as needed for chest pain (x 3 doses).   Yes Historical Provider, MD  oxyCODONE (OXY IR/ROXICODONE) 5 MG immediate release tablet Take 1-2 tablets (5-10 mg total) by mouth every 4 (four) hours as needed for moderate pain. 03/08/16  Yes Glenna Fellows, MD  pantoprazole (PROTONIX) 40 MG tablet Take 1 tablet (40 mg total) by mouth daily. 03/11/13  Yes Joseph Art, DO  Probiotic Product (PROBIOTIC PO) Take 1 tablet by mouth daily.   Yes Historical Provider, MD  sertraline (ZOLOFT) 100 MG tablet Take 200 mg by mouth at bedtime.    Yes Historical Provider, MD  albuterol (PROVENTIL HFA;VENTOLIN HFA) 108 (90 BASE) MCG/ACT inhaler Inhale 2 puffs into the lungs every 6 (six) hours as needed for wheezing.  Historical Provider, MD    Family History Family History  Problem Relation Age of Onset  . Heart attack Father   . Diabetes Father   . Stroke Neg Hx     Social History Social History  Substance Use Topics  . Smoking status: Current Every Day Smoker    Packs/day: 0.50    Years: 50.00    Types: Cigarettes  . Smokeless tobacco: Never Used     Comment: Counseled on quitting smoking  . Alcohol use 0.0 oz/week     Comment: "I was a drinker; I haven't had a drink since 1990"     Allergies   Morphine and related   Review of Systems Review of Systems  Constitutional: Negative for chills and fever.  HENT: Negative for congestion and facial swelling.   Eyes: Negative for discharge and visual disturbance.  Respiratory: Positive for shortness of breath.   Cardiovascular: Positive for leg swelling. Negative for chest pain and palpitations.  Gastrointestinal: Negative for abdominal pain, diarrhea and vomiting.    Musculoskeletal: Negative for arthralgias and myalgias.  Skin: Negative for color change and rash.  Neurological: Positive for dizziness. Negative for tremors, syncope and headaches.  Psychiatric/Behavioral: Negative for confusion and dysphoric mood.     Physical Exam Updated Vital Signs BP (!) 155/91   Pulse 72   Temp 97.6 F (36.4 C) (Oral)   Resp 18   Ht 5\' 9"  (1.753 m)   Wt 287 lb 0.6 oz (130.2 kg)   SpO2 95%   BMI 42.39 kg/m   Physical Exam  Constitutional: He is oriented to person, place, and time. He appears well-developed and well-nourished.  HENT:  Head: Normocephalic and atraumatic.  Eyes: EOM are normal. Pupils are equal, round, and reactive to light.  Neck: Normal range of motion. Neck supple. No JVD present.  Cardiovascular: Normal rate and regular rhythm.  Exam reveals no gallop and no friction rub.   No murmur heard. Pulmonary/Chest: No respiratory distress. He has no wheezes.  Abdominal: He exhibits no distension and no mass. There is no tenderness. There is no rebound and no guarding.  Musculoskeletal: Normal range of motion. He exhibits edema (4+ to the thigh).  Neurological: He is alert and oriented to person, place, and time.  Skin: No rash noted. No pallor.  Psychiatric: He has a normal mood and affect. His behavior is normal.  Nursing note and vitals reviewed.    ED Treatments / Results  Labs (all labs ordered are listed, but only abnormal results are displayed) Labs Reviewed  CBC WITH DIFFERENTIAL/PLATELET - Abnormal; Notable for the following:       Result Value   WBC 10.9 (*)    RBC 4.12 (*)    All other components within normal limits  BASIC METABOLIC PANEL - Abnormal; Notable for the following:    Chloride 98 (*)    Glucose, Bld 207 (*)    BUN 21 (*)    All other components within normal limits  I-STAT CHEM 8, ED - Abnormal; Notable for the following:    Chloride 96 (*)    BUN 28 (*)    Glucose, Bld 206 (*)    Calcium, Ion 1.14 (*)     All other components within normal limits  CBG MONITORING, ED - Abnormal; Notable for the following:    Glucose-Capillary 172 (*)    All other components within normal limits  BRAIN NATRIURETIC PEPTIDE  TSH  T4  CBC  CREATININE, SERUM  I-STAT TROPOININ,  ED    EKG  EKG Interpretation  Date/Time:  Thursday July 14 2016 10:33:54 EST Ventricular Rate:  61 PR Interval:    QRS Duration: 90 QT Interval:  428 QTC Calculation: 430 R Axis:   -33 Text Interpretation:   Critical Test Result: AV Block Sinus rhythm with 2nd degree A-V block (Mobitz I) Left axis deviation Abnormal ECG Otherwise no significant change Confirmed by Keren Alverio MD, DANIEL [phone]([id number]) on 07/14/2016 11:03:15 AM       Radiology Dg Chest Port 1 View  Result Date: 07/14/2016 CLINICAL DATA:  One month of dizziness, found have bradycardia. No chest pain but does report shortness of breath and peripheral edema. History of coronary artery disease, COPD, current smoker, previous episodes of pneumonia. EXAM: PORTABLE CHEST 1 VIEW COMPARISON:  PA and lateral chest x-ray dated September 02, 2015 FINDINGS: The lungs are well-expanded. There is no focal infiltrate. The interstitial markings are coarse though stable. The cardiac silhouette is enlarged and slightly more conspicuous than in the past. The pulmonary vascularity is not engorged. The mediastinum is normal in width. There is calcification in the wall of the aortic arch. The bony thorax exhibits no acute abnormality. IMPRESSION: COPD. No acute pneumonia. Mild cardiomegaly without pulmonary edema. Thoracic aortic atherosclerosis. Electronically Signed   By: David  Swazil[place]Jordan M.D.   On: 07/14/2016 11:48    Procedures Procedures (including critical care time)  Medications Ordered in ED Medications  isosorbide mononitrate (IMDUR) 24 hr tablet 30 mg (30 mg Oral Given 07/14/16 1529)  clopidogrel (PLAVIX) tablet 75 mg (75 mg Oral Given 07/14/16 1529)  albuterol (PROVENTIL) (2.5 MG/3ML) 0.083%  nebulizer solution 2.5 mg (not administered)  clonazePAM (KLONOPIN) tablet 0.25 mg (0.25 mg Oral Given 07/14/16 1529)  pantoprazole (PROTONIX) EC tablet 40 mg (40 mg Oral Given 07/14/16 1529)  atorvastatin (LIPITOR) tablet 80 mg (not administered)  lisinopril (PRINIVIL,ZESTRIL) tablet 40 mg (40 mg Oral Given 07/14/16 1529)  sertraline (ZOLOFT) tablet 200 mg (not administered)  aspirin EC tablet 81 mg (not administered)  nitroGLYCERIN (NITROSTAT) SL tablet 0.4 mg (not administered)  acetaminophen (TYLENOL) tablet 650 mg (not administered)  ondansetron (ZOFRAN) injection 4 mg (not administered)  heparin injection 5,000 Units (5,000 Units Subcutaneous Given 07/14/16 1530)  furosemide (LASIX) injection 40 mg (40 mg Intravenous Given 07/14/16 1529)  insulin aspart (novoLOG) injection 0-15 Units (3 Units Subcutaneous Given 07/14/16 1344)  tiotropium (SPIRIVA) inhalation capsule 18 mcg (not administered)  nicotine (NICODERM CQ - dosed in mg/24 hours) patch 21 mg (21 mg Transdermal Patch Applied 07/14/16 1531)  ipratropium-albuterol (DUONEB) 0.5-2.5 (3) MG/3ML nebulizer solution 3 mL (3 mLs Nebulization Given 07/14/16 1239)     Initial Impression / Assessment and Plan / ED Course  I have reviewed the triage vital signs and the nursing notes.  Pertinent labs & imaging results that were available during my care of the patient were reviewed by me and considered in my medical decision making (see chart for details).     71 yo M with a chief complaint of weakness and feeling like he's can pass out. Sent from cardiology clinic with a Mobitz type II AV block. After placed on the patient. Placed on cardiac monitoring. Labs are ordered chest x-ray. Discussed with cardiology who put in bed orders.  CRITICAL CARE Performed by: Rae Roamaniel Patrick Hero Kulish   Total critical care time: 35 minutes  Critical care time was exclusive of separately billable procedures and treating other patients.  Critical care was necessary to  treat or prevent imminent  or life-threatening deterioration.  Critical care was time spent personally by me on the following activities: development of treatment plan with patient and/or surrogate as well as nursing, discussions with consultants, evaluation of patient's response to treatment, examination of patient, obtaining history from patient or surrogate, ordering and performing treatments and interventions, ordering and review of laboratory studies, ordering and review of radiographic studies, pulse oximetry and re-evaluation of patient's condition.   The patients results and plan were reviewed and discussed.   Any x-rays performed were independently reviewed by myself.   Differential diagnosis were considered with the presenting HPI.  Medications  isosorbide mononitrate (IMDUR) 24 hr tablet 30 mg (30 mg Oral Given 07/14/16 1529)  clopidogrel (PLAVIX) tablet 75 mg (75 mg Oral Given 07/14/16 1529)  albuterol (PROVENTIL) (2.5 MG/3ML) 0.083% nebulizer solution 2.5 mg (not administered)  clonazePAM (KLONOPIN) tablet 0.25 mg (0.25 mg Oral Given 07/14/16 1529)  pantoprazole (PROTONIX) EC tablet 40 mg (40 mg Oral Given 07/14/16 1529)  atorvastatin (LIPITOR) tablet 80 mg (not administered)  lisinopril (PRINIVIL,ZESTRIL) tablet 40 mg (40 mg Oral Given 07/14/16 1529)  sertraline (ZOLOFT) tablet 200 mg (not administered)  aspirin EC tablet 81 mg (not administered)  nitroGLYCERIN (NITROSTAT) SL tablet 0.4 mg (not administered)  acetaminophen (TYLENOL) tablet 650 mg (not administered)  ondansetron (ZOFRAN) injection 4 mg (not administered)  heparin injection 5,000 Units (5,000 Units Subcutaneous Given 07/14/16 1530)  furosemide (LASIX) injection 40 mg (40 mg Intravenous Given 07/14/16 1529)  insulin aspart (novoLOG) injection 0-15 Units (3 Units Subcutaneous Given 07/14/16 1344)  tiotropium (SPIRIVA) inhalation capsule 18 mcg (not administered)  nicotine (NICODERM CQ - dosed in mg/24 hours) patch 21 mg (21 mg  Transdermal Patch Applied 07/14/16 1531)  ipratropium-albuterol (DUONEB) 0.5-2.5 (3) MG/3ML nebulizer solution 3 mL (3 mLs Nebulization Given 07/14/16 1239)    Vitals:   07/14/16 1315 07/14/16 1330 07/14/16 1400 07/14/16 1500  BP: 151/73 138/65 (!) 155/91 (!) 155/91  Pulse: 72 70  72  Resp: 23  (!) 21 18  Temp:    97.6 F (36.4 C)  TempSrc:    Oral  SpO2: 94% 95%  95%  Weight:   287 lb 0.6 oz (130.2 kg)   Height:   5\' 9"  (1.753 m)     Final diagnoses:  AV block, Mobitz II  Symptomatic bradycardia     The patients results and plan were reviewed and discussed.   Any x-rays performed were independently reviewed by myself.   Differential diagnosis were considered with the presenting HPI.  Medications  isosorbide mononitrate (IMDUR) 24 hr tablet 30 mg (30 mg Oral Given 07/14/16 1529)  clopidogrel (PLAVIX) tablet 75 mg (75 mg Oral Given 07/14/16 1529)  albuterol (PROVENTIL) (2.5 MG/3ML) 0.083% nebulizer solution 2.5 mg (not administered)  clonazePAM (KLONOPIN) tablet 0.25 mg (0.25 mg Oral Given 07/14/16 1529)  pantoprazole (PROTONIX) EC tablet 40 mg (40 mg Oral Given 07/14/16 1529)  atorvastatin (LIPITOR) tablet 80 mg (not administered)  lisinopril (PRINIVIL,ZESTRIL) tablet 40 mg (40 mg Oral Given 07/14/16 1529)  sertraline (ZOLOFT) tablet 200 mg (not administered)  aspirin EC tablet 81 mg (not administered)  nitroGLYCERIN (NITROSTAT) SL tablet 0.4 mg (not administered)  acetaminophen (TYLENOL) tablet 650 mg (not administered)  ondansetron (ZOFRAN) injection 4 mg (not administered)  heparin injection 5,000 Units (5,000 Units Subcutaneous Given 07/14/16 1530)  furosemide (LASIX) injection 40 mg (40 mg Intravenous Given 07/14/16 1529)  insulin aspart (novoLOG) injection 0-15 Units (3 Units Subcutaneous Given 07/14/16 1344)  tiotropium (SPIRIVA) inhalation capsule  18 mcg (not administered)  nicotine (NICODERM CQ - dosed in mg/24 hours) patch 21 mg (21 mg Transdermal Patch Applied 07/14/16 1531)    ipratropium-albuterol (DUONEB) 0.5-2.5 (3) MG/3ML nebulizer solution 3 mL (3 mLs Nebulization Given 07/14/16 1239)    Vitals:   07/14/16 1315 07/14/16 1330 07/14/16 1400 07/14/16 1500  BP: 151/73 138/65 (!) 155/91 (!) 155/91  Pulse: 72 70  72  Resp: 23  (!) 21 18  Temp:    97.6 F (36.4 C)  TempSrc:    Oral  SpO2: 94% 95%  95%  Weight:   287 lb 0.6 oz (130.2 kg)   Height:   5\' 9"  (1.753 m)     Final diagnoses:  AV block, Mobitz II  Symptomatic bradycardia     Final Clinical Impressions(s) / ED Diagnoses   Final diagnoses:  AV block, Mobitz II  Symptomatic bradycardia    New Prescriptions Current Discharge Medication List       Melene Plan, DO 07/14/16 1609

## 2016-07-14 NOTE — ED Notes (Signed)
Pt called out for help using the bathroom. Pt's wife wanted him disconnected from monitor so he could use the bathroom. Wife informed that due to lower heart rate and pt being on Zoll pads, pt needs to remain on the monitor. Pt's wife also upset that no one helping pt to use the bathroom. Wife was informed that this Tech was assisting another pt at the time that she called out. Wife stated, "Just go. I got it." A bedside commode was placed in pt's room. Wife stated that pt is going to have problems using the bedside commode.

## 2016-07-14 NOTE — ED Notes (Signed)
Pt given lunch tray.

## 2016-07-14 NOTE — ED Triage Notes (Addendum)
Pt states month of dizziness; seen by cardiologist today and told to come to ER due to bradycardia (48). He states today dizziness is worst it has been. Denies chest pain; some SOB. Pt reports swollen feet. They state that he is to be direct admission but wanted him monitored in meantime while waiting for his room to come available.

## 2016-07-14 NOTE — Patient Instructions (Signed)
Medication Instructions:   STOP YOUR TOPROL XL NOW    Follow-Up:  DR NELSON HAS ADMITTED YOU TO [facility] FOR SYMPTOMATIC BRADYCARDIA AND CHF---YOU WILL NEED TO REPORT TO [place] TO BE TRIAGED, AND CARED FOR ACCORDINGLY, UNTIL YOUR BED ON THE FLOOR IS READY.  [facility] RN AT [place] IS AWARE THAT YOU WILL BE ARRIVING.  DR. Delton SeeNELSON HAS PLACED THE HOSPITAL ORDERS IN THE SYSTEM FOR YOU TO RECEIVE A TELEMETRY BED.  [facility] OUR CARDMASTER IS AWARE THAT YOU WILL BE COMING.        If you need a refill on your cardiac medications before your next appointment, please call your pharmacy.

## 2016-07-14 NOTE — ED Notes (Signed)
Pt and wife insistent on pt ambulating to the restroom. Asked patient to urinate using the urinal due to chief complaint of bradycardia. Pt states he will walk to the restroom no matter what. Urinal provided at this time. Pt placed on zoll pads, wife at bedside.

## 2016-07-14 NOTE — H&P (Addendum)
Patient ID: Dwayne [name], male   DOB: 10/30/1945, 71 y.o.   MRN: [id number]    Patient Name: Dwayne [name] Date of Encounter: 07/14/2016  Primary Care Provider:  Farris HasMORROW, AARON, MD Primary Cardiologist:  Tobias AlexanderKatarina Neven Fina  Chief complain: Fatigue  Problem List   Past Medical History:  Diagnosis Date  . Anxiety   . Arthritis    "knees, elbows, hands" (02/12/2014)  . Compression fracture of L2 (HCC) 03/08/2013  . COPD (chronic obstructive pulmonary disease) (HCC)   . Coronary artery disease    [facility]  . Depression   . Esophageal reflux   . Gallstones   . Hyperlipidemia   . Hypertension, benign    Benign  . 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 Myocardial infarction 1998 X 2   "probably a couple since 1998" (02/12/2014)  . Palpitations   . Pneumonia ~ 2004 X 1  . Swallowing difficulty    intermittent-occ"doesn't matter with solid or liquid"   Past Surgical History:  Procedure Laterality Date  . CARDIAC CATHETERIZATION  ~ 2012  . CATARACT EXTRACTION W/ INTRAOCULAR LENS IMPLANT Right 10/2013  . CHOLECYSTECTOMY N/A 03/07/2016   Procedure: LAPAROSCOPIC CHOLECYSTECTOMY WITH INTRAOPERATIVE CHOLANGIOGRAM;  Surgeon: Glenna FellowsBenjamin Hoxworth, MD;  Location: [facility];  Service: General;  Laterality: N/A;  . CORONARY ANGIOPLASTY  1998  . CORONARY ANGIOPLASTY WITH STENT PLACEMENT  1999; 2014; 02/12/2014   "1; 2; 1"  . LEFT HEART CATHETERIZATION WITH CORONARY ANGIOGRAM N/A 02/12/2014   Procedure: LEFT HEART CATHETERIZATION WITH CORONARY ANGIOGRAM;  Surgeon: Lesleigh NoeHenry W Smith III, MD;  [facility];  Service: Cardiovascular;  Laterality: N/A;   Allergies  Allergies  Allergen Reactions  . Morphine And Related Swelling    Swelling of the tongue and face   HPI  71 year old male with h/o IDDM, obesity, hypertension, hyperlipidemia, ongoing smoking and CAD [place] to [place] cardi[facility] care. He  was diagnosed with CAD in 1998, when he had an inferior MI and received stent to RCA, in 1999 stent to diagonal (per patient). He was doing well until 2012 when he had another episode chest pain and received stent to RCA another the next day for in stent restenosis. He was sterted on Prasugrel after that. He describes his pain as reflux but stronger. He states that since the last stent he has been asymptomatic. He is trying to exercise daily and has CP or SOB.    1 year followup - 12/17/2013 - the patient had a back injury in November 2014. He is otherwise doing well no chest pain, no shortness of breath, he walks mainly because of his back injury. He denies any lower extremity edema orthopnea medications. His only complaint is fatigue and sometimes low blood pressure.  03/28/2014 - the patient was seen in September for recurrent chest pains. He underwent nuclear stress test that was high risk stress test with moderate sized reversible inferior ischemia. He underwent cardiac catheterization on 08/13/2013 and was found to have severe in-stent restenosis in the proximal RCA stent. He received a stent and continued on dual antiplatelet therapy with aspirin and Effient. He is coming today refers that he is severely fatigued and short of breath. He states he didn't get relief he used to get when he got stents in the past. He also feels depressed and has hard time quitting smoking.  07/30/2014 - the patient is  coming after 4 months, he denies any chest pain, he shortness of breath has improved, however he states that he has been feeling down, with no energy and fatigue. He stopped using narcotics for back pain that has resolved. He states that lately he hasn't been exercising at all and he has been eating a lot of junk food that lead to 20 pound weight gain. He denies any palpitations or syncope. His bruising of his upper extremities has improved on Plavix when compared to Brilinta, he is compliant with his    11/03/2015, the patient is coming after 3 months, he was switched from hydrochlorothiazide to Lasix at the last visit, however only took a few doses and felt dizzy so he discontinued. He continues to have lower extremity edema worse on the left than right. He denies any chest shortness of breath. He has been experiencing pain in his throat and upper chest and is scheduled for endoscopy. Denies any palpitations or syncope no orthopnea or paroxysmal nocturnal dyspnea.  07/14/2016 - the patient is coming after 9 months, he states that he feels dizzy and on one occasion 2 weeks ago he fell down. He just feels like he has no energy and uses the word lethargic.  He feels dizzy all the time, higher, and slightly short of breath. He also has developed lower extremity edema and wakes up at night feeling short of breath he has been using Lasix in the last few months.   Home Medications  Prior to Admission medications   Medication Sig Start Date End Date Taking? Authorizing Provider  albuterol (PROVENTIL) (2.5 MG/3ML) 0.083% nebulizer solution Take 2.5 mg by nebulization every 6 (six) hours as needed for wheezing.   Yes Historical Provider, MD  atorvastatin (LIPITOR) 80 MG tablet Take 80 mg by mouth daily.   Yes Historical Provider, MD  clonazePAM (KLONOPIN) 0.5 MG tablet Take 0.5 mg by mouth 2 (two) times daily as needed for anxiety.   Yes Historical Provider, MD  GLIPIZIDE ER PO Take 10 mg by mouth 2 (two) times daily.   Yes Historical Provider, MD  hydrochlorothiazide (HYDRODIURIL) 25 MG tablet Take 25 mg by mouth daily.   Yes Historical Provider, MD  Insulin Glargine (LANTUS [place]) Inject 14 Units into the skin daily.   Yes Historical Provider, MD  lisinopril (PRINIVIL,ZESTRIL) 40 MG tablet Take 40 mg by mouth daily.   Yes Historical Provider, MD  metFORMIN (GLUCOPHAGE) 1000 MG tablet Take 1,000 mg by mouth 2 (two) times daily with a meal.   Yes Historical Provider, MD  metoprolol (LOPRESSOR) 50 MG tablet  Take 50 mg by mouth 2 (two) times daily.   Yes Historical Provider, MD  nitroGLYCERIN (NITROSTAT) 0.4 MG SL tablet Place 0.4 mg under the tongue every 5 (five) minutes as needed for chest pain.   Yes Historical Provider, MD  prasugrel (EFFIENT) 10 MG TABS tablet Take 10 mg by mouth daily.   Yes Historical Provider, MD  sertraline (ZOLOFT) 100 MG tablet Take 100 mg by mouth daily.   Yes Historical Provider, MD    Family History  Family History  Problem Relation Age of Onset  . Heart attack Father   . Diabetes Father   . Stroke Neg Hx     Social History  Social History   Social History  . Marital status: Married    Spouse name: N/A  . Number of children: N/A  . Years of education: N/A   Occupational History  . Not on file.  Social History Main Topics  . Smoking status: Current Every Day Smoker    Packs/day: 0.50    Years: 50.00    Types: Cigarettes  . Smokeless tobacco: Never Used     Comment: Counseled on quitting smoking  . Alcohol use 0.0 oz/week     Comment: "I was a drinker; I haven't had a drink since 1990"  . Drug use: No  . Sexual activity: No   Other Topics Concern  . Not on file   Social History Narrative  . No narrative on file     Review of Systems General:  No chills, fever, night sweats or weight changes.  Cardiovascular:  No chest pain, dyspnea on exertion, edema, orthopnea, palpitations, paroxysmal nocturnal dyspnea. Dermatological: No rash, lesions/masses Respiratory: No cough, dyspnea Urologic: No hematuria, dysuria Abdominal:   No nausea, vomiting, diarrhea, bright red blood per rectum, melena, or hematemesis Neurologic:  No visual changes, wkns, changes in mental status. All other systems reviewed and are otherwise negative except as noted above.  Physical Exam  Blood pressure 126/74, pulse (!) 48, height 5\' 10"  (1.778 m), weight 287 lb (130.2 kg).  General: Pleasant, NAD, obese Psych: Normal affect. Neuro: Alert and oriented X 3. Moves  all extremities spontaneously. HEENT: Normal  Neck: Supple without bruits or JVD. Lungs:  Barrel chest, Resp regular and unlabored, Crackles B/L.  Heart: RRR no s3, s4, or murmurs. Abdomen: Soft, non-tender, non-distended, BS + x 4. No bruit above AA  Extremities: No clubbing, cyanosis, B/L LE edema +3. DP/PT/Radials 2+ and equal bilaterally.  Accessory Clinical Findings  02/12/2014 LEFT VENTRICULOGRAM: Left ventricular angiogram was done in the 30 RAO projection and revealed normal LV size and function. EF 55%.   IMPRESSIONS: 1. 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 2. Moderate proximal to mid LAD and circumflex disease with lesions in the 50-75% stenosis range. The stent in the first diagonal is patent. There is 70% stenosis proximal to the stent margin. 3. Overall normal left ventricular function 4. Complicated but successful DES implantation the region of in-stent restenosis in the right coronary reducing a greater than 95% stenosis to 0% with TIMI grade 3 flow.   RECOMMENDATION: Aspirin and Effient Discharge in a.m. If continued angina or recurrent right coronary restenosis, may need to consider coronary bypass surgery. If the right coronary stent is patent, LAD and circumflex could be treated with PCI when they become symptomatic.  I have encouraged the patient to discontinue smoking.  10 mics of IV nitroglycerin for 12 hours then discontinue to.  ECG - SR, 1. AVB, old inferior MI, unchanged from 02/23/2014    Assessment & Plan  71 year old male  1. Symptomatic bradycardia, EKG today shows Wenckebach periods, but ventricular rate of 48 and dizziness and a fall. The patient has been on Toprol-XL 25 mg daily for several years and his heart rate has always been in 70s. We will discontinue, and admit him to the hospital for observation.  2. Acute on chronic diastolic CHF - we will draw CMP, BNP, CBC when he arrives to the hospital, start Lasix 40 mg  IV twice a day and monitor creatinine and electrolytes closely.  3. CAD, S/P multiple stenting including RCA, diagonal, most recently obtained a stent to proximal RCA to her severe in-stent rest stenosis on 02/12/2014, currently on dual antiplatelet therapy with ASA and plavix, continue ACE, statin, Imdur 30 mg daily. Discontinue Toprol-XL.  4. IDDM -  poorly controlled, HbA1c 7.9 from  6.3 the last year, poor eating habits, encouraged to change.   5. Hypertension - well controlled, sometimes low, and fatigue. We will decrease the dose of Toprol-XL to 12.5 mg daily.  6. Hyperlipidemia - well controlled on atorvastatin 80 mh QHS, last values in 08/2012 LDL 45, HDL 45, TAG 119, recheck in 6 months  7. Smoking and depression - started on Chantix , continues to smoke, he is smoking today on albuterol PRN only, we will start Spiriva.  Admitted to the hospital for symptomatic bradycardia and management of CHF.  Tobias Alexander, MD 07/14/2016, 9:34 AM

## 2016-07-14 NOTE — Progress Notes (Signed)
Patient ID: Dwayne [name], male   DOB: 10/30/1945, 71 y.o.   MRN: [id number]    Patient Name: Dwayne [name] Date of Encounter: 07/14/2016  Primary Care Provider:  Farris HasMORROW, AARON, MD Primary Cardiologist:  Tobias AlexanderKatarina Kamryn Messineo  Chief complain: Fatigue  Problem List   Past Medical History:  Diagnosis Date  . Anxiety   . Arthritis    "knees, elbows, hands" (02/12/2014)  . Compression fracture of L2 (HCC) 03/08/2013  . COPD (chronic obstructive pulmonary disease) (HCC)   . Coronary artery disease    [facility]elson[facility] follows  . Depression   . Esophageal reflux   . Gallstones   . Hyperlipidemia   . Hypertension, benign    Benign  . 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 Myocardial infarction 1998 X 2   "probably a couple since 1998" (02/12/2014)  . Palpitations   . Pneumonia ~ 2004 X 1  . Swallowing difficulty    intermittent-occ"doesn't matter with solid or liquid"   Past Surgical History:  Procedure Laterality Date  . CARDIAC CATHETERIZATION  ~ 2012  . CATARACT EXTRACTION W/ INTRAOCULAR LENS IMPLANT Right 10/2013  . CHOLECYSTECTOMY N/A 03/07/2016   Procedure: LAPAROSCOPIC CHOLECYSTECTOMY WITH INTRAOPERATIVE CHOLANGIOGRAM;  Surgeon: Glenna FellowsBenjamin Hoxworth, MD;  Location: [facility];  Service: General;  Laterality: N/A;  . CORONARY ANGIOPLASTY  1998  . CORONARY ANGIOPLASTY WITH STENT PLACEMENT  1999; 2014; 02/12/2014   "1; 2; 1"  . LEFT HEART CATHETERIZATION WITH CORONARY ANGIOGRAM N/A 02/12/2014   Procedure: LEFT HEART CATHETERIZATION WITH CORONARY ANGIOGRAM;  Surgeon: Lesleigh NoeHenry W Smith III, MD;  [facility];  Service: Cardiovascular;  Laterality: N/A;   Allergies  Allergies  Allergen Reactions  . Morphine And Related Swelling    Swelling of the tongue and face   HPI  71 year old male with h/o IDDM, obesity, hypertension, hyperlipidemia, ongoing smoking and CAD [place] [facility] [place] here to establish cardiology care. He  was diagnosed with CAD in 1998, when he had an inferior MI and received stent to RCA, in 1999 stent to diagonal (per patient). He was doing well until 2012 when he had another episode chest pain and received stent to RCA another the next day for in stent restenosis. He was sterted on Prasugrel after that. He describes his pain as reflux but stronger. He states that since the last stent he has been asymptomatic. He is trying to exercise daily and has CP or SOB.    1 year followup - 12/17/2013 - the patient had a back injury in November 2014. He is otherwise doing well no chest pain, no shortness of breath, he walks mainly because of his back injury. He denies any lower extremity edema orthopnea medications. His only complaint is fatigue and sometimes low blood pressure.  03/28/2014 - the patient was seen in September for recurrent chest pains. He underwent nuclear stress test that was high risk stress test with moderate sized reversible inferior ischemia. He underwent cardiac catheterization on 08/13/2013 and was found to have severe in-stent restenosis in the proximal RCA stent. He received a stent and continued on dual antiplatelet therapy with aspirin and Effient. He is coming today refers that he is severely fatigued and short of breath. He states he didn't get relief he used to get when he got stents in the past. He also feels depressed and has hard time quitting smoking.  07/30/2014 - the patient is  coming after 4 months, he denies any chest pain, he shortness of breath has improved, however he states that he has been feeling down, with no energy and fatigue. He stopped using narcotics for back pain that has resolved. He states that lately he hasn't been exercising at all and he has been eating a lot of junk food that lead to 20 pound weight gain. He denies any palpitations or syncope. His bruising of his upper extremities has improved on Plavix when compared to Brilinta, he is compliant with his    11/03/2015, the patient is coming after 3 months, he was switched from hydrochlorothiazide to Lasix at the last visit, however only took a few doses and felt dizzy so he discontinued. He continues to have lower extremity edema worse on the left than right. He denies any chest shortness of breath. He has been experiencing pain in his throat and upper chest and is scheduled for endoscopy. Denies any palpitations or syncope no orthopnea or paroxysmal nocturnal dyspnea.  07/14/2016 - the patient is coming after 9 months, he states that he feels dizzy and on one occasion 2 weeks ago he fell down. He just feels like he has no energy and uses the word lethargic.  He feels dizzy all the time, higher, and slightly short of breath. He also has developed lower extremity edema and wakes up at night feeling short of breath he has been using Lasix in the last few months.   Home Medications  Prior to Admission medications   Medication Sig Start Date End Date Taking? Authorizing Provider  albuterol (PROVENTIL) (2.5 MG/3ML) 0.083% nebulizer solution Take 2.5 mg by nebulization every 6 (six) hours as needed for wheezing.   Yes Historical Provider, MD  atorvastatin (LIPITOR) 80 MG tablet Take 80 mg by mouth daily.   Yes Historical Provider, MD  clonazePAM (KLONOPIN) 0.5 MG tablet Take 0.5 mg by mouth 2 (two) times daily as needed for anxiety.   Yes Historical Provider, MD  GLIPIZIDE ER PO Take 10 mg by mouth 2 (two) times daily.   Yes Historical Provider, MD  hydrochlorothiazide (HYDRODIURIL) 25 MG tablet Take 25 mg by mouth daily.   Yes Historical Provider, MD  Insulin Glargine (LANTUS [place]) Inject 14 Units into the skin daily.   Yes Historical Provider, MD  lisinopril (PRINIVIL,ZESTRIL) 40 MG tablet Take 40 mg by mouth daily.   Yes Historical Provider, MD  metFORMIN (GLUCOPHAGE) 1000 MG tablet Take 1,000 mg by mouth 2 (two) times daily with a meal.   Yes Historical Provider, MD  metoprolol (LOPRESSOR) 50 MG tablet  Take 50 mg by mouth 2 (two) times daily.   Yes Historical Provider, MD  nitroGLYCERIN (NITROSTAT) 0.4 MG SL tablet Place 0.4 mg under the tongue every 5 (five) minutes as needed for chest pain.   Yes Historical Provider, MD  prasugrel (EFFIENT) 10 MG TABS tablet Take 10 mg by mouth daily.   Yes Historical Provider, MD  sertraline (ZOLOFT) 100 MG tablet Take 100 mg by mouth daily.   Yes Historical Provider, MD    Family History  Family History  Problem Relation Age of Onset  . Heart attack Father   . Diabetes Father   . Stroke Neg Hx     Social History  Social History   Social History  . Marital status: Married    Spouse name: N/A  . Number of children: N/A  . Years of education: N/A   Occupational History  . Not on file.  Social History Main Topics  . Smoking status: Current Every Day Smoker    Packs/day: 0.50    Years: 50.00    Types: Cigarettes  . Smokeless tobacco: Never Used     Comment: Counseled on quitting smoking  . Alcohol use 0.0 oz/week     Comment: "I was a drinker; I haven't had a drink since 1990"  . Drug use: No  . Sexual activity: No   Other Topics Concern  . Not on file   Social History Narrative  . No narrative on file     Review of Systems General:  No chills, fever, night sweats or weight changes.  Cardiovascular:  No chest pain, dyspnea on exertion, edema, orthopnea, palpitations, paroxysmal nocturnal dyspnea. Dermatological: No rash, lesions/masses Respiratory: No cough, dyspnea Urologic: No hematuria, dysuria Abdominal:   No nausea, vomiting, diarrhea, bright red blood per rectum, melena, or hematemesis Neurologic:  No visual changes, wkns, changes in mental status. All other systems reviewed and are otherwise negative except as noted above.  Physical Exam  Blood pressure 126/74, pulse (!) 48, height 5\' 10"  (1.778 m), weight 287 lb (130.2 kg).  General: Pleasant, NAD, obese Psych: Normal affect. Neuro: Alert and oriented X 3. Moves  all extremities spontaneously. HEENT: Normal  Neck: Supple without bruits or JVD. Lungs:  Barrel chest, Resp regular and unlabored, Crackles B/L.  Heart: RRR no s3, s4, or murmurs. Abdomen: Soft, non-tender, non-distended, BS + x 4. No bruit above AA  Extremities: No clubbing, cyanosis, B/L LE edema +3. DP/PT/Radials 2+ and equal bilaterally.  Accessory Clinical Findings  02/12/2014 LEFT VENTRICULOGRAM: Left ventricular angiogram was done in the 30 RAO projection and revealed normal LV size and function. EF 55%.   IMPRESSIONS: 1. 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 2. Moderate proximal to mid LAD and circumflex disease with lesions in the 50-75% stenosis range. The stent in the first diagonal is patent. There is 70% stenosis proximal to the stent margin. 3. Overall normal left ventricular function 4. Complicated but successful DES implantation the region of in-stent restenosis in the right coronary reducing a greater than 95% stenosis to 0% with TIMI grade 3 flow.   RECOMMENDATION: Aspirin and Effient Discharge in a.m. If continued angina or recurrent right coronary restenosis, may need to consider coronary bypass surgery. If the right coronary stent is patent, LAD and circumflex could be treated with PCI when they become symptomatic.  I have encouraged the patient to discontinue smoking.  10 mics of IV nitroglycerin for 12 hours then discontinue to.  ECG - SR, 1. AVB, old inferior MI, unchanged from 02/23/2014    Assessment & Plan  71 year old male  1. Symptomatic bradycardia, EKG today shows Wenckebach periods, but ventricular rate of 48 and dizziness and a fall. The patient has been on Toprol-XL 25 mg daily for several years and his heart rate has always been in 70s. We will discontinue, and admit him to the hospital for observation.  2. Acute on chronic diastolic CHF - we will draw CMP, BNP, CBC when he arrives to the hospital, start Lasix 40 mg  IV twice a day and monitor creatinine and electrolytes closely.  3. CAD, S/P multiple stenting including RCA, diagonal, most recently obtained a stent to proximal RCA to her severe in-stent rest stenosis on 02/12/2014, currently on dual antiplatelet therapy with ASA and plavix, continue ACE, statin, Imdur 30 mg daily. Discontinue Toprol-XL.  4. IDDM -  poorly controlled, HbA1c 7.9 from  6.3 the last year, poor eating habits, encouraged to change.   5. Hypertension - well controlled, sometimes low, and fatigue. We will decrease the dose of Toprol-XL to 12.5 mg daily.  6. Hyperlipidemia - well controlled on atorvastatin 80 mh QHS, last values in 08/2012 LDL 45, HDL 45, TAG 119, recheck in 6 months  7. Smoking and depression - started on Chantix , continues to smoke, he is smoking today on albuterol PRN only, we will start Spiriva.  Admitted to the hospital for symptomatic bradycardia and management of CHF.  Tobias Alexander, MD 07/14/2016, 9:34 AM

## 2016-07-15 ENCOUNTER — Telehealth: Payer: Self-pay | Admitting: Cardiology

## 2016-07-15 LAB — T4: T4 TOTAL: 6.7 ug/dL (ref 4.5–12.0)

## 2016-07-15 NOTE — Telephone Encounter (Signed)
Pt calling in to the office to let Dr Delton SeeNelson know that he left AMA from the hospital yesterday, for he states "I hate [place] and I felt like a prisoner with all those cords." Pt now calling to ask Dr Delton SeeNelson what he should do. Informed the pt that Dr Delton SeeNelson is out of the office this week and all of next week.  Informed the pt that leaving AMA, doesn't allow for appropriate assessment to better care for him. Informed the pt that she was concerned about his symptomatic bradycardia, and management of his CHF.  Informed the pt that without appropriate testing and data needed to better treat him, its hard to treat him accordingly.  Advised the pt that he should continue his med regimen as Dr Delton SeeNelson advised him yesterday at the office visit, prior too getting admitted to the hospital.  Pt states that he feels better and that his HR is now 70 bpm, because he d/c'ed his Toprol XL on his own.  Advised the pt that given he went AMA, if his symptoms worsen, he should refer to the ER.  Pt then stated in an angry manner, "this is why I hate the [facility]."  "[facility] worst system to receive care at." Apologies provided to the pt, and pt education provided as well.  Informed the pt that I will route this message to Dr Delton SeeNelson for further review and recommendation, and follow-up with the pt thereafter.  Pt verbalized understanding and agrees with this plan.

## 2016-07-15 NOTE — Telephone Encounter (Signed)
New message    pt verbalized that she is calling to speak to rn    About his hospital stay last night

## 2016-07-17 NOTE — Telephone Encounter (Signed)
Please have him come for a follow up with a PA this week , he need an ECG

## 2016-07-18 NOTE — Discharge Summary (Signed)
Not sure why this came to me. Seems patient admitted from clinic by Dr. Delton SeeNelson for symptomatic bradycardia 07/14/16. However left AMA same day. He did not seen by any provider per review of notes. Please see telephone note 3/9 & 3/11 for plan.   Edel Rivero, PA-C 07/18/16 2:01pm

## 2016-07-18 NOTE — Telephone Encounter (Signed)
Spoke with the pt and informed him that per Dr Delton SeeNelson, she would like for him to come into the office to be seen, sometime this week, with an EKG at that visit.  Scheduled the pt an appt for this Wednesday 07/20/16 at 2:15 pm with Tereso NewcomerScott Weaver PA-C.  Informed the pt of this appt.  Pt verbalized understanding and agrees with this plan.  Pt is gracious for all the assistance and follow-up.

## 2016-07-20 ENCOUNTER — Encounter: Payer: Self-pay | Admitting: Physician Assistant

## 2016-07-20 ENCOUNTER — Ambulatory Visit (INDEPENDENT_AMBULATORY_CARE_PROVIDER_SITE_OTHER): Payer: Medicare Other | Admitting: Physician Assistant

## 2016-07-20 VITALS — BP 122/64 | HR 102 | Ht 69.0 in | Wt 278.8 lb

## 2016-07-20 DIAGNOSIS — I5032 Chronic diastolic (congestive) heart failure: Secondary | ICD-10-CM | POA: Diagnosis not present

## 2016-07-20 DIAGNOSIS — E785 Hyperlipidemia, unspecified: Secondary | ICD-10-CM | POA: Diagnosis not present

## 2016-07-20 DIAGNOSIS — I10 Essential (primary) hypertension: Secondary | ICD-10-CM | POA: Diagnosis not present

## 2016-07-20 DIAGNOSIS — E119 Type 2 diabetes mellitus without complications: Secondary | ICD-10-CM

## 2016-07-20 DIAGNOSIS — I2510 Atherosclerotic heart disease of native coronary artery without angina pectoris: Secondary | ICD-10-CM

## 2016-07-20 DIAGNOSIS — IMO0001 Reserved for inherently not codable concepts without codable children: Secondary | ICD-10-CM

## 2016-07-20 DIAGNOSIS — Z794 Long term (current) use of insulin: Secondary | ICD-10-CM

## 2016-07-20 DIAGNOSIS — I441 Atrioventricular block, second degree: Secondary | ICD-10-CM | POA: Diagnosis not present

## 2016-07-20 MED ORDER — FUROSEMIDE 20 MG PO TABS: 20.0000 mg | ORAL_TABLET | ORAL | 3 refills | Status: DC

## 2016-07-20 NOTE — Progress Notes (Signed)
Cardiology Office Note:    Date:  07/20/2016   ID:  Dwayne [name], DOB 11/26/1945, MRN [id number]  PCP:  Farris HasMORROW, AARON, MD  Cardiologist:  Dr. Tobias AlexanderKatarina Nelson   Electrophysiologist:  n/a Pulmonologist: Dr. Isaiah SergeMannam  Referring MD: Farris HasMorrow, Aaron, MD   Chief Complaint  Patient presents with  . Hospitalization Follow-up    symptomatic bradycardia    History of Present Illness:    Dwayne MaterDennis [name] is a 71 y.o. male with a hx of IDDM, HTN, HL, tobacco abuse, diastolic CHF, CAD s/p prior inferior MI in 1998 tx with stent to the RCA and subsequent PCI with stent to the Dx in 1999 and again with a stent to the RCA in 2012 for ISR.  [place] moved from [place] and established with Dr. Tobias AlexanderKatarina Nelson in 2014.  He had recurrent chest pain in 9/15 and a stress test was high risk.  He underwent LHC which demonstrated mod non-obstructive disease in the LAD, Dx an dLCx.  The Dx stent was patent.  There was 1 patent stent in the RCA and 1 stent with high grade ISR.  RCA was tx with a DES.    Last seen 07/14/16 with symptomatic bradycardia.  ECG demonstrated evidence of Mobitz 1.  He was admitted with plans for IV diuresis, observation and his beta-blocker was stopped.  However, the patient left AMA. He called into the office last week and was told to come in today for follow-up with me with an EKG.  He is here alone. Since leaving the hospital, he has been doing well. He denies chest discomfort, syncope, dizziness, weakness, orthopnea, PND. LE edema is improved. He has been more short of breath but is suffering from a URI. Otherwise, his breathing has been stable. He denies fever. His cough is productive of clear/white sputum. He denies any bleeding issues.  Prior CV studies:   The following studies were reviewed today:  LHC 10/15 LM patent LAD prox 50-75, Dx stent patent with 70 prox to stent LCx prox to mid 50-70 RI patent RCA prox stent patent, mid stent with 95 ISR EF 55 PCI: 3 x 18 mm Xience Alpine DES to  RCA  Myoview 9/15 EF 59, inferior ischemia, high risk  Echo 03/08/13 Mild LVH, EF 60-65, normal wall motion, normal diastolic function, calcified aortic valve, trivial MR, LA/RA upper limits of normal  Echo 4/14 [facility] [place] [facility] Normal wall motion, EF 55-60, mild concentric LVH, mild LAE  Past Medical History:  Diagnosis Date  . Anxiety   . Arthritis    "knees, elbows, hands" (02/12/2014)  . Compression fracture of L2 (HCC) 03/08/2013  . COPD (chronic obstructive pulmonary disease) (HCC)   . Coronary artery disease    Dr. Delton [facility]Nelson, [facility]- follows  . Depression   . Esophageal reflux   . Gallstones   . Hyperlipidemia   . Hypertension, benign    Benign  . 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 Myocardial infarction 1998 X 2   "probably a couple since 1998" (02/12/2014)  . Palpitations   . Pneumonia ~ 2004 X 1  . Swallowing difficulty    intermittent-occ"doesn't matter with solid or liquid"    Past Surgical History:  Procedure Laterality Date  . CARDIAC CATHETERIZATION  ~ 2012  . CATARACT EXTRACTION W/ INTRAOCULAR LENS IMPLANT Right 10/2013  . CHOLECYSTECTOMY N/A 03/07/2016   Procedure: LAPAROSCOPIC CHOLECYSTECTOMY WITH INTRAOPERATIVE CHOLANGIOGRAM;  Surgeon: Glenna FellowsBenjamin Hoxworth, MD;  Location: [facility];  Service: General;  Laterality: N/A;  . CORONARY ANGIOPLASTY  1998  . CORONARY ANGIOPLASTY WITH STENT PLACEMENT  1999; 2014; 02/12/2014   "1; 2; 1"  . LEFT HEART CATHETERIZATION WITH CORONARY ANGIOGRAM N/A 02/12/2014   Procedure: LEFT HEART CATHETERIZATION WITH CORONARY ANGIOGRAM;  Surgeon: Lesleigh Noe, MD;  Location: [facility] CATH LAB;  Service: Cardiovascular;  Laterality: N/A;    Current Medications: Current Meds  Medication Sig  . acetaminophen (TYLENOL) 500 MG tablet Take 500 mg by mouth every 6 (six) hours as needed for mild pain.  Marland Kitchen albuterol (PROVENTIL HFA;VENTOLIN HFA) 108 (90 BASE) MCG/ACT  inhaler Inhale 2 puffs into the lungs every 6 (six) hours as needed for wheezing.  Marland Kitchen aspirin 81 MG chewable tablet Chew 1 tablet (81 mg total) by mouth daily.  Marland Kitchen atorvastatin (LIPITOR) 80 MG tablet Take 80 mg by mouth daily at 6 PM.   . clonazePAM (KLONOPIN) 0.5 MG tablet Take 0.25 mg by mouth daily.   . clopidogrel (PLAVIX) 75 MG tablet TAKE ONE TABLET BY MOUTH ONCE DAILY  . GLIPIZIDE XL 10 MG 24 hr tablet Take 1 tablet by mouth 2 (two) times daily.  . hydrochlorothiazide (HYDRODIURIL) 25 MG tablet Take 25 mg by mouth daily.   . insulin glargine (LANTUS) 100 unit/mL SOPN Inject 16 Units into the skin at bedtime.   . isosorbide mononitrate (IMDUR) 30 MG 24 hr tablet TAKE ONE TABLET BY MOUTH ONCE DAILY  . lisinopril (PRINIVIL,ZESTRIL) 40 MG tablet Take 40 mg by mouth daily.  . metFORMIN (GLUCOPHAGE) 1000 MG tablet Take 1 tablet (1,000 mg total) by mouth 2 (two) times daily with a meal.  . nitroGLYCERIN (NITROSTAT) 0.4 MG SL tablet Place 0.4 mg under the tongue every 5 (five) minutes as needed for chest pain (x 3 doses).  . pantoprazole (PROTONIX) 40 MG tablet Take 1 tablet (40 mg total) by mouth daily.  . Probiotic Product (PROBIOTIC PO) Take 1 tablet by mouth daily.  . sertraline (ZOLOFT) 100 MG tablet Take 200 mg by mouth at bedtime.   . [DISCONTINUED] furosemide (LASIX) 20 MG tablet Take 1 tablet (20 mg total) by mouth daily.     Allergies:   Morphine and related   Social History   Social History  . Marital status: Married    Spouse name: N/A  . Number of children: N/A  . Years of education: N/A   Social History Main Topics  . Smoking status: Current Every Day Smoker    Packs/day: 0.50    Years: 50.00    Types: Cigarettes  . Smokeless tobacco: Never Used     Comment: Counseled on quitting smoking  . Alcohol use 0.0 oz/week     Comment: "I was a drinker; I haven't had a drink since 1990"  . Drug use: No  . Sexual activity: No   Other Topics Concern  . None   Social History  Narrative  . None     Family History  Problem Relation Age of Onset  . Heart attack Father   . Diabetes Father   . Stroke Neg Hx      ROS:   Please see the history of present illness.    Review of Systems  Cardiovascular: Positive for leg swelling.  Musculoskeletal: Positive for joint pain.   All other systems reviewed and are negative.   EKGs/Labs/Other Test Reviewed:    EKG:  EKG is  ordered today.  The ekg ordered today demonstrates Sinus tachycardia,  HR 102, long first-degree AV block  Recent Labs: 03/04/2016: ALT 12 07/14/2016: B Natriuretic Peptide 88.3; BUN 28; Creatinine, Ser 1.00; Hemoglobin 13.6; Platelets 227; Potassium 4.1; Sodium 137; TSH 1.620   Recent Lipid Panel No results found for: CHOL, TRIG, HDL, CHOLHDL, VLDL, LDLCALC, LDLDIRECT   Physical Exam:    VS:  BP 122/64   Pulse (!) 102   Ht 5\' 9"  (1.753 m)   Wt 278 lb 12.8 oz (126.5 kg)   BMI 41.17 kg/m     Wt Readings from Last 3 Encounters:  07/20/16 278 lb 12.8 oz (126.5 kg)  07/14/16 287 lb 0.6 oz (130.2 kg)  07/14/16 287 lb (130.2 kg)     Physical Exam  Constitutional: He is oriented to person, place, and time. He appears well-developed and well-nourished. No distress.  HENT:  Head: Normocephalic and atraumatic.  Eyes: No scleral icterus.  Neck: No JVD present.  Cardiovascular: Normal rate, regular rhythm and normal heart sounds.   No murmur heard. Pulmonary/Chest: Effort normal. He has no wheezes.  Abdominal: Soft. There is no tenderness.  Musculoskeletal: He exhibits no edema.  Neurological: He is alert and oriented to person, place, and time.  Skin: Skin is warm and dry.  Psychiatric: He has a normal mood and affect.    ASSESSMENT:    1. Mobitz I   2. Chronic diastolic CHF (congestive heart failure) (HCC)   3. Coronary artery disease involving native coronary artery of native heart without angina pectoris   4. Essential hypertension   5. Hyperlipidemia, unspecified hyperlipidemia  type   6. IDDM (insulin dependent diabetes mellitus) (HCC)    PLAN:    In order of problems listed above:  1. Mobitz I -  He was admitted to the hospital last week with symptoms of bradycardia with second-degree AV block type I on his ECG. He is off his beta blocker. Mobitz 1 is resolved. He is now tachycardic. This may be related to OTC cold medication usage.  I reviewed with Dr. Hillis Range.  No need for monitoring for now.  Patient should refrain from cold medications with decongestants.  I d/w patient.  FU in several weeks.  He knows to return sooner if he has recurrent symptoms of bradycardia.  2. Chronic diastolic CHF (congestive heart failure) (HCC) -  Volume appears stable.  He was previously on Lasix.  Will resume Lasix 20 mg every other day.  BMET in 2 weeks.   3. Coronary artery disease involving native coronary artery of native heart without angina pectoris -  S/p prior MI and multiple PCI procedures.  He denies chest pain on current regimen. Continue ASA, Plavix, statin, nitrates.  He is off of beta-blocker due to bradycardia.  4. Essential hypertension -  BP at goal here. It was higher at home.  Resume Lasix as noted.  He will call if BP remains above target.  5. Hyperlipidemia, unspecified hyperlipidemia type -  Continue statin.  6. IDDM (insulin dependent diabetes mellitus) (HCC) -  FU with PCP for management.   Dispo:  Return in about 8 weeks (around 09/14/2016) for Routine Follow Up with Dr. Delton See.   Medication Adjustments/Labs and Tests Ordered: Current medicines are reviewed at length with the patient today.  Concerns regarding medicines are outlined above.  Medication changes, Labs and Tests ordered today are outlined in the Patient Instructions noted below. Patient Instructions  Medication Instructions:  1. CHANGE LASIX TO 20 MG 1 TABLET EVERY-OTHER DAY  Labwork: BMET TO BE  DONE IN 2 WEEKS  Testing/Procedures: NONE  Follow-Up: DR. Delton See IN 6-8  WEEKS  Any Other Special Instructions Will Be Listed Below (If Applicable). 1. WEIGH DAILY AND IF WEIGHT GOES UP 3 LB'S IN 1 DAY THEN GO AHEAD AND TAKE AN EXTRA LASIX THAT     DAY 2. MONITOR BLOOD PRESSURE AND CALL THE OFFICE IF BP IS RUNNING 140/90 OR HIGHER [phone] If you need a refill on your cardiac medications before your next appointment, please call your pharmacy.  Signed, Tereso Newcomer, PA-C  07/20/2016 4:36 PM    [facility] [address] [place], [place], [place]  [address] Phone: [phone]; Fax: [phone]

## 2016-07-20 NOTE — Patient Instructions (Addendum)
Medication Instructions:  1. CHANGE LASIX TO 20 MG 1 TABLET EVERY-OTHER DAY  Labwork: BMET TO BE DONE IN 2 WEEKS  Testing/Procedures: NONE  Follow-Up: DR. Delton SeeNELSON IN 6-8 WEEKS  Any Other Special Instructions Will Be Listed Below (If Applicable). 1. WEIGH DAILY AND IF WEIGHT GOES UP 3 LB'S IN 1 DAY THEN GO AHEAD AND TAKE AN EXTRA LASIX THAT     DAY 2. MONITOR BLOOD PRESSURE AND CALL THE OFFICE IF BP IS RUNNING 140/90 OR HIGHER [phone] If you need a refill on your cardiac medications before your next appointment, please call your pharmacy.

## 2016-07-29 ENCOUNTER — Other Ambulatory Visit: Payer: Self-pay | Admitting: Cardiology

## 2016-08-02 ENCOUNTER — Other Ambulatory Visit (INDEPENDENT_AMBULATORY_CARE_PROVIDER_SITE_OTHER): Payer: Medicare Other

## 2016-08-02 DIAGNOSIS — I10 Essential (primary) hypertension: Secondary | ICD-10-CM | POA: Diagnosis not present

## 2016-08-03 ENCOUNTER — Telehealth: Payer: Self-pay | Admitting: *Deleted

## 2016-08-03 LAB — BASIC METABOLIC PANEL
BUN / CREAT RATIO: 17 (ref 10–24)
BUN: 18 mg/dL (ref 8–27)
CHLORIDE: 95 mmol/L — AB (ref 96–106)
CO2: 26 mmol/L (ref 18–29)
Calcium: 9.1 mg/dL (ref 8.6–10.2)
Creatinine, Ser: 1.09 mg/dL (ref 0.76–1.27)
GFR calc Af Amer: 79 mL/min/{1.73_m2} (ref >59)
GFR calc non Af Amer: 68 mL/min/{1.73_m2} (ref >59)
GLUCOSE: 228 mg/dL — AB (ref 65–99)
Potassium: 4.8 mmol/L (ref 3.5–5.2)
SODIUM: 137 mmol/L (ref 134–144)

## 2016-08-03 NOTE — Telephone Encounter (Signed)
Pt notified of lab results by phone with verbal understanding. Pt advised per Bing NeighborsScott W. PA to f/u with PCP about elevated glucose levels. Pt is agreeable to plan of care.

## 2016-09-21 ENCOUNTER — Ambulatory Visit: Payer: Medicare Other | Admitting: Cardiology

## 2016-09-21 ENCOUNTER — Ambulatory Visit: Payer: Medicare Other | Admitting: Physician Assistant

## 2016-10-05 ENCOUNTER — Ambulatory Visit: Payer: Medicare Other | Admitting: Physician Assistant

## 2016-10-05 DIAGNOSIS — I5032 Chronic diastolic (congestive) heart failure: Secondary | ICD-10-CM | POA: Insufficient documentation

## 2016-10-05 DIAGNOSIS — I441 Atrioventricular block, second degree: Secondary | ICD-10-CM | POA: Insufficient documentation

## 2016-10-05 NOTE — Progress Notes (Deleted)
Cardiology Office Note:    Date:  10/05/2016   ID:  Dwayne [name], DOB 05-06-1946, MRN [id number]  PCP:  Farris Has, MD  Cardiologist:  Dr. Tobias Alexander   Electrophysiologist:  n/a Pulmonologist: Dr. Isaiah Serge  Referring MD: Farris Has, MD   No chief complaint on file. ***  History of Present Illness:    Dwayne [name] is a 71 y.o. male with a hx of  IDDM, HTN, HL, tobacco abuse, diastolic CHF, CAD s/p prior inferior MI in 1998 tx with stent to the RCA and subsequent PCI with stent to the Dx in 1999 and again with a stent to the RCA in 2012 for ISR.  He moved from [place] and established with Dr. Tobias Alexander in 2014.  He had recurrent chest pain in 9/15 and a stress test was high risk.  He underwent LHC which demonstrated mod non-obstructive disease in the LAD, Dx an dLCx.  The Dx stent was patent.  There was 1 patent stent in the RCA and 1 stent with high grade ISR.  The RCA was tx with a DES.     He was admitted in 3/18 with volume overload and symptomatic bradycardia.  ECG demonstrated evidence of Mobitz 1.  He left the hospital AMA. He was last seen in follow up by me 07/20/16.  He was tachycardic at that time but had been taking OTC decongestants.      Dwayne [name] ***  Prior CV studies:   The following studies were reviewed today:  LHC 10/15 LM patent LAD prox 50-75, Dx stent patent with 70 prox to stent LCx prox to mid 50-70 RI patent RCA prox stent patent, mid stent with 95 ISR EF 55 PCI: 3 x 18 mm Xience Alpine DES to RCA   Myoview 9/15 EF 59, inferior ischemia, high risk   Echo 03/08/13 Mild LVH, EF 60-65, normal wall motion, normal diastolic function, calcified aortic valve, trivial MR, LA/RA upper limits of normal   Echo 4/14 [facility] [place] [facility]) Normal wall motion, EF 55-60, mild concentric LVH, mild LAE   Past Medical History:  Diagnosis Date  . Anxiety   . Arthritis    "knees, elbows, hands" (02/12/2014)  . Compression fracture of L2  (HCC) 03/08/2013  . COPD (chronic obstructive pulmonary disease) (HCC)   . Coronary artery disease    Dr. Delton See, [facility]- follows  . Depression   . Esophageal reflux   . Gallstones   . Hyperlipidemia   . Hypertension, benign    Benign  . 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 Myocardial infarction 1998 X 2   "probably a couple since 1998" (02/12/2014)  . Palpitations   . Pneumonia ~ 2004 X 1  . Swallowing difficulty    intermittent-occ"doesn't matter with solid or liquid"    Past Surgical History:  Procedure Laterality Date  . CARDIAC CATHETERIZATION  ~ 2012  . CATARACT EXTRACTION W/ INTRAOCULAR LENS IMPLANT Right 10/2013  . CHOLECYSTECTOMY N/A 03/07/2016   Procedure: LAPAROSCOPIC CHOLECYSTECTOMY WITH INTRAOPERATIVE CHOLANGIOGRAM;  Surgeon: Glenna Fellows, MD;  Location: [facility];  Service: General;  Laterality: N/A;  . CORONARY ANGIOPLASTY  1998  . CORONARY ANGIOPLASTY WITH STENT PLACEMENT  1999; 2014; 02/12/2014   "1; 2; 1"  . LEFT HEART CATHETERIZATION WITH CORONARY ANGIOGRAM N/A 02/12/2014   Procedure: LEFT HEART CATHETERIZATION WITH CORONARY ANGIOGRAM;  Surgeon: Lesleigh Noe, MD;  Location: [facility] CATH LAB;  Service: Cardiovascular;  Laterality: N/A;    Current Medications: No outpatient prescriptions have been marked as taking for the 10/05/16 encounter (Appointment) with Tereso NewcomerWeaver, Ainsleigh Kakos T, PA-C.     Allergies:   Morphine and related   Social History   Social History  . Marital status: Married    Spouse name: N/A  . Number of children: N/A  . Years of education: N/A   Social History Main Topics  . Smoking status: Current Every Day Smoker    Packs/day: 0.50    Years: 50.00    Types: Cigarettes  . Smokeless tobacco: Never Used     Comment: Counseled on quitting smoking  . Alcohol use 0.0 oz/week     Comment: "I was a drinker; I haven't had a drink since 1990"  . Drug use: No  . Sexual activity: No   Other  Topics Concern  . Not on file   Social History Narrative  . No narrative on file     Family Hx: The patient's family history includes Diabetes in his father; Heart attack in his father. There is no history of Stroke.  ROS:   Please see the history of present illness.    ROS All other systems reviewed and are negative.   EKGs/Labs/Other Test Reviewed:    EKG:  EKG is *** ordered today.  The ekg ordered today demonstrates ***  Recent Labs: 03/04/2016: ALT 12 07/14/2016: B Natriuretic Peptide 88.3; Hemoglobin 13.6; Platelets 227; TSH 1.620 08/02/2016: BUN 18; Creatinine, Ser 1.09; Potassium 4.8; Sodium 137   Recent Lipid Panel No results found for: CHOL, TRIG, HDL, CHOLHDL, VLDL, LDLCALC, LDLDIRECT   Physical Exam:    VS:  There were no vitals taken for this visit.    Wt Readings from Last 3 Encounters:  07/20/16 278 lb 12.8 oz (126.5 kg)  07/14/16 287 lb 0.6 oz (130.2 kg)  07/14/16 287 lb (130.2 kg)     ***Physical Exam  ASSESSMENT:    No diagnosis found. PLAN:    In order of problems listed above:  No diagnosis found.*** 1. Mobitz I -  He was admitted to the hospital last week with symptoms of bradycardia with second-degree AV block type I on his ECG. He is off his beta blocker. Mobitz 1 is resolved. He is now tachycardic. This may be related to OTC cold medication usage.  I reviewed with Dr. Hillis RangeJames Allred.  No need for monitoring for now.  Patient should refrain from cold medications with decongestants.  I d/w patient.  FU in several weeks.  He knows to return sooner if he has recurrent symptoms of bradycardia.   2. Chronic diastolic CHF (congestive heart failure) (HCC) -  Volume appears stable.  He was previously on Lasix.  Will resume Lasix 20 mg every other day.  BMET in 2 weeks.    3. Coronary artery disease involving native coronary artery of native heart without angina pectoris -  S/p prior MI and multiple PCI procedures.  He denies chest pain on current  regimen. Continue ASA, Plavix, statin, nitrates.  He is off of beta-blocker due to bradycardia.   4. Essential hypertension -  BP at goal here. It was higher at home.  Resume Lasix as noted.  He will call if BP remains above target.   5. Hyperlipidemia, unspecified hyperlipidemia type -  Continue statin.   6. IDDM (insulin dependent diabetes mellitus) (HCC) -  FU with PCP for management.   Dispo:  No Follow-up on file.   Medication Adjustments/Labs  and Tests Ordered: Current medicines are reviewed at length with the patient today.  Concerns regarding medicines are outlined above.  Orders/Tests:  No orders of the defined types were placed in this encounter.  Medication changes: No orders of the defined types were placed in this encounter.  Signed, Tereso Newcomer, PA-C  10/05/2016 10:33 AM    [facility] [address] [place], [place], [place]  [address] Phone: [phone]; Fax: [phone]

## 2016-11-16 ENCOUNTER — Other Ambulatory Visit: Payer: Self-pay | Admitting: Cardiology

## 2016-11-24 ENCOUNTER — Ambulatory Visit
Admission: RE | Admit: 2016-11-24 | Discharge: 2016-11-24 | Disposition: A | Discharge status: Home / Self Care | Payer: Medicare Other | Source: Ambulatory Visit | Attending: Acute Care | Admitting: Acute Care

## 2016-11-24 DIAGNOSIS — F17210 Nicotine dependence, cigarettes, uncomplicated: Secondary | ICD-10-CM

## 2016-11-28 ENCOUNTER — Other Ambulatory Visit: Payer: Self-pay | Admitting: Acute Care

## 2016-11-28 DIAGNOSIS — F17210 Nicotine dependence, cigarettes, uncomplicated: Secondary | ICD-10-CM

## 2017-07-28 ENCOUNTER — Other Ambulatory Visit: Payer: Self-pay | Admitting: Cardiology

## 2017-07-28 NOTE — Telephone Encounter (Signed)
Pt has no showed that last 2 follow-up appts. Please refill per policy and he has to schedule a follow-up appt to receive further refills thereafter.  Thanks!

## 2017-08-14 ENCOUNTER — Encounter: Payer: Self-pay | Admitting: Physician Assistant

## 2017-08-14 DIAGNOSIS — I700 Atherosclerosis of aorta: Secondary | ICD-10-CM

## 2017-08-14 HISTORY — DX: Atherosclerosis of aorta: I70.0

## 2017-08-14 NOTE — Progress Notes (Deleted)
Cardiology Office Note:    Date:  08/14/2017   ID:  Dwayne [name], DOB 14-Sep-1945, MRN [id number]  PCP:  Dwayne Has, MD  Cardiologist:  Dwayne Alexander, MD  Pulmonologist: Dwayne [name]  Referring MD: Dwayne Has, MD   No chief complaint on file. ***  History of Present Illness:    Dwayne [name] is a 72 y.o. male with IDDM, HTN, HL, tobacco abuse, diastolic CHF, CAD s/p prior inferior MI in 1998 tx with stent to the RCA and subsequent PCI with stent to the Dx in 1999 and again with a stent to the RCA in 2012 for ISR.  He moved from [place] and established with Dr. Tobias [name] in 2014.  He had recurrent chest pain in 9/15 and a stress test was high risk.  He underwent LHC which demonstrated mod non-obstructive disease in the LAD, Dx an dLCx.  The Dx stent was patent.  There was 1 patent stent in the RCA and 1 stent with high grade ISR.  RCA was tx with a DES.  He was admitted in 3/18 with volume excess and Mobitz 1.  Last seen in clinic in 3/18.    Dwayne [name] ***  Prior CV studies:   The following studies were reviewed today:  Chest CT 7/18 IMPRESSION: 1. Stable exam. Lung-RADS 2, benign appearance or behavior. Continue annual screening with low-dose chest CT without contrast in 12 months. 2.  Emphysema. (ICD10-J43.9) 3.  Aortic Atherosclerois (ICD10-170.0)  LHC 10/15 LM patent LAD prox 50-75, Dx stent patent with 70 prox to stent LCx prox to mid 50-70 RI patent RCA prox stent patent, mid stent with 95 ISR EF 55 PCI: 3 x 18 mm Xience Alpine DES to RCA  Myoview 9/15 EF 59, inferior ischemia, high risk  Echo 03/08/13 Mild LVH, EF 60-65, normal wall motion, normal diastolic function, calcified aortic valve, trivial MR, LA/RA upper limits of normal  Echo 4/14 [facility] [place] [facility]) Normal wall motion, EF 55-60, mild concentric LVH, mild LAE  Past Medical History:  Diagnosis Date  . Anxiety   . Aortic atherosclerosis (HCC) 08/14/2017  . Arthritis    "knees, elbows, hands" (02/12/2014)  . Compression fracture of L2 (HCC) 03/08/2013  . COPD (chronic obstructive pulmonary disease) (HCC)   . Coronary artery disease    Dwayne [name], [facility]- follows  . Depression   . Esophageal reflux   . Gallstones   . Hyperlipidemia   . Hypertension, benign    Benign  . 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 Myocardial infarction (HCC) 1998 X 2   "probably a couple since 1998" (02/12/2014)  . Palpitations   . Pneumonia ~ 2004 X 1  . Swallowing difficulty    intermittent-occ"doesn'[name] matter with solid or liquid"   Surgical Hx: The patient  [name] a past surgical history that includes Coronary angioplasty with stent (1999; 2014; 02/12/2014); Coronary angioplasty (1998); Cardiac catheterization (~ 2012); Cataract extraction w/ intraocular lens implant (Right, 10/2013); left heart catheterization with coronary angiogram (N/A, 02/12/2014); and Cholecystectomy (N/A, 03/07/2016).   Current Medications: No outpatient medications have been marked as taking for the 08/15/17 encounter (Appointment) with Dwayne Newcomer T, PA-C.     Allergies:   Morphine and related   Social History   Tobacco Use  . Smoking status: Current Every Day Smoker    Packs/day: 0.50    Years: 50.00    Pack years: 25.00    Types: Cigarettes  .  Smokeless tobacco: Never Used  . Tobacco comment: Counseled on quitting smoking  Substance Use Topics  . Alcohol use: Yes    Alcohol/week: 0.0 oz    Comment: "I was a drinker; I haven'[name] had a drink since 1990"  . Drug use: No     Family Hx: The patient's family history includes Diabetes in his father; Heart attack in his father. There is no history of Stroke.  ROS:   Please [name] the history of present illness.    ROS All other systems reviewed and are negative.   EKGs/Labs/Other Test Reviewed:    EKG:  EKG is *** ordered today.  The ekg ordered today demonstrates ***  Recent Labs: No  results found for requested labs within last 8760 hours.   Recent Lipid Panel No results found for: CHOL, TRIG, HDL, CHOLHDL, LDLCALC, LDLDIRECT  Physical Exam:    VS:  There were no vitals taken for this visit.    Wt Readings from Last 3 Encounters:  07/20/16 278 lb 12.8 oz (126.5 kg)  07/14/16 287 lb 0.6 oz (130.2 kg)  07/14/16 287 lb (130.2 kg)     ***Physical Exam  ASSESSMENT & PLAN:    Chronic diastolic CHF (congestive heart failure) (HCC)  Mobitz I  Coronary artery disease involving native coronary artery of native heart without angina pectoris  Essential hypertension  Hyperlipidemia, unspecified hyperlipidemia type  Aortic atherosclerosis (HCC)*** 1. Mobitz I -  He was admitted to the hospital last week with symptoms of bradycardia with second-degree AV block type I on his ECG. He is off his beta blocker. Mobitz 1 is resolved. He is now tachycardic. This may be related to OTC cold medication usage.  I reviewed with Dr. Hillis RangeJames [name].  No need for monitoring for now.  Patient should refrain from cold medications with decongestants.  I d/w patient.  FU in several weeks.  He knows to return sooner if he [name] recurrent symptoms of bradycardia.  2. Chronic diastolic CHF (congestive heart failure) (HCC) -  Volume appears stable.  He was previously on Lasix.  Will resume Lasix 20 mg every other day.  BMET in 2 weeks.   3. Coronary artery disease involving native coronary artery of native heart without angina pectoris -  S/p prior MI and multiple PCI procedures.  He denies chest pain on current regimen. Continue ASA, Plavix, statin, nitrates.  He is off of beta-blocker due to bradycardia.  4. Essential hypertension -  BP at goal here. It was higher at home.  Resume Lasix as noted.  He will call if BP remains above target.  5. Hyperlipidemia, unspecified hyperlipidemia type -  Continue statin.  6. IDDM (insulin dependent diabetes mellitus) (HCC) -  FU with PCP for  management.   Dispo:  No follow-ups on file.   Medication Adjustments/Labs and Tests Ordered: Current medicines are reviewed at length with the patient today.  Concerns regarding medicines are outlined above.  Tests Ordered: No orders of the defined types were placed in this encounter.  Medication Changes: No orders of the defined types were placed in this encounter.   Signed, Dwayne NewcomerScott Weaver, PA-C  08/14/2017 9:56 PM    [facility] [address], [place], [place]  [address] Phone: [phone](336) [phone]; Fax: [phone]

## 2017-08-15 ENCOUNTER — Other Ambulatory Visit: Payer: Self-pay | Admitting: Cardiology

## 2017-08-15 ENCOUNTER — Ambulatory Visit: Payer: Medicare Other | Admitting: Physician Assistant

## 2017-08-15 ENCOUNTER — Other Ambulatory Visit: Payer: Self-pay | Admitting: *Deleted

## 2017-08-15 MED ORDER — CLOPIDOGREL BISULFATE 75 MG PO TABS: 75.0000 mg | ORAL_TABLET | Freq: Every day | ORAL | 0 refills | Status: DC

## 2017-08-15 MED ORDER — ISOSORBIDE MONONITRATE ER 30 MG PO TB24: 30.0000 mg | ORAL_TABLET | Freq: Every day | ORAL | 0 refills | Status: DC

## 2017-08-15 NOTE — Telephone Encounter (Signed)
Returned pts call to let him know that his refills have been sent for his Imdur & Plavix, and that he would need to keep his f/u appt 09/12/17 with Dr. Delton SeeNelson for future refills. Pt verbalized understanding and thanked me for the call.

## 2017-08-15 NOTE — Telephone Encounter (Signed)
°*  STAT* If patient is at the pharmacy, call can be transferred to refill team.   1. Which medications need to be refilled? (please list name of each medication and dose if known) Clopidogrel and Isosorbide Mono  2. Which pharmacy/location (including street and city if local pharmacy) is medication to be sent to?[facility] [phone] 3. Do they need a 30 day or 90 day supply? 90 and refills

## 2017-09-12 ENCOUNTER — Encounter: Payer: Self-pay | Admitting: Physician Assistant

## 2017-09-12 ENCOUNTER — Ambulatory Visit: Payer: Medicare Other | Admitting: Physician Assistant

## 2017-09-12 VITALS — BP 138/70 | HR 91 | Ht 69.0 in | Wt 271.0 lb

## 2017-09-12 DIAGNOSIS — I5032 Chronic diastolic (congestive) heart failure: Secondary | ICD-10-CM

## 2017-09-12 DIAGNOSIS — I2510 Atherosclerotic heart disease of native coronary artery without angina pectoris: Secondary | ICD-10-CM

## 2017-09-12 DIAGNOSIS — E119 Type 2 diabetes mellitus without complications: Secondary | ICD-10-CM | POA: Diagnosis not present

## 2017-09-12 DIAGNOSIS — I700 Atherosclerosis of aorta: Secondary | ICD-10-CM

## 2017-09-12 DIAGNOSIS — Z794 Long term (current) use of insulin: Secondary | ICD-10-CM | POA: Diagnosis not present

## 2017-09-12 DIAGNOSIS — I10 Essential (primary) hypertension: Secondary | ICD-10-CM

## 2017-09-12 DIAGNOSIS — E785 Hyperlipidemia, unspecified: Secondary | ICD-10-CM

## 2017-09-12 DIAGNOSIS — IMO0001 Reserved for inherently not codable concepts without codable children: Secondary | ICD-10-CM

## 2017-09-12 DIAGNOSIS — I441 Atrioventricular block, second degree: Secondary | ICD-10-CM | POA: Diagnosis not present

## 2017-09-12 MED ORDER — ISOSORBIDE MONONITRATE ER 30 MG PO TB24: 30.0000 mg | ORAL_TABLET | Freq: Every day | ORAL | 3 refills | Status: DC

## 2017-09-12 NOTE — Progress Notes (Signed)
Cardiology Office Note:    Date:  09/12/2017   ID:  Dwayne [name], DOB 1945/08/24, MRN [id number]  PCP:  Georgianne Fick, MD  Cardiologist:  Tobias Alexander, MD  Pulmonologist: Dr. Isaiah Serge  Referring MD: Farris Has, MD   Chief Complaint  Patient presents with  . Follow-up    History of Present Illness:    Dwayne [name] is a 72 y.o. male with diabetes, hypertension, hyperlipidemia, tobacco abuse, diastolic heart failure, second-degree AV block type I, coronary artery disease status post inferior myocardial infarction in 1998 treated with stent to the RCA and subsequent PCI with stent to the diagonal in 1999 and stenting to the RCA in 2012 for in-stent restenosis.  He moved from [place] and established with Dr. Tobias Alexander in 2014.  He had recurrent chest pain in 9/15 and a stress test was high risk.  He underwent LHC which demonstrated mod non-obstructive disease in the LAD, Dx an dLCx.  The Dx stent was patent.  There was 1 patent stent in the RCA and 1 stent with high grade ISR.  RCA was tx with a DES.  Last year, he was admitted with symptomatic bradycardia and Mobitz 1.  His rate and rhythm improved after discontinuing beta-blocker.  Dwayne [name] returns for follow-up.  He is here alone.  He denies any chest discomfort consistent with his previous angina.  He does get short of breath with some activities.  This is overall stable without significant change.  He denies orthopnea, PND.  He has chronic lower extremity swelling without change.  He denies syncope.  He does occasionally get lightheaded.  He denies any bleeding issues.  He continues to smoke.  Prior CV studies:   The following studies were reviewed today:  Chest CT 11/24/2016 IMPRESSION: 1. Stable exam. Lung-RADS 2, benign appearance or behavior. Continue annual screening with low-dose chest CT without contrast in 12 months. 2.  Emphysema. (ICD10-J43.9) 3.  Aortic Atherosclerois (ICD10-170.0)  LHC 10/15 LM patent LAD  prox 50-75, Dx stent patent with 70 prox to stent LCx prox to mid 50-70 RI patent RCA prox stent patent, mid stent with 95 ISR EF 55 PCI: 3 x 18 mm Xience Alpine DES to RCA  Myoview 9/15 EF 59, inferior ischemia, high risk  Echo 03/08/13 Mild LVH, EF 60-65, normal wall motion, normal diastolic function, calcified aortic valve, trivial MR, LA/RA upper limits of normal  Echo 4/14 [facility] [place] physicians) Normal wall motion, EF 55-60, mild concentric LVH, mild LAE   Past Medical History:  Diagnosis Date  . Anxiety   . Aortic atherosclerosis (HCC) 08/14/2017  . Arthritis    "knees, elbows, hands" (02/12/2014)  . Compression fracture of L2 (HCC) 03/08/2013  . COPD (chronic obstructive pulmonary disease) (HCC)   . Coronary artery disease    Dr. Delton See, [facility]- follows  . Depression   . Esophageal reflux   . Gallstones   . Hyperlipidemia   . Hypertension, benign    Benign  . 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 Myocardial infarction (HCC) 1998 X 2   "probably a couple since 1998" (02/12/2014)  . Palpitations   . Pneumonia ~ 2004 X 1  . Swallowing difficulty    intermittent-occ"doesn't matter with solid or liquid"   Surgical Hx: The patient  has a past surgical history that includes Coronary angioplasty with stent (1999; 2014; 02/12/2014); Coronary angioplasty (1998); Cardiac catheterization (~ 2012); Cataract extraction w/ intraocular  lens implant (Right, 10/2013); left heart catheterization with coronary angiogram (N/A, 02/12/2014); and Cholecystectomy (N/A, 03/07/2016).   Current Medications: Current Meds  Medication Sig  . acetaminophen (TYLENOL) 500 MG tablet Take 500 mg by mouth every 6 (six) hours as needed for mild pain.  Marland Kitchen albuterol (PROVENTIL HFA;VENTOLIN HFA) 108 (90 BASE) MCG/ACT inhaler Inhale 2 puffs into the lungs every 6 (six) hours as needed for wheezing.  Marland Kitchen aspirin 81 MG chewable tablet Chew 1  tablet (81 mg total) by mouth daily.  Marland Kitchen atorvastatin (LIPITOR) 80 MG tablet Take 80 mg by mouth daily at 6 PM.   . clonazePAM (KLONOPIN) 0.5 MG tablet Take 0.25 mg by mouth daily.   . clopidogrel (PLAVIX) 75 MG tablet Take 1 tablet (75 mg total) by mouth daily. Call and schedule follow up appointment to receive further refills. Thanks. [phone]  . GLIPIZIDE XL 10 MG 24 hr tablet Take 1 tablet by mouth daily.   . hydrochlorothiazide (HYDRODIURIL) 25 MG tablet Take 25 mg by mouth daily.   . isosorbide mononitrate (IMDUR) 30 MG 24 hr tablet Take 1 tablet (30 mg total) by mouth daily.  Marland Kitchen lisinopril (PRINIVIL,ZESTRIL) 40 MG tablet Take 40 mg by mouth daily.  . metFORMIN (GLUCOPHAGE) 1000 MG tablet Take 1 tablet (1,000 mg total) by mouth 2 (two) times daily with a meal.  . nitroGLYCERIN (NITROSTAT) 0.4 MG SL tablet Place 0.4 mg under the tongue every 5 (five) minutes as needed for chest pain (x 3 doses).  . pantoprazole (PROTONIX) 40 MG tablet Take 1 tablet (40 mg total) by mouth daily.  . Probiotic Product (PROBIOTIC PO) Take 1 tablet by mouth daily.  . sertraline (ZOLOFT) 100 MG tablet Take 200 mg by mouth at bedtime.   . [DISCONTINUED] insulin glargine (LANTUS) 100 unit/mL SOPN Inject 16 Units into the skin at bedtime.      Allergies:   Morphine and related   Social History   Tobacco Use  . Smoking status: Current Every Day Smoker    Packs/day: 0.50    Years: 50.00    Pack years: 25.00    Types: Cigarettes  . Smokeless tobacco: Never Used  . Tobacco comment: Counseled on quitting smoking  Substance Use Topics  . Alcohol use: Yes    Alcohol/week: 0.0 oz    Comment: "I was a drinker; I haven't had a drink since 1990"  . Drug use: No     Family Hx: The patient's family history includes Diabetes in his father; Heart attack in his father. There is no history of Stroke.  ROS:   Please see the history of present illness.    ROS All other systems reviewed and are  negative.   EKGs/Labs/Other Test Reviewed:    EKG:  EKG is  ordered today.  The ekg ordered today demonstrates normal sinus rhythm, heart rate 80, left axis deviation, PR interval 206 ms, transient prolongation of PR interval (?  Mobitz type I), QTC 442 ms, similar to prior tracing  Recent Labs: No results found for requested labs within last 8760 hours.   From K[facility] Tool: Cholesterol, total 134.000 12/10/2015 HDL 53.000 12/10/2015 LDL 53.000 12/10/2015 Triglycerides 140.000 12/10/2015 A1C 7.300 05/08/2017 Hemoglobin 13.600 06/20/2017 Creatinine, Serum 0.930 06/20/2017 Potassium 4.600 06/20/2017 Magnesium N/D ALT (SGPT) 17.000 06/20/2017 TSH 1.620 07/14/2016 BNP 250.300 01/10/2014 INR 1.000 02/06/2014 Platelets 231.000 06/20/2017  Recent Lipid Panel No results found for: CHOL, TRIG, HDL, CHOLHDL, LDLCALC, LDLDIRECT  Physical Exam:    VS:  BP 138/70 (  BP Location: Right Arm, Patient Position: Sitting, Cuff Size: Normal)   Pulse 91   Ht  (1.753 m)   Wt 271 lb (122.9 kg)   SpO2 98%   BMI 40.02 kg/m     Wt Readings from Last 3 Encounters:  09/12/17 271 lb (122.9 kg)  07/20/16 278 lb 12.8 oz (126.5 kg)  07/14/16 287 lb 0.6 oz (130.2 kg)     Physical Exam  Constitutional: He is oriented to person, place, and time. He appears well-developed and well-nourished. No distress.  HENT:  Head: Normocephalic and atraumatic.  Neck: Neck supple.  Cardiovascular: Normal rate, regular rhythm, S1 normal and S2 normal.  No murmur heard. Pulmonary/Chest: Breath sounds normal. He has no rales.  Abdominal: Soft.  Musculoskeletal: He exhibits edema (trace-1+ bilat Leg swelling).  Neurological: He is alert and oriented to person, place, and time.  Skin: Skin is warm and dry.    ASSESSMENT & PLAN:    Chronic diastolic CHF (congestive heart failure) (HCC) EF 60-65 by echocardiogram in 2014.  He is NYHA 2 b.  Volume status is currently stable.  Continue current therapy.  Coronary artery disease  involving native coronary artery of native heart without angina pectoris History of myocardial infarction in 1998 treated with stent to the RCA and subsequent stent to the diagonal in 1999.  He underwent stenting to the RCA secondary to in-stent restenosis in 2012.  Most recent PCI was performed in October 2015 with a drug-eluting stent to the RCA.  He is doing well without angina.  Continue aspirin, clopidogrel, isosorbide, lisinopril.  He is no longer on beta-blocker therapy secondary to Mobitz 1.  Mobitz I For the most part, resolved after discontinuation of beta-blocker.  Aortic atherosclerosis (HCC) Continue aspirin, statin.  Essential hypertension The patient's blood pressure is controlled on his current regimen.  Continue current therapy.   Hyperlipidemia, unspecified hyperlipidemia type Managed by primary care.  Most recent lipid panel optimal per the patient.  IDDM (insulin dependent diabetes mellitus) (HCC) Continue follow-up with endocrinology and primary care.   Dispo:  No follow-ups on file.   Medication Adjustments/Labs and Tests Ordered: Current medicines are reviewed at length with the patient today.  Concerns regarding medicines are outlined above.  Tests Ordered: No orders of the defined types were placed in this encounter.  Medication Changes: No orders of the defined types were placed in this encounter.   Signed, Tereso Newcomer, PA-C  09/12/2017 3:14 PM    [facility] [address] [place], [place], [place]  [address] Phone: [phone]; Fax: [phone]

## 2017-09-12 NOTE — Patient Instructions (Signed)
Medication Instructions:  1. A REFILL HAS BEEN SENT IN FOR IMDUR   Labwork: NONE ORDERED TODAY  Testing/Procedures: NONE ORDERED TODAY  Follow-Up: Your physician wants you to follow-up in: 6 MONTHS WITH DR. Johnell Comings will receive a reminder letter in the mail two months in advance. If you don't receive a letter, please call our office to schedule the follow-up appointment.   Any Other Special Instructions Will Be Listed Below (If Applicable).     If you need a refill on your cardiac medications before your next appointment, please call your pharmacy.

## 2017-09-15 ENCOUNTER — Other Ambulatory Visit: Payer: Self-pay | Admitting: Cardiology

## 2017-09-25 ENCOUNTER — Other Ambulatory Visit: Payer: Self-pay | Admitting: Physician Assistant

## 2017-09-26 ENCOUNTER — Other Ambulatory Visit: Payer: Self-pay | Admitting: Physician Assistant

## 2017-09-26 MED ORDER — FUROSEMIDE 20 MG PO TABS: 20.0000 mg | ORAL_TABLET | ORAL | 3 refills | Status: DC

## 2017-09-29 ENCOUNTER — Emergency Department (HOSPITAL_COMMUNITY): Payer: Medicare Other

## 2017-09-29 ENCOUNTER — Other Ambulatory Visit: Payer: Self-pay

## 2017-09-29 ENCOUNTER — Inpatient Hospital Stay (HOSPITAL_COMMUNITY)
Admission: EM | Admit: 2017-09-29 | Discharge: 2017-10-13 | DRG: 064 | Disposition: A | Discharge status: Skilled Nursing Facility | Payer: Medicare Other | Attending: Neurology | Admitting: Neurology

## 2017-09-29 ENCOUNTER — Inpatient Hospital Stay (HOSPITAL_COMMUNITY): Payer: Medicare Other

## 2017-09-29 ENCOUNTER — Encounter (HOSPITAL_COMMUNITY): Payer: Self-pay | Admitting: Emergency Medicine

## 2017-09-29 DIAGNOSIS — I252 Old myocardial infarction: Secondary | ICD-10-CM | POA: Diagnosis not present

## 2017-09-29 DIAGNOSIS — Z9049 Acquired absence of other specified parts of digestive tract: Secondary | ICD-10-CM

## 2017-09-29 DIAGNOSIS — I6523 Occlusion and stenosis of bilateral carotid arteries: Secondary | ICD-10-CM | POA: Diagnosis present

## 2017-09-29 DIAGNOSIS — R4781 Slurred speech: Secondary | ICD-10-CM | POA: Diagnosis present

## 2017-09-29 DIAGNOSIS — I700 Atherosclerosis of aorta: Secondary | ICD-10-CM | POA: Diagnosis present

## 2017-09-29 DIAGNOSIS — R29810 Facial weakness: Secondary | ICD-10-CM | POA: Diagnosis present

## 2017-09-29 DIAGNOSIS — D72828 Other elevated white blood cell count: Secondary | ICD-10-CM | POA: Diagnosis present

## 2017-09-29 DIAGNOSIS — Z4659 Encounter for fitting and adjustment of other gastrointestinal appliance and device: Secondary | ICD-10-CM

## 2017-09-29 DIAGNOSIS — E876 Hypokalemia: Secondary | ICD-10-CM | POA: Diagnosis not present

## 2017-09-29 DIAGNOSIS — I639 Cerebral infarction, unspecified: Secondary | ICD-10-CM | POA: Diagnosis not present

## 2017-09-29 DIAGNOSIS — R1310 Dysphagia, unspecified: Secondary | ICD-10-CM | POA: Diagnosis present

## 2017-09-29 DIAGNOSIS — J449 Chronic obstructive pulmonary disease, unspecified: Secondary | ICD-10-CM | POA: Diagnosis present

## 2017-09-29 DIAGNOSIS — F17210 Nicotine dependence, cigarettes, uncomplicated: Secondary | ICD-10-CM

## 2017-09-29 DIAGNOSIS — Z7982 Long term (current) use of aspirin: Secondary | ICD-10-CM

## 2017-09-29 DIAGNOSIS — M545 Low back pain: Secondary | ICD-10-CM | POA: Diagnosis not present

## 2017-09-29 DIAGNOSIS — G9349 Other encephalopathy: Secondary | ICD-10-CM | POA: Diagnosis present

## 2017-09-29 DIAGNOSIS — E1165 Type 2 diabetes mellitus with hyperglycemia: Secondary | ICD-10-CM | POA: Diagnosis present

## 2017-09-29 DIAGNOSIS — E669 Obesity, unspecified: Secondary | ICD-10-CM | POA: Diagnosis not present

## 2017-09-29 DIAGNOSIS — I615 Nontraumatic intracerebral hemorrhage, intraventricular: Secondary | ICD-10-CM | POA: Diagnosis present

## 2017-09-29 DIAGNOSIS — E119 Type 2 diabetes mellitus without complications: Secondary | ICD-10-CM

## 2017-09-29 DIAGNOSIS — Z794 Long term (current) use of insulin: Secondary | ICD-10-CM | POA: Diagnosis not present

## 2017-09-29 DIAGNOSIS — R4701 Aphasia: Secondary | ICD-10-CM | POA: Diagnosis present

## 2017-09-29 DIAGNOSIS — J9601 Acute respiratory failure with hypoxia: Secondary | ICD-10-CM | POA: Diagnosis present

## 2017-09-29 DIAGNOSIS — E785 Hyperlipidemia, unspecified: Secondary | ICD-10-CM | POA: Diagnosis not present

## 2017-09-29 DIAGNOSIS — R531 Weakness: Secondary | ICD-10-CM | POA: Diagnosis not present

## 2017-09-29 DIAGNOSIS — I619 Nontraumatic intracerebral hemorrhage, unspecified: Secondary | ICD-10-CM | POA: Diagnosis present

## 2017-09-29 DIAGNOSIS — D72829 Elevated white blood cell count, unspecified: Secondary | ICD-10-CM | POA: Diagnosis not present

## 2017-09-29 DIAGNOSIS — H518 Other specified disorders of binocular movement: Secondary | ICD-10-CM | POA: Diagnosis present

## 2017-09-29 DIAGNOSIS — E871 Hypo-osmolality and hyponatremia: Secondary | ICD-10-CM | POA: Diagnosis not present

## 2017-09-29 DIAGNOSIS — I610 Nontraumatic intracerebral hemorrhage in hemisphere, subcortical: Principal | ICD-10-CM | POA: Diagnosis present

## 2017-09-29 DIAGNOSIS — I110 Hypertensive heart disease with heart failure: Secondary | ICD-10-CM | POA: Diagnosis present

## 2017-09-29 DIAGNOSIS — I5189 Other ill-defined heart diseases: Secondary | ICD-10-CM

## 2017-09-29 DIAGNOSIS — I10 Essential (primary) hypertension: Secondary | ICD-10-CM | POA: Diagnosis present

## 2017-09-29 DIAGNOSIS — I5032 Chronic diastolic (congestive) heart failure: Secondary | ICD-10-CM | POA: Diagnosis present

## 2017-09-29 DIAGNOSIS — G8929 Other chronic pain: Secondary | ICD-10-CM

## 2017-09-29 DIAGNOSIS — I69391 Dysphagia following cerebral infarction: Secondary | ICD-10-CM

## 2017-09-29 DIAGNOSIS — Z885 Allergy status to narcotic agent status: Secondary | ICD-10-CM

## 2017-09-29 DIAGNOSIS — I161 Hypertensive emergency: Secondary | ICD-10-CM

## 2017-09-29 DIAGNOSIS — N179 Acute kidney failure, unspecified: Secondary | ICD-10-CM | POA: Diagnosis not present

## 2017-09-29 DIAGNOSIS — Z931 Gastrostomy status: Secondary | ICD-10-CM | POA: Diagnosis not present

## 2017-09-29 DIAGNOSIS — G8191 Hemiplegia, unspecified affecting right dominant side: Secondary | ICD-10-CM | POA: Diagnosis present

## 2017-09-29 DIAGNOSIS — R29726 NIHSS score 26: Secondary | ICD-10-CM | POA: Diagnosis present

## 2017-09-29 DIAGNOSIS — R402433 Glasgow coma scale score 3-8, at hospital admission: Secondary | ICD-10-CM | POA: Diagnosis present

## 2017-09-29 DIAGNOSIS — I611 Nontraumatic intracerebral hemorrhage in hemisphere, cortical: Secondary | ICD-10-CM | POA: Diagnosis not present

## 2017-09-29 DIAGNOSIS — E873 Alkalosis: Secondary | ICD-10-CM | POA: Diagnosis not present

## 2017-09-29 DIAGNOSIS — E1140 Type 2 diabetes mellitus with diabetic neuropathy, unspecified: Secondary | ICD-10-CM | POA: Diagnosis present

## 2017-09-29 DIAGNOSIS — F329 Major depressive disorder, single episode, unspecified: Secondary | ICD-10-CM | POA: Diagnosis present

## 2017-09-29 DIAGNOSIS — Z6838 Body mass index (BMI) 38.0-38.9, adult: Secondary | ICD-10-CM

## 2017-09-29 DIAGNOSIS — Z8249 Family history of ischemic heart disease and other diseases of the circulatory system: Secondary | ICD-10-CM

## 2017-09-29 DIAGNOSIS — R402434 Glasgow coma scale score 3-8, 24 hours or more after hospital admission: Secondary | ICD-10-CM | POA: Diagnosis not present

## 2017-09-29 DIAGNOSIS — Z01818 Encounter for other preprocedural examination: Secondary | ICD-10-CM

## 2017-09-29 DIAGNOSIS — Z79899 Other long term (current) drug therapy: Secondary | ICD-10-CM

## 2017-09-29 DIAGNOSIS — J690 Pneumonitis due to inhalation of food and vomit: Secondary | ICD-10-CM | POA: Diagnosis not present

## 2017-09-29 DIAGNOSIS — F419 Anxiety disorder, unspecified: Secondary | ICD-10-CM | POA: Diagnosis present

## 2017-09-29 DIAGNOSIS — Z961 Presence of intraocular lens: Secondary | ICD-10-CM | POA: Diagnosis present

## 2017-09-29 DIAGNOSIS — Z7902 Long term (current) use of antithrombotics/antiplatelets: Secondary | ICD-10-CM

## 2017-09-29 DIAGNOSIS — E1159 Type 2 diabetes mellitus with other circulatory complications: Secondary | ICD-10-CM | POA: Diagnosis not present

## 2017-09-29 DIAGNOSIS — F32A Depression, unspecified: Secondary | ICD-10-CM | POA: Diagnosis present

## 2017-09-29 DIAGNOSIS — Z0189 Encounter for other specified special examinations: Secondary | ICD-10-CM

## 2017-09-29 DIAGNOSIS — I2510 Atherosclerotic heart disease of native coronary artery without angina pectoris: Secondary | ICD-10-CM | POA: Diagnosis present

## 2017-09-29 DIAGNOSIS — IMO0001 Reserved for inherently not codable concepts without codable children: Secondary | ICD-10-CM

## 2017-09-29 DIAGNOSIS — I6529 Occlusion and stenosis of unspecified carotid artery: Secondary | ICD-10-CM | POA: Diagnosis present

## 2017-09-29 DIAGNOSIS — Z955 Presence of coronary angioplasty implant and graft: Secondary | ICD-10-CM

## 2017-09-29 DIAGNOSIS — E1169 Type 2 diabetes mellitus with other specified complication: Secondary | ICD-10-CM

## 2017-09-29 DIAGNOSIS — I441 Atrioventricular block, second degree: Secondary | ICD-10-CM | POA: Diagnosis present

## 2017-09-29 DIAGNOSIS — Z833 Family history of diabetes mellitus: Secondary | ICD-10-CM

## 2017-09-29 DIAGNOSIS — Z7189 Other specified counseling: Secondary | ICD-10-CM

## 2017-09-29 DIAGNOSIS — R1312 Dysphagia, oropharyngeal phase: Secondary | ICD-10-CM | POA: Diagnosis not present

## 2017-09-29 DIAGNOSIS — I509 Heart failure, unspecified: Secondary | ICD-10-CM

## 2017-09-29 DIAGNOSIS — M150 Primary generalized (osteo)arthritis: Secondary | ICD-10-CM | POA: Diagnosis present

## 2017-09-29 DIAGNOSIS — Z8701 Personal history of pneumonia (recurrent): Secondary | ICD-10-CM

## 2017-09-29 DIAGNOSIS — J9600 Acute respiratory failure, unspecified whether with hypoxia or hypercapnia: Secondary | ICD-10-CM | POA: Diagnosis not present

## 2017-09-29 DIAGNOSIS — K219 Gastro-esophageal reflux disease without esophagitis: Secondary | ICD-10-CM | POA: Diagnosis present

## 2017-09-29 DIAGNOSIS — Z9841 Cataract extraction status, right eye: Secondary | ICD-10-CM

## 2017-09-29 DIAGNOSIS — R402422 Glasgow coma scale score 9-12, at arrival to emergency department: Secondary | ICD-10-CM | POA: Diagnosis present

## 2017-09-29 DIAGNOSIS — E870 Hyperosmolality and hypernatremia: Secondary | ICD-10-CM | POA: Diagnosis not present

## 2017-09-29 LAB — DIFFERENTIAL
ABS IMMATURE GRANULOCYTES: 0.1 10*3/uL (ref 0.0–0.1)
BASOS ABS: 0.1 10*3/uL (ref 0.0–0.1)
BASOS PCT: 1 %
EOS ABS: 0.1 10*3/uL (ref 0.0–0.7)
Eosinophils Relative: 1 %
IMMATURE GRANULOCYTES: 1 %
Lymphocytes Relative: 35 %
Lymphs Abs: 4.3 10*3/uL — ABNORMAL HIGH (ref 0.7–4.0)
Monocytes Absolute: 1 10*3/uL (ref 0.1–1.0)
Monocytes Relative: 8 %
NEUTROS PCT: 56 %
Neutro Abs: 7 10*3/uL (ref 1.7–7.7)

## 2017-09-29 LAB — I-STAT TROPONIN, ED: TROPONIN I, POC: 0.01 ng/mL (ref 0.00–0.08)

## 2017-09-29 LAB — COMPREHENSIVE METABOLIC PANEL
ALBUMIN: 4 g/dL (ref 3.5–5.0)
ALT: 17 U/L (ref 17–63)
ANION GAP: 13 (ref 5–15)
AST: 17 U/L (ref 15–41)
Alkaline Phosphatase: 62 U/L (ref 38–126)
BUN: 21 mg/dL — AB (ref 6–20)
CHLORIDE: 101 mmol/L (ref 101–111)
CO2: 24 mmol/L (ref 22–32)
CREATININE: 1.06 mg/dL (ref 0.61–1.24)
Calcium: 9.4 mg/dL (ref 8.9–10.3)
GFR calc Af Amer: >60 mL/min (ref >60)
GFR calc non Af Amer: >60 mL/min (ref >60)
GLUCOSE: 259 mg/dL — AB (ref 65–99)
POTASSIUM: 4 mmol/L (ref 3.5–5.1)
Sodium: 138 mmol/L (ref 135–145)
Total Bilirubin: 1 mg/dL (ref 0.3–1.2)
Total Protein: 7.8 g/dL (ref 6.5–8.1)

## 2017-09-29 LAB — POCT I-STAT 3, ART BLOOD GAS (G3+)
Acid-Base Excess: 1 mmol/L (ref 0.0–2.0)
Bicarbonate: 27.4 mmol/L (ref 20.0–28.0)
O2 Saturation: 100 %
PCO2 ART: 49.3 mmHg — AB (ref 32.0–48.0)
PH ART: 7.353 (ref 7.350–7.450)
PO2 ART: 493 mmHg — AB (ref 83.0–108.0)
Patient temperature: 98.6
TCO2: 29 mmol/L (ref 22–32)

## 2017-09-29 LAB — ETHANOL: Alcohol, Ethyl (B): <10 mg/dL (ref <10)

## 2017-09-29 LAB — GLUCOSE, CAPILLARY
GLUCOSE-CAPILLARY: 305 mg/dL — AB (ref 65–99)
Glucose-Capillary: 208 mg/dL — ABNORMAL HIGH (ref 65–99)
Glucose-Capillary: 222 mg/dL — ABNORMAL HIGH (ref 65–99)

## 2017-09-29 LAB — HEMOGLOBIN A1C
Hgb A1c MFr Bld: 8.7 % — ABNORMAL HIGH (ref 4.8–5.6)
Mean Plasma Glucose: 202.99 mg/dL

## 2017-09-29 LAB — CBC
HCT: 41.5 % (ref 39.0–52.0)
Hemoglobin: 14.3 g/dL (ref 13.0–17.0)
MCH: 32.6 pg (ref 26.0–34.0)
MCHC: 34.5 g/dL (ref 30.0–36.0)
MCV: 94.7 fL (ref 78.0–100.0)
PLATELETS: 290 10*3/uL (ref 150–400)
RBC: 4.38 MIL/uL (ref 4.22–5.81)
RDW: 13.2 % (ref 11.5–15.5)
WBC: 12.6 10*3/uL — AB (ref 4.0–10.5)

## 2017-09-29 LAB — TRIGLYCERIDES: TRIGLYCERIDES: 240 mg/dL — AB (ref <150)

## 2017-09-29 LAB — I-STAT CG4 LACTIC ACID, ED: LACTIC ACID, VENOUS: 2.35 mmol/L — AB (ref 0.5–1.9)

## 2017-09-29 LAB — MRSA PCR SCREENING: MRSA by PCR: NEGATIVE

## 2017-09-29 LAB — APTT: APTT: 27 s (ref 24–36)

## 2017-09-29 LAB — PROTIME-INR
INR: 0.97
Prothrombin Time: 12.8 seconds (ref 11.4–15.2)

## 2017-09-29 MED ORDER — IOPAMIDOL (ISOVUE-370) INJECTION 76%
INTRAVENOUS | Status: AC
  Filled 2017-09-29: qty 50

## 2017-09-29 MED ORDER — SODIUM CHLORIDE 0.9 % IV SOLN
20.0000 ug | Freq: Once | INTRAVENOUS | Status: AC
  Administered 2017-09-29: 20 ug via INTRAVENOUS
  Filled 2017-09-29: qty 5

## 2017-09-29 MED ORDER — ACETAMINOPHEN 325 MG PO TABS: 650.0000 mg | ORAL_TABLET | ORAL | Status: DC | PRN

## 2017-09-29 MED ORDER — ONDANSETRON HCL 4 MG/2ML IJ SOLN
4.0000 mg | Freq: Once | INTRAMUSCULAR | Status: AC
  Administered 2017-09-29: 4 mg via INTRAVENOUS

## 2017-09-29 MED ORDER — NICARDIPINE HCL IN NACL 20-0.86 MG/200ML-% IV SOLN
INTRAVENOUS | Status: AC
  Filled 2017-09-29: qty 200

## 2017-09-29 MED ORDER — IPRATROPIUM-ALBUTEROL 0.5-2.5 (3) MG/3ML IN SOLN
3.0000 mL | Freq: Four times a day (QID) | RESPIRATORY_TRACT | Status: DC
Start: 2017-09-29 — End: 2017-10-04
  Administered 2017-09-29 – 2017-10-04 (×21): 3 mL via RESPIRATORY_TRACT
  Filled 2017-09-29 (×21): qty 3

## 2017-09-29 MED ORDER — INSULIN ASPART 100 UNIT/ML ~~LOC~~ SOLN
0.0000 [IU] | SUBCUTANEOUS | Status: DC
  Administered 2017-09-29 (×2): 5 [IU] via SUBCUTANEOUS
  Administered 2017-09-29: 11 [IU] via SUBCUTANEOUS
  Administered 2017-09-30 (×3): 3 [IU] via SUBCUTANEOUS
  Administered 2017-09-30 (×3): 5 [IU] via SUBCUTANEOUS
  Administered 2017-10-01: 8 [IU] via SUBCUTANEOUS
  Administered 2017-10-01: 3 [IU] via SUBCUTANEOUS
  Administered 2017-10-01 – 2017-10-02 (×4): 5 [IU] via SUBCUTANEOUS
  Administered 2017-10-02: 8 [IU] via SUBCUTANEOUS
  Administered 2017-10-02: 5 [IU] via SUBCUTANEOUS
  Administered 2017-10-02: 3 [IU] via SUBCUTANEOUS
  Administered 2017-10-02: 8 [IU] via SUBCUTANEOUS
  Administered 2017-10-02: 5 [IU] via SUBCUTANEOUS
  Administered 2017-10-02: 8 [IU] via SUBCUTANEOUS
  Administered 2017-10-03 (×2): 3 [IU] via SUBCUTANEOUS
  Administered 2017-10-03 (×3): 8 [IU] via SUBCUTANEOUS
  Administered 2017-10-04: 3 [IU] via SUBCUTANEOUS
  Administered 2017-10-04: 5 [IU] via SUBCUTANEOUS
  Administered 2017-10-04: 3 [IU] via SUBCUTANEOUS
  Administered 2017-10-04: 5 [IU] via SUBCUTANEOUS
  Administered 2017-10-04 (×2): 3 [IU] via SUBCUTANEOUS
  Administered 2017-10-05 – 2017-10-06 (×9): 8 [IU] via SUBCUTANEOUS
  Administered 2017-10-06: 3 [IU] via SUBCUTANEOUS
  Administered 2017-10-06: 8 [IU] via SUBCUTANEOUS
  Administered 2017-10-07 (×3): 11 [IU] via SUBCUTANEOUS
  Administered 2017-10-07: 5 [IU] via SUBCUTANEOUS
  Administered 2017-10-07: 11 [IU] via SUBCUTANEOUS
  Administered 2017-10-07 – 2017-10-08 (×2): 8 [IU] via SUBCUTANEOUS
  Administered 2017-10-08: 11 [IU] via SUBCUTANEOUS
  Administered 2017-10-08 (×3): 8 [IU] via SUBCUTANEOUS
  Administered 2017-10-08: 11 [IU] via SUBCUTANEOUS
  Administered 2017-10-09: 3 [IU] via SUBCUTANEOUS
  Administered 2017-10-09: 8 [IU] via SUBCUTANEOUS
  Administered 2017-10-09: 3 [IU] via SUBCUTANEOUS
  Administered 2017-10-09: 8 [IU] via SUBCUTANEOUS
  Administered 2017-10-09: 5 [IU] via SUBCUTANEOUS
  Administered 2017-10-10: 3 [IU] via SUBCUTANEOUS
  Administered 2017-10-10: 2 [IU] via SUBCUTANEOUS
  Administered 2017-10-10: 5 [IU] via SUBCUTANEOUS
  Administered 2017-10-10: 3 [IU] via SUBCUTANEOUS
  Administered 2017-10-10 – 2017-10-11 (×2): 2 [IU] via SUBCUTANEOUS
  Administered 2017-10-11 (×2): 5 [IU] via SUBCUTANEOUS
  Administered 2017-10-11 (×2): 3 [IU] via SUBCUTANEOUS
  Administered 2017-10-12 (×4): 2 [IU] via SUBCUTANEOUS
  Administered 2017-10-12 – 2017-10-13 (×2): 3 [IU] via SUBCUTANEOUS
  Administered 2017-10-13 (×3): 2 [IU] via SUBCUTANEOUS

## 2017-09-29 MED ORDER — MIDAZOLAM HCL 2 MG/2ML IJ SOLN
INTRAMUSCULAR | Status: AC
  Filled 2017-09-29: qty 2

## 2017-09-29 MED ORDER — LIDOCAINE HCL (PF) 2 % IJ SOLN
75.0000 mg | Freq: Once | INTRAMUSCULAR | Status: AC
  Administered 2017-09-29: 76 mg

## 2017-09-29 MED ORDER — STROKE: EARLY STAGES OF RECOVERY BOOK
Freq: Once | Status: AC
  Administered 2017-10-01: 1
  Filled 2017-09-29 (×2): qty 1

## 2017-09-29 MED ORDER — ORAL CARE MOUTH RINSE
15.0000 mL | Freq: Four times a day (QID) | OROMUCOSAL | Status: DC
  Administered 2017-09-29 – 2017-10-02 (×14): 15 mL via OROMUCOSAL

## 2017-09-29 MED ORDER — FENTANYL CITRATE (PF) 100 MCG/2ML IJ SOLN: 50.0000 ug | INTRAMUSCULAR | Status: DC | PRN

## 2017-09-29 MED ORDER — CHLORHEXIDINE GLUCONATE 0.12% ORAL RINSE (MEDLINE KIT)
15.0000 mL | Freq: Two times a day (BID) | OROMUCOSAL | Status: DC
  Administered 2017-09-29 – 2017-10-04 (×11): 15 mL via OROMUCOSAL

## 2017-09-29 MED ORDER — PROPOFOL 1000 MG/100ML IV EMUL
0.0000 ug/kg/min | INTRAVENOUS | Status: DC
  Administered 2017-09-29 (×4): 50 ug/kg/min via INTRAVENOUS
  Administered 2017-09-30 (×2): 40 ug/kg/min via INTRAVENOUS
  Administered 2017-09-30: 15 ug/kg/min via INTRAVENOUS
  Administered 2017-09-30: 25 ug/kg/min via INTRAVENOUS
  Administered 2017-10-01: 15 ug/kg/min via INTRAVENOUS
  Administered 2017-10-01: 25 ug/kg/min via INTRAVENOUS
  Administered 2017-10-01: 20 ug/kg/min via INTRAVENOUS
  Administered 2017-10-01: 25 ug/kg/min via INTRAVENOUS
  Administered 2017-10-02: 10 ug/kg/min via INTRAVENOUS
  Administered 2017-10-02: 25 ug/kg/min via INTRAVENOUS
  Filled 2017-09-29 (×15): qty 100

## 2017-09-29 MED ORDER — FENTANYL CITRATE (PF) 100 MCG/2ML IJ SOLN
50.0000 ug | INTRAMUSCULAR | Status: DC | PRN
  Administered 2017-09-30 – 2017-10-02 (×2): 50 ug via INTRAVENOUS
  Filled 2017-09-29 (×2): qty 2

## 2017-09-29 MED ORDER — ONDANSETRON HCL 4 MG/2ML IJ SOLN
INTRAMUSCULAR | Status: AC
  Filled 2017-09-29: qty 2

## 2017-09-29 MED ORDER — ROCURONIUM BROMIDE 50 MG/5ML IV SOLN
70.0000 mg | Freq: Once | INTRAVENOUS | Status: AC
  Administered 2017-09-29: 70 mg via INTRAVENOUS

## 2017-09-29 MED ORDER — PROPOFOL 1000 MG/100ML IV EMUL
INTRAVENOUS | Status: AC
  Administered 2017-09-29: 50 ug/kg/min via INTRAVENOUS
  Filled 2017-09-29: qty 100

## 2017-09-29 MED ORDER — IOPAMIDOL (ISOVUE-370) INJECTION 76%: 50.0000 mL | Freq: Once | INTRAVENOUS | Status: DC | PRN

## 2017-09-29 MED ORDER — NICARDIPINE HCL IN NACL 20-0.86 MG/200ML-% IV SOLN
0.0000 mg/h | INTRAVENOUS | Status: DC
  Administered 2017-09-29 (×2): 5 mg/h via INTRAVENOUS
  Administered 2017-09-30 (×5): 10 mg/h via INTRAVENOUS
  Administered 2017-10-01: 5 mg/h via INTRAVENOUS
  Administered 2017-10-01 (×3): 7.5 mg/h via INTRAVENOUS
  Administered 2017-10-01: 5 mg/h via INTRAVENOUS
  Filled 2017-09-29 (×2): qty 200
  Filled 2017-09-29: qty 400
  Filled 2017-09-29 (×9): qty 200

## 2017-09-29 MED ORDER — IOPAMIDOL (ISOVUE-300) INJECTION 61%
50.0000 mL | Freq: Once | INTRAVENOUS | Status: AC
  Administered 2017-09-29: 50 mL via INTRAVENOUS

## 2017-09-29 MED ORDER — FAMOTIDINE IN NACL 20-0.9 MG/50ML-% IV SOLN: 20.0000 mg | Freq: Two times a day (BID) | INTRAVENOUS | Status: DC

## 2017-09-29 MED ORDER — BISACODYL 10 MG RE SUPP: 10.0000 mg | Freq: Every day | RECTAL | Status: DC | PRN

## 2017-09-29 MED ORDER — DOCUSATE SODIUM 50 MG/5ML PO LIQD: 100.0000 mg | Freq: Two times a day (BID) | ORAL | Status: DC | PRN

## 2017-09-29 MED ORDER — SENNOSIDES-DOCUSATE SODIUM 8.6-50 MG PO TABS
1.0000 | ORAL_TABLET | Freq: Two times a day (BID) | ORAL | Status: DC
  Administered 2017-09-30 – 2017-10-11 (×19): 1 via ORAL
  Filled 2017-09-29 (×21): qty 1

## 2017-09-29 MED ORDER — PROPOFOL 1000 MG/100ML IV EMUL
5.0000 ug/kg/min | INTRAVENOUS | Status: DC
  Administered 2017-09-29: 50 ug/kg/min via INTRAVENOUS

## 2017-09-29 MED ORDER — FAMOTIDINE IN NACL 20-0.9 MG/50ML-% IV SOLN
20.0000 mg | Freq: Two times a day (BID) | INTRAVENOUS | Status: DC
  Administered 2017-09-29 – 2017-10-05 (×12): 20 mg via INTRAVENOUS
  Filled 2017-09-29 (×12): qty 50

## 2017-09-29 MED ORDER — LABETALOL HCL 5 MG/ML IV SOLN
20.0000 mg | Freq: Once | INTRAVENOUS | Status: AC
  Administered 2017-09-29: 20 mg via INTRAVENOUS

## 2017-09-29 MED ORDER — ACETAMINOPHEN 160 MG/5ML PO SOLN
650.0000 mg | ORAL | Status: DC | PRN
  Administered 2017-09-29 – 2017-10-09 (×5): 650 mg
  Filled 2017-09-29 (×5): qty 20.3

## 2017-09-29 MED ORDER — ACETAMINOPHEN 650 MG RE SUPP: 650.0000 mg | RECTAL | Status: DC | PRN

## 2017-09-29 MED ORDER — ETOMIDATE 2 MG/ML IV SOLN
40.0000 mg | Freq: Once | INTRAVENOUS | Status: AC
  Administered 2017-09-29: 40 mg via INTRAVENOUS

## 2017-09-29 MED ORDER — FENTANYL CITRATE (PF) 100 MCG/2ML IJ SOLN
INTRAMUSCULAR | Status: AC
  Filled 2017-09-29: qty 2

## 2017-09-29 NOTE — Code Documentation (Signed)
72 yo Male coming from home with his wife reports that he started to complain of not being able to get up at around 1000. EMS was called and when they arrived, pt was noted to have right sided weakness. During transport, Pt started to have right facial droop, decreased level of consciousness and signs of aphasia. Stroke Team met patient at the door. Pt noted to have Initial NIHSS 26 due to being drowsy, inability to answer questions, patient followed one command, right facial droop, inability to move the right arm and right leg, mute, and dysarthric with some extinction to the right side. Pt has left sided gaze with completed hemianopia of the right visual field. CT showed ICH. Pt had no IV access and had some delays of medications due to IV access. Pt had IV placed and given 20 mg of labetalol to help with BP. Pharmacy delivered Cardene for patient control. Cardene started at 1102 to decrease BP. Pt monitored and Cardene titrated when appropriate. CTA completed and patient admitted to [facility]. Handoff given to Judson Roch, Therapist, sports.

## 2017-09-29 NOTE — ED Notes (Addendum)
Cleaned pt and Changed pt's linens and gown. Pt's wife given pt's clothing and medications

## 2017-09-29 NOTE — Consult Note (Signed)
PULMONARY / CRITICAL CARE MEDICINE   Name: Dwayne [name] MRN: [id number] DOB: May 12, 1945    ADMISSION DATE:  09/29/2017 CONSULTATION DATE:  5/24  REFERRING MD: [email or web address]  CHIEF COMPLAINT: Critical care management in setting of hemorrhagic stroke  HISTORY OF PRESENT ILLNESS:   This is a 72 year old male patient who reported to the emergency room on 5/24 with new right-sided weakness and inability to stand initially noted approximately 10 AM that morning.  He has a significant history of COPD, prior coronary artery disease on aspirin and Plavix, diabetes type 2, hypertension, and hyperlipidemia.  On EMS arrival his systolic blood pressure was in the 200s, noted to have decreased level of consciousness to point where his Glasgow Coma Scale was noted at 9 upon arrival to the ER.  A CT of head was obtained that showed a large left basal ganglial/thalamic intracerebral hemorrhage with intraventricular extension the ICH score was 4.  He is being admitted to the neurology service for intracerebral hemorrhage, pulmonary asked to evaluate and assist with critical care management.  PAST MEDICAL HISTORY :  He  has a past medical history of Anxiety, Aortic atherosclerosis (HCC) (08/14/2017), Arthritis, Compression fracture of L2 (HCC) (03/08/2013), COPD (chronic obstructive pulmonary disease) (HCC), Coronary artery disease, Depression, Esophageal reflux, Gallstones, Hyperlipidemia, Hypertension, benign, IDDM (insulin dependent diabetes mellitus) (HCC), Myocardial infarction (HCC) (1998 X 2), Palpitations, Pneumonia (~ 2004 X 1), and Swallowing difficulty.  PAST SURGICAL HISTORY: He  has a past surgical history that includes Coronary angioplasty with stent (1999; 2014; 02/12/2014); Coronary angioplasty (1998); Cardiac catheterization (~ 2012); Cataract extraction w/ intraocular lens implant (Right, 10/2013); left heart catheterization with coronary angiogram (N/A, 02/12/2014); and Cholecystectomy (N/A,  03/07/2016).  Allergies  Allergen Reactions  . Morphine And Related Swelling    Swelling of the tongue and face    No current facility-administered medications on file prior to encounter.    Current Outpatient Medications on File Prior to Encounter  Medication Sig  . acetaminophen (TYLENOL) 500 MG tablet Take 500 mg by mouth every 6 (six) hours as needed for mild pain.  Marland Kitchen albuterol (PROVENTIL HFA;VENTOLIN HFA) 108 (90 BASE) MCG/ACT inhaler Inhale 2 puffs into the lungs every 6 (six) hours as needed for wheezing.  Marland Kitchen aspirin 81 MG chewable tablet Chew 1 tablet (81 mg total) by mouth daily.  Marland Kitchen atorvastatin (LIPITOR) 80 MG tablet Take 80 mg by mouth daily at 6 PM.   . clonazePAM (KLONOPIN) 0.5 MG tablet Take 0.25 mg by mouth daily.   . clopidogrel (PLAVIX) 75 MG tablet Take 1 tablet (75 mg total) by mouth daily.  . furosemide (LASIX) 20 MG tablet Take 1 tablet (20 mg total) by mouth every other day.  Marland Kitchen GLIPIZIDE XL 10 MG 24 hr tablet Take 1 tablet by mouth daily.   . hydrochlorothiazide (HYDRODIURIL) 25 MG tablet Take 25 mg by mouth daily.   . isosorbide mononitrate (IMDUR) 30 MG 24 hr tablet Take 1 tablet (30 mg total) by mouth daily.  Marland Kitchen lisinopril (PRINIVIL,ZESTRIL) 40 MG tablet Take 40 mg by mouth daily.  . metFORMIN (GLUCOPHAGE) 1000 MG tablet Take 1 tablet (1,000 mg total) by mouth 2 (two) times daily with a meal.  . nitroGLYCERIN (NITROSTAT) 0.4 MG SL tablet Place 0.4 mg under the tongue every 5 (five) minutes as needed for chest pain (x 3 doses).  . pantoprazole (PROTONIX) 40 MG tablet Take 1 tablet (40 mg total) by mouth daily.  . Probiotic Product (PROBIOTIC PO) Take 1 tablet by mouth  daily.  . sertraline (ZOLOFT) 100 MG tablet Take 200 mg by mouth at bedtime.     FAMILY HISTORY:  His indicated that his mother is deceased. He indicated that his father is deceased. He indicated that the status of his neg hx is unknown.   SOCIAL HISTORY: He  reports that he has been smoking  cigarettes.  He has a 25.00 pack-year smoking history. He has never used smokeless tobacco. He reports that he drinks alcohol. He reports that he does not use drugs.  REVIEW OF SYSTEMS:   Not able   SUBJECTIVE:  Not able to report pain or shortness of breath   VITAL SIGNS: Blood Pressure 126/68   Pulse 75   Temperature 97.9 F (36.6 C) (Oral)   Respiration 18   Weight 274 lb 7.6 oz (124.5 kg)   Oxygen Saturation 93%   Body Mass Index 40.53 kg/m   HEMODYNAMICS:    VENTILATOR SETTINGS:    INTAKE / OUTPUT: No intake/output data recorded.  PHYSICAL EXAMINATION: General:  72 year old male awake but nods off and not able speak  Neuro:  Awake, sleepy, nods off quickly. Sp slurred and only one or 2 word phrases. Right sided facial droop and right sided paralysis  HEENT:  Right facial droop MMM Cardiovascular:  RRR  Lungs:  Clear but no sig cough mechanics  Abdomen:  Soft not tender + bowel sounds  Musculoskeletal:  Equal st and bulk LE edema  Skin:  Warm and dry   LABS:  BMET Recent Labs  Lab 09/29/17 1053  NA 138  K 4.0  CL 101  CO2 24  BUN 21*  CREATININE 1.06  GLUCOSE 259*    Electrolytes Recent Labs  Lab 09/29/17 1053  CALCIUM 9.4    CBC Recent Labs  Lab 09/29/17 1053  WBC 12.6*  HGB 14.3  HCT 41.5  PLT 290    Coag's Recent Labs  Lab 09/29/17 1053  APTT 27  INR 0.97    Sepsis Markers Recent Labs  Lab 09/29/17 1056  LATICACIDVEN 2.35*    ABG No results for input(s): PHART, PCO2ART, PO2ART in the last 168 hours.  Liver Enzymes Recent Labs  Lab 09/29/17 1053  AST 17  ALT 17  ALKPHOS 62  BILITOT 1.0  ALBUMIN 4.0    Cardiac Enzymes No results for input(s): TROPONINI, PROBNP in the last 168 hours.  Glucose No results for input(s): GLUCAP in the last 168 hours.  Imaging Ct Head Code Stroke Wo Contrast  Result Date: 09/29/2017 CLINICAL DATA:  Code stroke.  Right-sided facial droop. EXAM: CT HEAD WITHOUT CONTRAST  TECHNIQUE: Contiguous axial images were obtained from the base of the skull through the vertex without intravenous contrast. COMPARISON:  None. FINDINGS: Brain: Acute left basal ganglia hemorrhage measures 2.5 x 4.2 x 2.0 cm. Estimated volume is 88 cm^3 interventricular hemorrhage scratched at the hemorrhage extends into the left lateral ventricle. There is early dilation of the left lateral ventricle. Midline shift at the foramen of Monro is 4 mm. Hemorrhage extends into the foramen of Monro. There is no hemorrhage in the fourth ventricle or aqueduct. Periventricular white matter changes are present bilaterally. No other focal parenchymal infarct is present. Brainstem and cerebellum are normal. Vascular: Atherosclerotic calcifications are present within the cavernous internal carotid arteries bilaterally. There is no hyperdense vessel. Skull: Calvarium is intact. No focal lytic or blastic lesions are present. Sinuses/Orbits: The paranasal sinuses and the mastoid air cells are clear. Bilateral lens replacements are  present. Globes and orbits are otherwise unremarkable. IMPRESSION: 1. 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 Hemorrhage measures 2.5 x 4.2 x 2.0 cm (estimated volume is 88 cm^3). 2. Intraventricular extension of hemorrhage into the left lateral ventricle to the foramen of Monro. There is early dilation of the left lateral ventricle concerning for developing hydrocephalus. 3. 4 mm left right midline shift. Critical Value/emergent results were called by telephone at the time of interpretation on 09/29/2017 at 11:13 am to Dr. Criss Alvine , who verbally acknowledged these results. Electronically Signed   By: Marin Roberts M.D.   On: 09/29/2017 11:15     STUDIES:   CT 5/24: 1. 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 Hemorrhage measures 2.5 x 4.2 x 2.0 cm (estimated volume is 88 cm^3). 2. Intraventricular extension of hemorrhage into the left lateral ventricle to the foramen  of Monro. There is early dilation of the left lateral ventricle concerning for developing hydrocephalus. 3. 4 mm left right midline shift.  CT angio 5/24: 1. No acute or focal vascular lesion to explain the intracranial hemorrhage. 2. Atherosclerotic changes in the cavernous internal carotid arteries bilaterally with moderate stenoses relative to the more distal ICA termini, right greater than left. 3. Asymmetric narrowing of the distal left ICA lumen below the skull base suggesting a significant proximal stenosis. 4. No significant change in the parenchymal hemorrhage or intraventricular component.  CULTURES:   ANTIBIOTICS:   SIGNIFICANT EVENTS:   LINES/TUBES:   DISCUSSION: Large left basal ganglial, thalamic intracerebral hemorrhage.  Airway.  Will intubate, continue supportive care  ASSESSMENT / PLAN:  Acute large left thalamic and BG ICH Plan Admit to Neuro-ICU Serial neuro checks Systolic blood pressure goal less than 140 but greater than 100 Ensure normal PCO2 Protect airway Further imaging per neuro service  Acute respiratory failure in the setting of inability to protect airway H/o COPD  He has limited cough and poor upper airway control plan Intubate ventilate PAD protocol  Scheduled BDs RAS goal -1 Cont serial neuro checks  H/o CAD and HTN Plan Cont tele BP goals as above  DM w/ hyperglycemia Plan ssi protocol   GERD PLan H2B  FAMILY  - Updates:   - Inter-disciplinary family meet or Palliative Care meeting due by:  5/31  DVT prophylaxis: scd SUP: H2B  Diet: NPO Activity: BR Disposition : ICU  Simonne Martinet ACNP-BC [facility] Pulmonary/Critical Care Pager # [phone] OR # [phone] if no answer  09/29/2017, 12:10 PM

## 2017-09-29 NOTE — ED Triage Notes (Signed)
Pt arrives as Code stroke via EMS. LSN 10am. Pt told his family he was having trouble getting out of bed. Pt's family called EMS. Pt reported right arm weakness. After EMS arrived, pt was able to talk and follow commands, but right arm was weak. EMS reports that pt progressively worsened- slurred speech, right arm became flaccid and pt became nonverbal. Upon arrival, pt diaphoretic, nonverbal, moaning, right arm flaccid with facial droop.

## 2017-09-29 NOTE — ED Provider Notes (Signed)
[place] EMERGENCY DEPARTMENT Provider Note   CSN: [id number] Arrival date & time: 09/29/17  1045  LEVEL 5 CAVEAT - ACUITY OF SITUATION/APHASIA   History   Chief Complaint Chief Complaint  Patient presents with  . Code Stroke    HPI Dwayne [name] is a 72 y.o. male.  HPI  72 year old male brought in by EMS for worsening weakness.  Last seen normal at 10 AM.  Now having progressive right-sided weakness and has been having mental status changes and slurred speech since EMS arrival.  Otherwise, history is very limited and the patient is unable to provide history.  Past Medical History:  Diagnosis Date  . Anxiety   . Aortic atherosclerosis ([place]) 08/14/2017  . Arthritis    "knees, elbows, hands" (02/12/2014)  . Compression fracture of L2 ([place]) 03/08/2013  . COPD (chronic obstructive pulmonary disease) ([place])   . Coronary artery disease    Dr. Meda Coffee, [facility]- follows  . Depression   . Esophageal reflux   . Gallstones   . Hyperlipidemia   . Hypertension, benign    Benign  . 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 Myocardial infarction ([place]) 1998 X 2   "probably a couple since 1998" (02/12/2014)  . Palpitations   . Pneumonia ~ 2004 X 1  . Swallowing difficulty    intermittent-occ"doesn't matter with solid or liquid"    Patient Active Problem List   Diagnosis Date Noted  . ICH (intracerebral hemorrhage) ([place]) 09/29/2017  . Aortic atherosclerosis ([place]) 08/14/2017  . Chronic diastolic CHF (congestive heart failure) ([place]) 10/05/2016  . [place] I 10/05/2016  . Dizziness 07/14/2016  . Symptomatic bradycardia 07/14/2016  . Cholelithiasis and cholecystitis without obstruction 03/07/2016  . Diabetic neuropathy ([place]) 07/30/2014  . Adjustment disorder with depressed mood 02/24/2014  . Acid reflux 02/24/2014  . Diabetes mellitus, type 2 ([place]) 02/24/2014  . 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 Abnormal nuclear stress test 02/12/2014  . Hx of class III angina pectoris 02/12/2014  . History of class III angina pectoris 02/12/2014  . Lumbar vertebral fracture ([place]) 03/07/2013  . Angioedema 03/07/2013  . Depressive disorder, not elsewhere classified 02/25/2013  . Coronary artery disease involving native coronary artery of native heart without angina pectoris 01/30/2013  . Essential hypertension 01/30/2013  . IDDM (insulin dependent diabetes mellitus) ([place]) 01/30/2013  . Medication refill 01/30/2013  . Chest pain 08/03/2012  . Other reasons for seeking consultation 04/01/2011  . Anxiety 07/27/2010  . History of myocardial infarction 06/29/2010  . Presence of stent in coronary artery 06/29/2010  . Advance directive discussed with patient 06/04/2010  . Blood in the urine 01/17/2006  . HLD (hyperlipidemia) 01/17/2006  . Awareness of heartbeats 01/17/2006    Past Surgical History:  Procedure Laterality Date  . CARDIAC CATHETERIZATION  ~ 2012  . CATARACT EXTRACTION W/ INTRAOCULAR LENS IMPLANT Right 10/2013  . CHOLECYSTECTOMY N/A 03/07/2016   Procedure: LAPAROSCOPIC CHOLECYSTECTOMY WITH INTRAOPERATIVE CHOLANGIOGRAM;  Surgeon: Excell Seltzer, MD;  Location: [facility] ORS;  Service: General;  Laterality: N/A;  . [place]  . CORONARY ANGIOPLASTY WITH STENT PLACEMENT  1999; 2014; 02/12/2014   "1; 2; 1"  . LEFT HEART CATHETERIZATION WITH CORONARY ANGIOGRAM N/A 02/12/2014   Procedure: LEFT HEART CATHETERIZATION WITH CORONARY ANGIOGRAM;  Surgeon: Sinclair Grooms, MD;  Location: [facility] CATH LAB;  Service: Cardiovascular;  Laterality: N/A;        Home Medications    Prior to Admission medications   Medication Sig Start Date End Date Taking? Authorizing Provider  acetaminophen (TYLENOL) 500 MG tablet Take 500 mg by mouth every 6 (six) hours as needed for mild pain.    [provider]  albuterol (PROVENTIL  HFA;VENTOLIN HFA) 108 (90 BASE) MCG/ACT inhaler Inhale 2 puffs into the lungs every 6 (six) hours as needed for wheezing.    [provider]  aspirin 81 MG chewable tablet Chew 1 tablet (81 mg total) by mouth daily. 02/13/14   Lyda Jester M, PA-C  atorvastatin (LIPITOR) 80 MG tablet Take 80 mg by mouth daily at 6 PM.     [provider]  clonazePAM (KLONOPIN) 0.5 MG tablet Take 0.25 mg by mouth daily.  01/30/13   Dorothy Spark, MD  clopidogrel (PLAVIX) 75 MG tablet Take 1 tablet (75 mg total) by mouth daily. 09/15/17   Dorothy Spark, MD  furosemide (LASIX) 20 MG tablet Take 1 tablet (20 mg total) by mouth every other day. 09/26/17   Richardson Dopp T, PA-C  GLIPIZIDE XL 10 MG 24 hr tablet Take 1 tablet by mouth daily.  02/17/16   [provider]  hydrochlorothiazide (HYDRODIURIL) 25 MG tablet Take 25 mg by mouth daily.  12/16/15   [provider]  isosorbide mononitrate (IMDUR) 30 MG 24 hr tablet Take 1 tablet (30 mg total) by mouth daily. 09/12/17   Richardson Dopp T, PA-C  lisinopril (PRINIVIL,ZESTRIL) 40 MG tablet Take 40 mg by mouth daily.    [provider]  metFORMIN (GLUCOPHAGE) 1000 MG tablet Take 1 tablet (1,000 mg total) by mouth 2 (two) times daily with a meal. 02/16/14   Lyda Jester M, PA-C  nitroGLYCERIN (NITROSTAT) 0.4 MG SL tablet Place 0.4 mg under the tongue every 5 (five) minutes as needed for chest pain (x 3 doses).    [provider]  pantoprazole (PROTONIX) 40 MG tablet Take 1 tablet (40 mg total) by mouth daily. 03/11/13   Geradine Girt, DO  Probiotic Product (PROBIOTIC PO) Take 1 tablet by mouth daily.    [provider]  sertraline (ZOLOFT) 100 MG tablet Take 200 mg by mouth at bedtime.     [provider]    Family History Family History  Problem Relation Age of Onset  . Heart attack Father   . Diabetes Father   . Stroke Neg Hx     Social History Social History   Tobacco Use  .  Smoking status: Current Every Day Smoker    Packs/day: 0.50    Years: 50.00    Pack years: 25.00    Types: Cigarettes  . Smokeless tobacco: Never Used  . Tobacco comment: Counseled on quitting smoking  Substance Use Topics  . Alcohol use: Yes    Alcohol/week: 0.0 oz    Comment: "I was a drinker; I haven't had a drink since 1990"  . Drug use: No     Allergies   Morphine and related   Review of Systems Review of Systems  Unable to perform ROS: Mental status change     Physical Exam Updated Vital Signs BP (!) 124/58   Pulse 86   Temp 97.9 F (36.6 C) (Oral)   Resp 18   Ht '5\' 10"'  (1.778 m)   Wt 124.5 kg (274 lb 7.6 oz)   SpO2 99%   BMI 39.38 kg/m   Physical Exam  Constitutional: He appears well-developed and well-nourished.  HENT:  Head: Normocephalic and atraumatic.  Right Ear: External ear normal.  Left Ear: External ear normal.  Nose: Nose normal.  Eyes: Right eye exhibits no discharge. Left eye exhibits no discharge.  Neck: Neck supple.  Cardiovascular: Normal rate, regular rhythm and normal heart sounds.  Pulmonary/Chest: Effort normal and breath sounds normal.  Abdominal: Soft. There is no tenderness.  Musculoskeletal: He exhibits no edema.  Neurological: He is alert.  Alert, tracks to the left but does not respond verbally. Right hemiparesis. Moves left leg to command. Does not move left arm but if I lift it he will hold it in air.  Skin: Skin is warm. He is diaphoretic.  Nursing note and vitals reviewed.    ED Treatments / Results  Labs (all labs ordered are listed, but only abnormal results are displayed) Labs Reviewed  CBC - Abnormal; Notable for the following components:      Result Value   WBC 12.6 (*)    All other components within normal limits  DIFFERENTIAL - Abnormal; Notable for the following components:   Lymphs Abs 4.3 (*)    All other components within normal limits  COMPREHENSIVE METABOLIC PANEL - Abnormal; Notable for the  following components:   Glucose, Bld 259 (*)    BUN 21 (*)    All other components within normal limits  HEMOGLOBIN A1C - Abnormal; Notable for the following components:   Hgb A1c MFr Bld 8.7 (*)    All other components within normal limits  TRIGLYCERIDES - Abnormal; Notable for the following components:   Triglycerides 240 (*)    All other components within normal limits  I-STAT CG4 LACTIC ACID, ED - Abnormal; Notable for the following components:   Lactic Acid, Venous 2.35 (*)    All other components within normal limits  POCT I-STAT 3, ART BLOOD GAS (G3+) - Abnormal; Notable for the following components:   pCO2 arterial 49.3 (*)    pO2, Arterial 493.0 (*)    All other components within normal limits  MRSA PCR SCREENING  ETHANOL  PROTIME-INR  APTT  RAPID URINE DRUG SCREEN, HOSP PERFORMED  URINALYSIS, ROUTINE W REFLEX MICROSCOPIC  BLOOD GAS, ARTERIAL  I-STAT CHEM 8, ED  I-STAT TROPONIN, ED    EKG EKG Interpretation  Date/Time:  Friday Sep 29 2017 11:01:11 EDT Ventricular Rate:  65 PR Interval:    QRS Duration: 103 QT Interval:  427 QTC Calculation: 444 R Axis:   -39 Text Interpretation:  Ectopic atrial rhythm Short PR interval Probable left atrial enlargement Inferior infarct, old no significant change since mar 2018 Confirmed by Sherwood Gambler [phone]) on 09/29/2017 11:51:01 AM   Radiology Ct Angio Head W Or Wo Contrast  Result Date: 09/29/2017 CLINICAL DATA:  Acute left basal ganglia hemorrhage with extension into the left lateral ventricle. EXAM: CT ANGIOGRAPHY HEAD TECHNIQUE: Multidetector CT imaging of the head was performed using the standard protocol during bolus administration of intravenous contrast. Multiplanar CT image reconstructions and MIPs were obtained to evaluate the vascular anatomy. CONTRAST:  8m ISOVUE-300 IOPAMIDOL (ISOVUE-300) INJECTION 61% COMPARISON:  CT head without contrast from the same day. FINDINGS: CTA HEAD Anterior circulation: Dense  atherosclerotic calcifications are present within the cavernous internal carotid arteries bilaterally. There is tortuosity of the internal carotid arteries below the skull base. The distal cervical ICA lumen is narrowed on the left compared to the right. Atherosclerotic calcifications narrow the right cavernous ICA lumen to less than 1 mm.  The lumen of the left internal carotid artery is slightly larger than on the right. The ICA terminus is within normal limits bilaterally. The A1 and M1 segments are normal. The anterior communicating artery is patent. MCA bifurcations are intact. Diffuse small vessel irregularity is present in the ACA and MCA branch vessels without a significant proximal stenosis or occlusion. There are no aneurysms. No vascular malformation is present. Posterior circulation: The left vertebral artery is slightly dominant to the right. PICA origins are visualized and normal. Basilar artery is normal. Both posterior cerebral arteries originate from the basilar tip. There is some attenuation of distal PCA branch vessels without a significant proximal stenosis or occlusion. There is no aneurysm. Venous sinuses: Dural sinuses are patent. Straight sinus deep cerebral veins are intact. Anatomic variants: None IMPRESSION: 1. No acute or focal vascular lesion to explain the intracranial hemorrhage. 2. Atherosclerotic changes in the cavernous internal carotid arteries bilaterally with moderate stenoses relative to the more distal ICA termini, right greater than left. 3. Asymmetric narrowing of the distal left ICA lumen below the skull base suggesting a significant proximal stenosis. 4. No significant change in the parenchymal hemorrhage or intraventricular component. Electronically Signed   By: San Morelle M.D.   On: 09/29/2017 12:23   Ct Head Code Stroke Wo Contrast  Result Date: 09/29/2017 CLINICAL DATA:  Code stroke.  Right-sided facial droop. EXAM: CT HEAD WITHOUT CONTRAST TECHNIQUE:  Contiguous axial images were obtained from the base of the skull through the vertex without intravenous contrast. COMPARISON:  None. FINDINGS: Brain: Acute left basal ganglia hemorrhage measures 2.5 x 4.2 x 2.0 cm. Estimated volume is 88 cm^3 interventricular hemorrhage scratched at the hemorrhage extends into the left lateral ventricle. There is early dilation of the left lateral ventricle. Midline shift at the foramen of Monro is 4 mm. Hemorrhage extends into the foramen of Monro. There is no hemorrhage in the fourth ventricle or aqueduct. Periventricular white matter changes are present bilaterally. No other focal parenchymal infarct is present. Brainstem and cerebellum are normal. Vascular: Atherosclerotic calcifications are present within the cavernous internal carotid arteries bilaterally. There is no hyperdense vessel. Skull: Calvarium is intact. No focal lytic or blastic lesions are present. Sinuses/Orbits: The paranasal sinuses and the mastoid air cells are clear. Bilateral lens replacements are present. Globes and orbits are otherwise unremarkable. IMPRESSION: 1. 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 Hemorrhage measures 2.5 x 4.2 x 2.0 cm (estimated volume is 88 cm^3). 2. Intraventricular extension of hemorrhage into the left lateral ventricle to the foramen of Monro. There is early dilation of the left lateral ventricle concerning for developing hydrocephalus. 3. 4 mm left right midline shift. Critical Value/emergent results were called by telephone at the time of interpretation on 09/29/2017 at 11:13 am to Dr. Regenia Skeeter , who verbally acknowledged these results. Electronically Signed   By: San Morelle M.D.   On: 09/29/2017 11:15    Procedures .Critical Care Performed by: Sherwood Gambler, MD Authorized by: Sherwood Gambler, MD   Critical care provider statement:    Critical care time (minutes):  30   Critical care time was exclusive of:  Separately billable procedures and  treating other patients   Critical care was necessary to treat or prevent imminent or life-threatening deterioration of the following conditions:  CNS failure or compromise and respiratory failure   Critical care was time spent personally by me on the following activities:  Development of treatment plan with patient or surrogate, discussions with consultants, evaluation  of patient's response to treatment, examination of patient, obtaining history from patient or surrogate, ordering and performing treatments and interventions, ordering and review of laboratory studies, ordering and review of radiographic studies, pulse oximetry, re-evaluation of patient's condition and review of old charts   (including critical care time)  Medications Ordered in ED Medications  niCARdipine in saline (CARDENE-IV) 20-0.86 MG/200ML-% infusion SOLN (  Not Given 09/29/17 1317)  ondansetron (ZOFRAN) 4 MG/2ML injection (has no administration in time range)   stroke: mapping our early stages of recovery book (has no administration in time range)  acetaminophen (TYLENOL) solution 650 mg (has no administration in time range)    Or  acetaminophen (TYLENOL) suppository 650 mg (has no administration in time range)  senna-docusate (Senokot-S) tablet 1 tablet (1 tablet Oral Not Given 09/29/17 1108)  famotidine (PEPCID) IVPB 20 mg premix (20 mg Intravenous Not Given 09/29/17 1342)  nicardipine (CARDENE) 107m in 0.86% saline 2074mIV infusion (0.1 mg/ml) (5 mg/hr Intravenous Rate/Dose Verify 09/29/17 1600)  iopamidol (ISOVUE-370) 76 % injection (has no administration in time range)  iopamidol (ISOVUE-370) 76 % injection 50 mL (has no administration in time range)  fentaNYL (SUBLIMAZE) 100 MCG/2ML injection (  Not Given 09/29/17 1317)  midazolam (VERSED) 2 MG/2ML injection (  Not Given 09/29/17 1318)  insulin aspart (novoLOG) injection 0-15 Units (11 Units Subcutaneous Given 09/29/17 1536)  ipratropium-albuterol (DUONEB) 0.5-2.5 (3)  MG/3ML nebulizer solution 3 mL (3 mLs Nebulization Given 09/29/17 1533)  chlorhexidine gluconate (MEDLINE KIT) (PERIDEX) 0.12 % solution 15 mL (15 mLs Mouth Rinse Given 09/29/17 1354)  MEDLINE mouth rinse (15 mLs Mouth Rinse Given 09/29/17 1524)  propofol (DIPRIVAN) 1000 MG/100ML infusion (50 mcg/kg/min  124.5 kg Intravenous Rate/Dose Verify 09/29/17 1600)  fentaNYL (SUBLIMAZE) injection 50 mcg (has no administration in time range)  fentaNYL (SUBLIMAZE) injection 50 mcg (has no administration in time range)  docusate (COLACE) 50 MG/5ML liquid 100 mg (has no administration in time range)  bisacodyl (DULCOLAX) suppository 10 mg (has no administration in time range)  labetalol (NORMODYNE,TRANDATE) injection 20 mg (20 mg Intravenous Given 09/29/17 1058)  ondansetron (ZOFRAN) injection 4 mg (4 mg Intravenous Given 09/29/17 1115)  iopamidol (ISOVUE-300) 61 % injection 50 mL (50 mLs Intravenous Contrast Given 09/29/17 1130)  propofol (DIPRIVAN) 1000 MG/100ML infusion (  Stopped 09/29/17 1334)  etomidate (AMIDATE) injection 40 mg (40 mg Intravenous Given 09/29/17 1326)  rocuronium (ZEMURON) injection 70 mg (70 mg Intravenous Given 09/29/17 1326)  lidocaine (XYLOCAINE) 2 % injection 76 mg (76 mg Other Given 09/29/17 1325)  desmopressin (DDAVP) 20 mcg in sodium chloride 0.9 % 50 mL IVPB (0 mcg Intravenous Stopped 09/29/17 1556)     Initial Impression / Assessment and Plan / ED Course  I have reviewed the triage vital signs and the nursing notes.  Pertinent labs & imaging results that were available during my care of the patient were reviewed by me and considered in my medical decision making (see chart for details).     Patient presents as a code stroke with worsening symptoms.  CT confirms intracranial hemorrhage.  He is on Plavix.  He is currently protecting his airway although this is tenuous.  He had multiple rechecks but still is maintaining airway and neurology and I agree that while he may get intubated,  he does not needed emergently in the ED at this time.  He is going to the ICU.  He was placed on Cardene drip and his blood pressure has significantly responded and is much  improved.  Final Clinical Impressions(s) / ED Diagnoses   Final diagnoses:  Left-sided nontraumatic intracerebral hemorrhage, unspecified cerebral location [facility])    ED Discharge Orders    None       Sherwood Gambler, MD 09/29/17 1616

## 2017-09-29 NOTE — Procedures (Signed)
Endotracheal intubation  Indication: Decreased mental status in the setting of a large basal ganglia hemorrhage with intraventricular extension.  Technique: The patient was preoxygenated with an Ambu bag and administered 75 mg of IV lidocaine.  This was followed by 40 mg of etomidate, the larger dose chosen due to to suspicion of elevated ICP.  75 mg of rocuronium was then administered and the vocal cords were easily visualized with a 4 Miller blade.  A 7.5 endotracheal tube was passed to 23 cm at the lip.  There was positive CO2, there was symmetric air movement and SPO2 following intubation was 100%.  Chest x-ray is pending.

## 2017-09-29 NOTE — Consult Note (Signed)
Reason for Consult: Intracerebral hemorrhage Referring Physician: Neurology  Dwayne [name] is an 72 y.o. male.  HPI: 72 year old male admitted with hypertensive dominant hemisphere basal ganglia hemorrhage extending into his thalamus with some extension into his left lateral ventricle.  Serial CT scans demonstrate some increased volume of hemorrhage into his parasylvian region on the left.  No evidence of significant increase in intraventricular blood.  No evidence of obstructive hydrocephalus.  Past Medical History:  Diagnosis Date  . Anxiety   . Aortic atherosclerosis ([place]) 08/14/2017  . Arthritis    "knees, elbows, hands" (02/12/2014)  . Compression fracture of L2 ([place]) 03/08/2013  . COPD (chronic obstructive pulmonary disease) ([place])   . Coronary artery disease    Dr. Meda Coffee, [facility]- follows  . Depression   . Esophageal reflux   . Gallstones   . Hyperlipidemia   . Hypertension, benign    Benign  . 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 Myocardial infarction ([place]) 1998 X 2   "probably a couple since 1998" (02/12/2014)  . Palpitations   . Pneumonia ~ 2004 X 1  . Swallowing difficulty    intermittent-occ"doesn't matter with solid or liquid"    Past Surgical History:  Procedure Laterality Date  . CARDIAC CATHETERIZATION  ~ 2012  . CATARACT EXTRACTION W/ INTRAOCULAR LENS IMPLANT Right 10/2013  . CHOLECYSTECTOMY N/A 03/07/2016   Procedure: LAPAROSCOPIC CHOLECYSTECTOMY WITH INTRAOPERATIVE CHOLANGIOGRAM;  Surgeon: Excell Seltzer, MD;  Location: [facility];  Service: General;  Laterality: N/A;  . [place]  . CORONARY ANGIOPLASTY WITH STENT PLACEMENT  1999; 2014; 02/12/2014   "1; 2; 1"  . LEFT HEART CATHETERIZATION WITH CORONARY ANGIOGRAM N/A 02/12/2014   Procedure: LEFT HEART CATHETERIZATION WITH CORONARY ANGIOGRAM;  Surgeon: Sinclair Grooms, MD;  Location: [facility] CATH LAB;  Service: Cardiovascular;  Laterality: N/A;     Family History  Problem Relation Age of Onset  . Heart attack Father   . Diabetes Father   . Stroke Neg Hx     Social History:  reports that he has been smoking cigarettes.  He has a 25.00 pack-year smoking history. He has never used smokeless tobacco. He reports that he drinks alcohol. He reports that he does not use drugs.  Allergies:  Allergies  Allergen Reactions  . Morphine And Related Anaphylaxis    Swelling of the tongue and face    Medications: I have reviewed the patient's current medications.  Results for orders placed or performed during the hospital encounter of 09/29/17 (from the past 48 hour(s))  Ethanol     Status: None   Collection Time: 09/29/17 10:53 AM  Result Value Ref Range   Alcohol, Ethyl (B) <10 <10 mg/dL    Comment: (NOTE) Lowest detectable limit for serum alcohol is 10 mg/dL. For medical purposes only. Performed at [facility], [place] [address] [place], [place] [address]   Protime-INR     Status: None   Collection Time: 09/29/17 10:53 AM  Result Value Ref Range   Prothrombin Time 12.8 11.4 - 15.2 seconds   INR 0.97     Comment: Performed at [facility] [address] [place], [place] [address]  APTT     Status: None   Collection Time: 09/29/17 10:53 AM  Result Value Ref Range   aPTT 27 24 - 36 seconds    Comment: Performed at [facility] [place],  [place] [address]  CBC     Status: Abnormal   Collection Time: 09/29/17 10:53 AM  Result Value Ref Range   WBC 12.6 (H) 4.0 - 10.5 K/uL   RBC 4.38 4.22 - 5.81 MIL/uL   Hemoglobin 14.3 13.0 - 17.0 g/dL   HCT 41.5 39.0 - 52.0 %   MCV 94.7 78.0 - 100.0 fL   MCH 32.6 26.0 - 34.0 pg   MCHC 34.5 30.0 - 36.0 g/dL   RDW 13.2 11.5 - 15.5 %   Platelets 290 150 - 400 K/uL    Comment: Performed at [facility], [place] [address], [place], [place] [address]  Differential     Status: Abnormal   Collection Time: 09/29/17 10:53 AM  Result Value Ref Range    Neutrophils Relative % 56 %   Neutro Abs 7.0 1.7 - 7.7 K/uL   Lymphocytes Relative 35 %   Lymphs Abs 4.3 (H) 0.7 - 4.0 K/uL   Monocytes Relative 8 %   Monocytes Absolute 1.0 0.1 - 1.0 K/uL   Eosinophils Relative 1 %   Eosinophils Absolute 0.1 0.0 - 0.7 K/uL   Basophils Relative 1 %   Basophils Absolute 0.1 0.0 - 0.1 K/uL   Immature Granulocytes 1 %   Abs Immature Granulocytes 0.1 0.0 - 0.1 K/uL    Comment: Performed at [facility] [address], [place], [place] [address]  Comprehensive metabolic panel     Status: Abnormal   Collection Time: 09/29/17 10:53 AM  Result Value Ref Range   Sodium 138 135 - 145 mmol/L   Potassium 4.0 3.5 - 5.1 mmol/L   Chloride 101 101 - 111 mmol/L   CO2 24 22 - 32 mmol/L   Glucose, Bld 259 (H) 65 - 99 mg/dL   BUN 21 (H) 6 - 20 mg/dL   Creatinine, Ser 1.06 0.61 - 1.24 mg/dL   Calcium 9.4 8.9 - 10.3 mg/dL   Total Protein 7.8 6.5 - 8.1 g/dL   Albumin 4.0 3.5 - 5.0 g/dL   AST 17 15 - 41 U/L   ALT 17 17 - 63 U/L   Alkaline Phosphatase 62 38 - 126 U/L   Total Bilirubin 1.0 0.3 - 1.2 mg/dL   GFR calc non Af Amer >60 >60 mL/min   GFR calc Af Amer >60 >60 mL/min    Comment: (NOTE) The eGFR has been calculated using the CKD EPI equation. This calculation has not been validated in all clinical situations. eGFR's persistently <60 mL/min signify possible Chronic Kidney Disease.    Anion gap 13 5 - 15    Comment: Performed at [facility] [address], [place], [place] [address]  I-stat troponin, ED     Status: None   Collection Time: 09/29/17 10:54 AM  Result Value Ref Range   Troponin i, poc 0.01 0.00 - 0.08 ng/mL   Comment 3            Comment: Due to the release kinetics of cTnI, a negative result within the first hours of the onset of symptoms does not rule out myocardial infarction with certainty. If myocardial infarction is still suspected, repeat the test at appropriate intervals.   I-Stat CG4 Lactic Acid, ED     Status:  Abnormal   Collection Time: 09/29/17 10:56 AM  Result Value Ref Range   Lactic Acid, Venous 2.35 (HH) 0.5 - 1.9 mmol/L   Comment NOTIFIED PHYSICIAN   MRSA PCR Screening  Status: None   Collection Time: 09/29/17 12:33 PM  Result Value Ref Range   MRSA by PCR NEGATIVE NEGATIVE    Comment:        The GeneXpert MRSA Assay (FDA approved for NASAL specimens only), is one component of a comprehensive MRSA colonization surveillance program. It is not intended to diagnose MRSA infection nor to guide or monitor treatment for MRSA infections. Performed at [facility], [place] [address] [place], [place] [address]   Hemoglobin A1c     Status: Abnormal   Collection Time: 09/29/17  2:03 PM  Result Value Ref Range   Hgb A1c MFr Bld 8.7 (H) 4.8 - 5.6 %    Comment: (NOTE) Pre diabetes:          5.7%-6.4% Diabetes:              >6.4% Glycemic control for   <7.0% adults with diabetes    Mean Plasma Glucose 202.99 mg/dL    Comment: Performed at [facility], [place] [address] [place], [place] [address]  Triglycerides     Status: Abnormal   Collection Time: 09/29/17  2:03 PM  Result Value Ref Range   Triglycerides 240 (H) <150 mg/dL    Comment: Performed at [facility], [place] [address] [place], [place] [address]  Glucose, capillary     Status: Abnormal   Collection Time: 09/29/17  3:25 PM  Result Value Ref Range   Glucose-Capillary 305 (H) 65 - 99 mg/dL  I-STAT 3, arterial blood gas (G3+)     Status: Abnormal   Collection Time: 09/29/17  3:42 PM  Result Value Ref Range   pH, Arterial 7.353 7.350 - 7.450   pCO2 arterial 49.3 (H) 32.0 - 48.0 mmHg   pO2, Arterial 493.0 (H) 83.0 - 108.0 mmHg   Bicarbonate 27.4 20.0 - 28.0 mmol/L   TCO2 29 22 - 32 mmol/L   O2 Saturation 100.0 %   Acid-Base Excess 1.0 0.0 - 2.0 mmol/L   Patient temperature 98.6 F    Collection site RADIAL, ALLEN'S TEST ACCEPTABLE    Sample type ARTERIAL   Glucose, capillary     Status: Abnormal    Collection Time: 09/29/17  8:22 PM  Result Value Ref Range   Glucose-Capillary 222 (H) 65 - 99 mg/dL    Ct Angio Head W Or Wo Contrast  Result Date: 09/29/2017 CLINICAL DATA:  Acute left basal ganglia hemorrhage with extension into the left lateral ventricle. EXAM: CT ANGIOGRAPHY HEAD TECHNIQUE: Multidetector CT imaging of the head was performed using the standard protocol during bolus administration of intravenous contrast. Multiplanar CT image reconstructions and MIPs were obtained to evaluate the vascular anatomy. CONTRAST:  55m ISOVUE-300 IOPAMIDOL (ISOVUE-300) INJECTION 61% COMPARISON:  CT head without contrast from the same day. FINDINGS: CTA HEAD Anterior circulation: Dense atherosclerotic calcifications are present within the cavernous internal carotid arteries bilaterally. There is tortuosity of the internal carotid arteries below the skull base. The distal cervical ICA lumen is narrowed on the left compared to the right. Atherosclerotic calcifications narrow the right cavernous ICA lumen to less than 1 mm. The lumen of the left internal carotid artery is slightly larger than on the right. The ICA terminus is within normal limits bilaterally. The A1 and M1 segments are normal. The anterior communicating artery is patent. MCA bifurcations are intact. Diffuse small vessel irregularity is present in the ACA and MCA branch vessels without a significant proximal stenosis or occlusion. There are no  aneurysms. No vascular malformation is present. Posterior circulation: The left vertebral artery is slightly dominant to the right. PICA origins are visualized and normal. Basilar artery is normal. Both posterior cerebral arteries originate from the basilar tip. There is some attenuation of distal PCA branch vessels without a significant proximal stenosis or occlusion. There is no aneurysm. Venous sinuses: Dural sinuses are patent. Straight sinus deep cerebral veins are intact. Anatomic variants: None  IMPRESSION: 1. No acute or focal vascular lesion to explain the intracranial hemorrhage. 2. Atherosclerotic changes in the cavernous internal carotid arteries bilaterally with moderate stenoses relative to the more distal ICA termini, right greater than left. 3. Asymmetric narrowing of the distal left ICA lumen below the skull base suggesting a significant proximal stenosis. 4. No significant change in the parenchymal hemorrhage or intraventricular component. Electronically Signed   By: San Morelle M.D.   On: 09/29/2017 12:23   Dg Abd 1 View  Result Date: 09/29/2017 CLINICAL DATA:  Orogastric tube placement EXAM: ABDOMEN - 1 VIEW COMPARISON:  None. FINDINGS: Orogastric tube tip and side port are in the stomach. There is no appreciable bowel dilatation or air-fluid level to suggest bowel obstruction. No free air appreciable. IMPRESSION: Orogastric tube tip and side port in stomach. Visualized bowel appears unremarkable. No obstruction or free air demonstrated. Electronically Signed   By: Lowella Grip III M.D.   On: 09/29/2017 14:36   Ct Head Wo Contrast  Result Date: 09/29/2017 CLINICAL DATA:  72 y/o  M; follow-up intracranial hemorrhage. EXAM: CT HEAD WITHOUT CONTRAST TECHNIQUE: Contiguous axial images were obtained from the base of the skull through the vertex without intravenous contrast. COMPARISON:  09/29/2017 CT head. FINDINGS: Brain: Increase size of hematoma within the left basal ganglia measuring 4.6 x 4.6 x 2.6 cm (volume = 29 cm^3)(AP x ML x CC series 5, image 37 and series 6, image 40.) stable intraventricular hemorrhage within the left lateral ventricle with some redistribution to the occipital horn of the right lateral ventricle. Stable 4 mm left-to-right midline shift. Stable ventricle size. No new acute intracranial hemorrhage, stroke, or focal mass effect identified. Vascular: No hyperdense vessel or unexpected calcification. Skull: Normal. Negative for fracture or focal lesion.  Sinuses/Orbits: No acute finding. Other: None. IMPRESSION: 1. Increase size of hematoma within the left basal ganglia. Stable intraventricular hemorrhage with minimal redistribution to the occipital horn of right lateral ventricle. 2. Mild increase in local mass effect. Stable 4 mm left-to-right midline shift. These results will be called to the ordering clinician or representative by the Radiologist Assistant, and communication documented in the PACS or zVision Dashboard. Electronically Signed   By: Kristine Garbe M.D.   On: 09/29/2017 18:18   Dg Chest Port 1 View  Result Date: 09/29/2017 CLINICAL DATA:  Hypoxia EXAM: PORTABLE CHEST 1 VIEW COMPARISON:  Chest radiograph July 14, 2016 and chest CT November 24, 2016 FINDINGS: Endotracheal tube tip is 4.8 cm above the carina. Nasogastric tube tip and side port are below the diaphragm. Central catheter tip is in the superior vena cava. No pneumothorax. There is underlying parenchymal fibrotic type change. There is no edema or consolidation. The heart size is upper normal with pulmonary vascularity within normal limits. There is aortic atherosclerosis. No adenopathy. No evident bone lesions. IMPRESSION: Tube and catheter positions as described without evident pneumothorax. Underlying fibrotic change without edema or consolidation. There is aortic atherosclerosis. Stable cardiac silhouette. Aortic Atherosclerosis (ICD10-I70.0). Electronically Signed   By: Lowella Grip III M.D.   On:  09/29/2017 14:35   Ct Head Code Stroke Wo Contrast  Result Date: 09/29/2017 CLINICAL DATA:  Code stroke.  Right-sided facial droop. EXAM: CT HEAD WITHOUT CONTRAST TECHNIQUE: Contiguous axial images were obtained from the base of the skull through the vertex without intravenous contrast. COMPARISON:  None. FINDINGS: Brain: Acute left basal ganglia hemorrhage measures 2.5 x 4.2 x 2.0 cm. Estimated volume is 88 cm^3 interventricular hemorrhage scratched at the hemorrhage  extends into the left lateral ventricle. There is early dilation of the left lateral ventricle. Midline shift at the foramen of Monro is 4 mm. Hemorrhage extends into the foramen of Monro. There is no hemorrhage in the fourth ventricle or aqueduct. Periventricular white matter changes are present bilaterally. No other focal parenchymal infarct is present. Brainstem and cerebellum are normal. Vascular: Atherosclerotic calcifications are present within the cavernous internal carotid arteries bilaterally. There is no hyperdense vessel. Skull: Calvarium is intact. No focal lytic or blastic lesions are present. Sinuses/Orbits: The paranasal sinuses and the mastoid air cells are clear. Bilateral lens replacements are present. Globes and orbits are otherwise unremarkable. IMPRESSION: 1. 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 Hemorrhage measures 2.5 x 4.2 x 2.0 cm (estimated volume is 88 cm^3). 2. Intraventricular extension of hemorrhage into the left lateral ventricle to the foramen of Monro. There is early dilation of the left lateral ventricle concerning for developing hydrocephalus. 3. 4 mm left right midline shift. Critical Value/emergent results were called by telephone at the time of interpretation on 09/29/2017 at 11:13 am to Dr. Regenia Skeeter , who verbally acknowledged these results. Electronically Signed   By: San Morelle M.D.   On: 09/29/2017 11:15    Review of systems not obtained due to patient factors. Blood pressure (!) 110/49, pulse 81, temperature 97.6 F (36.4 C), resp. rate 20, height _0  (1.778 m), weight 124.5 kg (274 lb 7.6 oz), SpO2 100 %.  Patient sedated on propofol.  No eye-opening to noxious stimuli.  Pupils equal bilaterally and reactive.  Corneal reflexes present bilaterally.  Weak cough and gag.  Patient with no right upper lower extremity movement to noxious stimuli.  Patient semipurposeful on the left. 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  No indication for operative intervention.  No role in external ventricular drainage.  Cooper Render Mariyah Upshaw 09/29/2017, 8:38 PM

## 2017-09-29 NOTE — Procedures (Signed)
Central venous catheter placement  Indication: Potential need for hyper tonic agents in a patient with a significant intracranial hemorrhage.  Procedure: The indications and risks of the procedure were explained to family before proceeding.  A timeout was performed.  The area over the left subclavian was extensively prepped with chlorhexidine then broadly draped.  Sterile garb was donned and the subclavian vein easily cannulated.  A wire was gently passed and the tract dilated.  A 20 cm triple-lumen 7 French catheter was placed over the wire.  There was good flow from all ports.  There was some slight ooze from the insertion site and I note that the patient was on both aspirin and Plavix prior to presentation.  I took the liberty of administering 20 mcg of DDAVP in the setting of a intracranial hemorrhage.  There was no immediate complication chest x-ray for line placement is pending

## 2017-09-29 NOTE — H&P (Signed)
Admission H&P    Chief Complaint: Code Stroke HPI: Dwayne [name] is an 73 y.o. male who has an extensive medical history listed below and is on Plavix and ASA, but no anticoagulation. He was in his usual state of health (MRS 3 for using wlker), when family noted him to have sudden right side weakness. They called EMS and he was very hypertensive with SBP in 200's per PM report. He declined quickly and was not responding upon arrival to ER, GCS 9. Upon emergent bedside evaluation in CT he was noted to have a large left BG/thalamus ICH with IVH extension, volume ~30cc. ICH score 4. He will be admitted for further neurologic care.  LSN: 10am tPA Given: no; Hemorrhage  Past Medical History Past Medical History:  Diagnosis Date  . Anxiety   . Aortic atherosclerosis ([facility]) 08/14/2017  . Arthritis    "knees, elbows, hands" (02/12/2014)  . Compression fracture of L2 ([facility]) 03/08/2013  . COPD (chronic obstructive pulmonary disease) ([facility])   . Coronary artery disease    Dr. Delton See, [facility]- follows  . Depression   . Esophageal reflux   . Gallstones   . Hyperlipidemia   . Hypertension, benign    Benign  . 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 Myocardial infarction ([facility]) 1998 X 2   "probably a couple since 1998" (02/12/2014)  . Palpitations   . Pneumonia ~ 2004 X 1  . Swallowing difficulty    intermittent-occ"doesn't matter with solid or liquid"    Past Surgical History Past Surgical History:  Procedure Laterality Date  . CARDIAC CATHETERIZATION  ~ 2012  . CATARACT EXTRACTION W/ INTRAOCULAR LENS IMPLANT Right 10/2013  . CHOLECYSTECTOMY N/A 03/07/2016   Procedure: LAPAROSCOPIC CHOLECYSTECTOMY WITH INTRAOPERATIVE CHOLANGIOGRAM;  Surgeon: Glenna Fellows, MD;  Location: [facility];  Service: General;  Laterality: N/A;  . CORONARY ANGIOPLASTY  1998  . CORONARY ANGIOPLASTY WITH STENT PLACEMENT  1999; 2014; 02/12/2014   "1; 2; 1"  . LEFT HEART  CATHETERIZATION WITH CORONARY ANGIOGRAM N/A 02/12/2014   Procedure: LEFT HEART CATHETERIZATION WITH CORONARY ANGIOGRAM;  Surgeon: Lesleigh Noe, MD;  Location: [facility] CATH LAB;  Service: Cardiovascular;  Laterality: N/A;    Family History Family History  Problem Relation Age of Onset  . Heart attack Father   . Diabetes Father   . Stroke Neg Hx     Social History  reports that he has been smoking cigarettes.  He has a 25.00 pack-year smoking history. He has never used smokeless tobacco. He reports that he drinks alcohol. He reports that he does not use drugs.  Allergies Allergies  Allergen Reactions  . Morphine And Related Swelling    Swelling of the tongue and face    Home Medications Plavix, ASA, multiple BP meds and diabetes meds, [facility] Hospital Medications  ROS: Unable to give any ROS  Physical Examination: Vitals:   09/29/17 1000  Weight: 124.5 kg (274 lb 7.6 oz)    General - poor, unkept, obese, critically ill Heart - Regular rate and rhythm - no murmer Lungs - Clear to auscultation Abdomen - Soft - non tender Extremities - Distal pulses intact - no edema Skin - Warm and dry   Neurologic Examination:   Mental Status:  He will briefly alert to voice + tactile stimuli, doesn't attend or follow commands. There is right side neglect. No attempt to speak, moans only.  Cranial Nerves:  IRight facial droop, right  VF has decreased blink. PERRL, 3mm. Unable to assess rest at this time d/t lack of participation. Motor/Sensory: Right flaccid hemiparesis. Some spontaneous movement seen on left arm anti-gravity, lateral left leg movements seen only. Decreased to noxious stim on right, but withdraws/localizes on left.  Plantars: mute bilat Cerebellar: unable to participate Gait: not tested  LABORATORY STUDIES   Basic Metabolic Panel: No results for input(s): NA, K, CL, CO2, GLUCOSE, BUN, CREATININE, CALCIUM, MG, PHOS in the last 168 hours.  Liver Function Tests: No  results for input(s): AST, ALT, ALKPHOS, BILITOT, PROT, ALBUMIN in the last 168 hours. No results for input(s): LIPASE, AMYLASE in the last 168 hours. No results for input(s): AMMONIA in the last 168 hours.  CBC: No results for input(s): WBC, NEUTROABS, HGB, HCT, MCV, PLT in the last 168 hours.  Cardiac Enzymes: No results for input(s): CKTOTAL, CKMB, CKMBINDEX, TROPONINI in the last 168 hours.  BNP: Invalid input(s): POCBNP  CBG: No results for input(s): GLUCAP in the last 168 hours.  Microbiology:   Coagulation Studies: No results for input(s): LABPROT, INR in the last 72 hours.  Urinalysis: No results for input(s): COLORURINE, LABSPEC, PHURINE, GLUCOSEU, HGBUR, BILIRUBINUR, KETONESUR, PROTEINUR, UROBILINOGEN, NITRITE, LEUKOCYTESUR in the last 168 hours.  Invalid input(s): APPERANCEUR  Lipid Panel:  No results found for: CHOL, TRIG, HDL, CHOLHDL, VLDL, LDLCALC  HgbA1C:  Lab Results  Component Value Date   HGBA1C 7.9 (H) 03/04/2016   EEG: Pending  I have evaluated the CT emergently at CT bedside. There is a Left BG and thalamic ICH with IVH extension. Ventricles are already showing early signs of hydrocephalus.   Assessment: 72 y.o. male with poor baseline health presents with large ICH/IVH  # Left BG and thalamic ICH with IVH-likely HTN  Plan:  Admit to ICU for greater than 2 MN  CTA for underlying etiology  Repeat CT in 6h  SBP Goal <140  PT consult, OT consult, Speech consult  No Prophylactic therapy d/t bleed  Risk factor modification  Telemetry monitoring  Frequent neuro checks   HTN emergency  BP goal <140 SBP, [place] gtt initiated  # DM2 w/hyperglycemia-  tight glucose control, SSI. Hold metformin  # Encephalopathy-  d/t above. GCS 9, may need intubation to protect airway  # CAD Hold Plavix/ASA.    Attending Neurologist's Note to Follow  Patient presented with right-sided weakness the progressively got worse from the time EMS saw  the patient to arrival at [facility], ER.  Patient was aphasic, not following any commands and neglecting the right side with right plegia. Stat CT head showed a  Large thalamic hemorrhage with IVH and midline. CTA - negatove spot sign. BP immediately controlled. Intubated for airway protection.  Repeat CT at 6 hrs, slightly worse, NS consulted for EVD, Midline shift stable.       This patient is neurologically critically ill due to ICH with IVH and midline shift.  High mortality risk.  He is at risk for significant risk of neurological worsening from cerebral edema,  death from brain herniation, heart failureinfection, respiratory failure and seizure. This patient's care requires constant monitoring of vital signs, hemodynamics, respiratory and cardiac monitoring, review of multiple databases, neurological assessment, discussion with family, other specialists and medical decision making of high complexity.  I spent 100  minutes of neurocritical time in the care of this patient.

## 2017-09-30 ENCOUNTER — Inpatient Hospital Stay (HOSPITAL_COMMUNITY): Payer: Medicare Other

## 2017-09-30 ENCOUNTER — Encounter (HOSPITAL_COMMUNITY): Payer: Self-pay | Admitting: Radiology

## 2017-09-30 DIAGNOSIS — I611 Nontraumatic intracerebral hemorrhage in hemisphere, cortical: Secondary | ICD-10-CM

## 2017-09-30 LAB — GLUCOSE, CAPILLARY
GLUCOSE-CAPILLARY: 225 mg/dL — AB (ref 65–99)
GLUCOSE-CAPILLARY: 201 mg/dL — AB (ref 65–99)
GLUCOSE-CAPILLARY: 194 mg/dL — AB (ref 65–99)
Glucose-Capillary: 186 mg/dL — ABNORMAL HIGH (ref 65–99)
Glucose-Capillary: 192 mg/dL — ABNORMAL HIGH (ref 65–99)
Glucose-Capillary: 235 mg/dL — ABNORMAL HIGH (ref 65–99)

## 2017-09-30 LAB — MAGNESIUM: Magnesium: 1.5 mg/dL — ABNORMAL LOW (ref 1.7–2.4)

## 2017-09-30 LAB — PHOSPHORUS: Phosphorus: 2.2 mg/dL — ABNORMAL LOW (ref 2.5–4.6)

## 2017-09-30 MED ORDER — VITAL HIGH PROTEIN PO LIQD
1000.0000 mL | ORAL | Status: DC
  Administered 2017-09-30 – 2017-10-01 (×3): 1000 mL

## 2017-09-30 MED ORDER — CARVEDILOL 12.5 MG PO TABS
12.5000 mg | ORAL_TABLET | Freq: Two times a day (BID) | ORAL | Status: DC
  Administered 2017-09-30 – 2017-10-01 (×2): 12.5 mg via ORAL
  Filled 2017-09-30 (×2): qty 1

## 2017-09-30 MED ORDER — PRO-STAT SUGAR FREE PO LIQD
30.0000 mL | Freq: Two times a day (BID) | ORAL | Status: DC
  Administered 2017-09-30 – 2017-10-03 (×6): 30 mL
  Filled 2017-09-30 (×6): qty 30

## 2017-09-30 NOTE — Progress Notes (Signed)
PULMONARY / CRITICAL CARE MEDICINE   Name: Dwayne [name] MRN: [id number] DOB: Feb 13, 1946    ADMISSION DATE:  09/29/2017 CONSULTATION DATE:  09/29/17  REFERRING MD:  [facility].  CHIEF COMPLAINT:  Obtundation.   HISTORY OF PRESENT ILLNESS:   72 year old man with a history of COPD, coronary artery disease (on aspirin and Plavix), diabetes, hypertension, hyperlipidemia.  He developed acute inability to stand, right-sided weakness on 5/24, was found to be severely hypertensive to the 200s with a suppressed sensorium.  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  He was started on nicardipine for blood pressure management and admitted for further care.  PCCM consulted regarding his lethargy, altered mental status and likely impending respiratory failure.  PAST MEDICAL HISTORY :  He  has a past medical history of Anxiety, Aortic atherosclerosis (HCC) (08/14/2017), Arthritis, Compression fracture of L2 (HCC) (03/08/2013), COPD (chronic obstructive pulmonary disease) (HCC), Coronary artery disease, Depression, Esophageal reflux, Gallstones, Hyperlipidemia, Hypertension, benign, IDDM (insulin dependent diabetes mellitus) (HCC), Myocardial infarction (HCC) (1998 X 2), Palpitations, Pneumonia (~ 2004 X 1), and Swallowing difficulty.  PAST SURGICAL HISTORY: He  has a past surgical history that includes Coronary angioplasty with stent (1999; 2014; 02/12/2014); Coronary angioplasty (1998); Cardiac catheterization (~ 2012); Cataract extraction w/ intraocular lens implant (Right, 10/2013); left heart catheterization with coronary angiogram (N/A, 02/12/2014); and Cholecystectomy (N/A, 03/07/2016).  Allergies  Allergen Reactions  . Morphine And Related Anaphylaxis    Swelling of the tongue and face    No current facility-administered medications on file prior to encounter.    Current  Outpatient Medications on File Prior to Encounter  Medication Sig  . acetaminophen (TYLENOL) 500 MG tablet Take 500 mg by mouth every 6 (six) hours as needed for mild pain.  Marland Kitchen albuterol (PROVENTIL HFA;VENTOLIN HFA) 108 (90 BASE) MCG/ACT inhaler Inhale 2 puffs into the lungs every 6 (six) hours as needed for wheezing.  Marland Kitchen aspirin 81 MG chewable tablet Chew 1 tablet (81 mg total) by mouth daily.  Marland Kitchen atorvastatin (LIPITOR) 80 MG tablet Take 80 mg by mouth daily at 6 PM.   . clonazePAM (KLONOPIN) 0.5 MG tablet Take 0.5 mg by mouth daily.   . clopidogrel (PLAVIX) 75 MG tablet Take 1 tablet (75 mg total) by mouth daily.  . furosemide (LASIX) 20 MG tablet Take 1 tablet (20 mg total) by mouth every other day.  Marland Kitchen GLIPIZIDE XL 10 MG 24 hr tablet Take 10 mg by mouth daily.   . hydrochlorothiazide (HYDRODIURIL) 25 MG tablet Take 25 mg by mouth every other day.   . isosorbide mononitrate (IMDUR) 30 MG 24 hr tablet Take 1 tablet (30 mg total) by mouth daily.  Marland Kitchen lisinopril (PRINIVIL,ZESTRIL) 40 MG tablet Take 40 mg by mouth daily.  . metFORMIN (GLUCOPHAGE) 1000 MG tablet Take 1 tablet (1,000 mg total) by mouth 2 (two) times daily with a meal.  . nitroGLYCERIN (NITROSTAT) 0.4 MG SL tablet Place 0.4 mg under the tongue every 5 (five) minutes as needed for chest pain (x 3 doses).  . pantoprazole (PROTONIX) 40 MG tablet Take 1 tablet (40 mg total) by mouth daily.  . Probiotic Product (PROBIOTIC PO) Take 1 tablet by mouth daily.  . sertraline (ZOLOFT) 100 MG tablet Take 200 mg by mouth at bedtime.     FAMILY HISTORY:  His indicated that his mother is deceased. He indicated that his father is deceased.  He indicated that the status of his neg hx is unknown.   SOCIAL HISTORY: He  reports that he has been smoking cigarettes.  He has a 25.00 pack-year smoking history. He has never used smokeless tobacco. He reports that he drinks alcohol. He reports that he does not use drugs.  REVIEW OF SYSTEMS:   Unable to  obtain.  SUBJECTIVE:  Purposeful when off sedation.  VITAL SIGNS: BP (!) 144/59   Pulse 98   Temp (!) 97.5 F (36.4 C) (Axillary)   Resp 20   Ht  (1.778 m)   Wt 274 lb 7.6 oz (124.5 kg)   SpO2 100%   BMI 39.38 kg/m   HEMODYNAMICS:    VENTILATOR SETTINGS: Vent Mode: PSV;CPAP FiO2 (%):  [40 %] 40 % Set Rate:  [20 bmp] 20 bmp Vt Set:  [580 mL] 580 mL PEEP:  [5 cmH20] 5 cmH20 Pressure Support:  [5 cmH20] 5 cmH20 Plateau Pressure:  [11 cmH20-14 cmH20] 14 cmH20  INTAKE / OUTPUT: I/O last 3 completed shifts: In: 535.2 [I.V.:435.2; IV Piggyback:100] Out: 425 [Urine:100; Emesis/NG output:325]  PHYSICAL EXAMINATION: General:  Appears older than stated age. Neuro:  No spontaneous eye opening. L gaze preference. Pupil symmetric. Extensor response RUE. Flexor elsewhere. Moves L spontaneously. Plantar upgoing. HEENT:  ETT ang OGT in place. Cardiovascular:  Hypertensive. Extremities warm. No JVD. S1S2 normal with no murmurs. Lungs:  Synchronous with ventilator. Chest clear. Abdomen:  Soft non-tender. Musculoskeletal:  No active joints  Skin:  No abrasions.  LABS:  BMET Recent Labs  Lab 09/29/17 1053  NA 138  K 4.0  CL 101  CO2 24  BUN 21*  CREATININE 1.06  GLUCOSE 259*    Electrolytes Recent Labs  Lab 09/29/17 1053  CALCIUM 9.4    CBC Recent Labs  Lab 09/29/17 1053  WBC 12.6*  HGB 14.3  HCT 41.5  PLT 290    Coag's Recent Labs  Lab 09/29/17 1053  APTT 27  INR 0.97    Sepsis Markers Recent Labs  Lab 09/29/17 1056  LATICACIDVEN 2.35*    ABG Recent Labs  Lab 09/29/17 1542  PHART 7.353  PCO2ART 49.3*  PO2ART 493.0*    Liver Enzymes Recent Labs  Lab 09/29/17 1053  AST 17  ALT 17  ALKPHOS 62  BILITOT 1.0  ALBUMIN 4.0    Cardiac Enzymes No results for input(s): TROPONINI, PROBNP in the last 168 hours.  Glucose Recent Labs  Lab 09/29/17 2022 09/29/17 2348 09/30/17 0406 09/30/17 0833 09/30/17 1156 09/30/17 1603   GLUCAP 222* 208* 192* 186* 225* 201*    Imaging Ct Head Wo Contrast  Result Date: 09/30/2017 CLINICAL DATA:  Follow-up intracranial hemorrhage. RIGHT-sided weakness. History of hypertension. EXAM: CT HEAD WITHOUT CONTRAST TECHNIQUE: Contiguous axial images were obtained from the base of the skull through the vertex without intravenous contrast. COMPARISON:  09/29/2017. FINDINGS: Brain: LEFT thalamic/basal ganglia hemorrhage stable to improved. Mild surrounding edema may be minimally increased but there is no worsening of LEFT-to-RIGHT shift. Intraventricular hemorrhage predominantly LEFT-sided, with some redistribution to the RIGHT lateral ventricle. No hydrocephalus. Vascular: Calcification of the cavernous internal carotid arteries consistent with cerebrovascular atherosclerotic disease. No signs of intracranial large vessel occlusion. Skull: Calvarium intact. Sinuses/Orbits: No layering sinus fluid. Other: Mastoids unremarkable. IMPRESSION: Essentially stable LEFT thalamic/basal ganglia hemorrhage with intraventricular extension. No increase in size, worsening shift, or hydrocephalus. Electronically Signed   By: Elsie Stain M.D.   On: 09/30/2017 08:24   Ct Head Wo Contrast  Result Date: 09/29/2017 CLINICAL DATA:  72 y/o  M; follow-up intracranial hemorrhage. EXAM: CT HEAD WITHOUT CONTRAST TECHNIQUE: Contiguous axial images were obtained from the base of the skull through the vertex without intravenous contrast. COMPARISON:  09/29/2017 CT head. FINDINGS: Brain: Increase size of hematoma within the left basal ganglia measuring 4.6 x 4.6 x 2.6 cm (volume = 29 cm^3)(AP x ML x CC series 5, image 37 and series 6, image 40.) stable intraventricular hemorrhage within the left lateral ventricle with some redistribution to the occipital horn of the right lateral ventricle. Stable 4 mm left-to-right midline shift. Stable ventricle size. No new acute intracranial hemorrhage, stroke, or focal mass effect  identified. Vascular: No hyperdense vessel or unexpected calcification. Skull: Normal. Negative for fracture or focal lesion. Sinuses/Orbits: No acute finding. Other: None. IMPRESSION: 1. Increase size of hematoma within the left basal ganglia. Stable intraventricular hemorrhage with minimal redistribution to the occipital horn of right lateral ventricle. 2. Mild increase in local mass effect. Stable 4 mm left-to-right midline shift. These results will be called to the ordering clinician or representative by the Radiologist Assistant, and communication documented in the PACS or zVision Dashboard. Electronically Signed   By: Mitzi Hansen M.D.   On: 09/29/2017 18:18     STUDIES:  N/A  CULTURES: Results for orders placed or performed during the hospital encounter of 09/29/17  MRSA PCR Screening     Status: None   Collection Time: 09/29/17 12:33 PM  Result Value Ref Range Status   MRSA by PCR NEGATIVE NEGATIVE Final    Comment:        The GeneXpert MRSA Assay (FDA approved for NASAL specimens only), is one component of a comprehensive MRSA colonization surveillance program. It is not intended to diagnose MRSA infection nor to guide or monitor treatment for MRSA infections. Performed at [facility], [address] [place], [place] [address]      ANTIBIOTICS: Anti-infectives (From admission, onward)   None      SIGNIFICANT EVENTS: Intubated 5/24 for airway protection.  LINES/TUBES:  Drain   External Urinary Catheter 443 days  NG/OG Tube Orogastric Center mouth Xray 1 day  External Urinary Catheter less than 1 day     Airway   Airway 7.5 mm 1 day     Wound   Incision (Closed) 03/07/16 Abdomen Other (Comment) 572 days  Incision - 4 Ports Abdomen 1: Umbilicus 2: Superior;Medial 3: Right;Mid;Lateral 4: Right;Mid;Upper 572 days     CVC Line   CVC Triple Lumen 09/29/17 Left Subclavian 1 day     PIV Line   Peripheral IV 07/14/16 Right Forearm 443  days  Peripheral IV 09/29/17 Left Antecubital 1 day  Peripheral IV 09/29/17 Right Hand 1 day     DISCUSSION: Acute deep white matter hemorrhage consistent with a hypertensive bleed.  ICH score: 3 . Currently stable CT appearance with minimal associated edema. Longer term prognosis is guarded.   ASSESSMENT / PLAN:  PULMONARY A: Acute hypoxic respiratory failure due to inability to protect airway.  Tolerating mechanical ventilation. P:   Wean to pressure support ventilation.  Will likely require tracheostomy if survives the acute period. VAP bundle   CARDIOVASCULAR A:  Hypertensive on Cardene infusion. P:  Initiate enteral antihypertensive medication. Secondary Prevention: n/a  RENAL A:   Volume Status: Clinically euvolemic P:   Continue current fluid strategy. Foley: Urinary catheter required to precisely measure urine output.   GASTROINTESTINAL A:   Nutritional Status: At moderate nutritional  risk P:   Initiate enteral feeding.  Will likely need PEG tube.  HEMATOLOGIC A:   At high DVT risk DVT prophylaxis: Mechanical prophylaxis for now.  Initiate chemical prophylaxis a day 7 post bleed.; Transfusion requirements: No P:  As above  INFECTIOUS A:   Mild reactive leukocytosis no signs of infection P:   No need for antibiotics Line Removal: All lines required  ENDOCRINE A:   Type 2 diabetes mellitus on oral hypoglycemic agents at home Glycemic control: Marginal glycemic control; Stress steroids: No stress steroids P:   Add basal insulin to current sliding scale coverage  NEUROLOGIC A:   High-grade intracranial hemorrhage.  High risk of long-term disability.  At risk for worsening cerebral edema. P:   RASS goal: Nightly hour neurochecks with sedation interruption for each check Minimize sedation. Consider hypertonic saline if show signs of increasing cerebral edema.   FAMILY  - Updates: Wife was updated by nurse earlier on today  - Inter-disciplinary  family meet or Palliative Care meeting due by:  day 7  CRITICAL CARE Performed by: Lynnell Catalan  Total critical care time: 40 minutes  Critical care time was exclusive of separately billable procedures and treating other patients.  Critical care was necessary to treat or prevent imminent or life-threatening deterioration.  Critical care was time spent personally by me on the following activities: development of treatment plan with patient and/or surrogate as well as nursing, discussions with consultants, evaluation of patient's response to treatment, examination of patient, obtaining history from patient or surrogate, ordering and performing treatments and interventions, ordering and review of laboratory studies, ordering and review of radiographic studies, pulse oximetry and re-evaluation of patient's condition.  Lynnell Catalan, MD [facility] Pulmonary and Critical Care Medicine [facility] Pager: [phone] After hours [phone]  09/30/2017, 5:46 PM

## 2017-09-30 NOTE — Progress Notes (Signed)
OT Cancellation Note  Patient Details Name: Dwayne [name] MRN: [id number] DOB: May 15, 1945   Cancelled Treatment:    Reason Eval/Treat Not Completed: Medical issues which prohibited therapy;Active bedrest order.Pt is on bed rest and intubated.    Evette Georges [phone] 09/30/2017, 1:59 PM

## 2017-09-30 NOTE — Progress Notes (Signed)
PT Cancellation Note  Patient Details Name: Dwayne [name] MRN: [id number] DOB: 03-14-1946   Cancelled Treatment:    Reason Eval/Treat Not Completed: Active bedrest order;Medical issues which prohibited therapy.  Pt is on bed rest and intubated.    Thanks,    Rollene Rotunda. Adetokunbo Mccadden, PT, DPT [phone]   09/30/2017, 1:08 PM

## 2017-09-30 NOTE — Progress Notes (Signed)
Initial Nutrition Assessment  DOCUMENTATION CODES:   Morbid obesity  INTERVENTION:  - When TF initiation feasible, recommend: Vital High Protein @ 30 mL/hr with 60 mL Prostat TID. This regimen + kcal from current Propofol rate will provide 1811 kcal (104% estimated kcal need), 153 grams of protein, and 602 mL free water.  - Will continue to monitor Propofol rate and changes.   NUTRITION DIAGNOSIS:   Inadequate oral intake related to inability to eat as evidenced by NPO status.  GOAL:   Provide needs based on ASPEN/SCCM guidelines  MONITOR:   Vent status, Weight trends, Labs  REASON FOR ASSESSMENT:   Ventilator  ASSESSMENT:   72 year old male admitted with hypertensive dominant hemisphere basal ganglia hemorrhage extending into his thalamus with some extension into his left lateral ventricle.  Serial CT scans demonstrate some increased volume of hemorrhage into his parasylvian region on the left.  No evidence of significant increase in intraventricular blood.  No evidence of obstructive hydrocephalus.  Pt is intubated, sedated, and OGT in place. Per abdominal x-ray results from yesterday, OGT tip and side port is in the stomach. Pt's wife was at bedside and reported that she had been informed that plan is to start TF tomorrow. Unable to obtain any PTA information from pt's wife as her friend entered the room and they were discussing pt's progress and other items; will attempt to obtain information at follow-up to be mindful of pt's wife's desire to spend time with her friend.   Per PCCM NP note yesterday afternoon: pt with acute large L thalamic and ICH, hx of COPD and acute respiratory failure d/t inability to protect airway.  Patient is currently intubated on ventilator support MV: 10.2 L/min Temp (24hrs), Avg:98.7 F (37.1 C), Min:97.6 F (36.4 C), Max:99.5 F (37.5 C) Propofol: 18.6 ml/hr (491 kcal) BP: 124/58 and MAP: 77  Medications reviewed; 20 mg Pepcid BID, sliding  scale Novolog, 1 tablet Senokot per OGT BID.  Labs reviewed; CBGs: 192 and 186 mg/dL today.  Drips: Propofol @ 25 mcg/kg/min, Cardene @ 10 mg/hr.      NUTRITION - FOCUSED PHYSICAL EXAM:  Completed with no muscle and no fat wasting present and no edema noted at this time.   Diet Order:   Diet Order           Diet NPO time specified  Diet effective now          EDUCATION NEEDS:   No education needs have been identified at this time  Skin:  Skin Assessment: Reviewed RN Assessment  Last BM:  PTA/unknown  Height:   Ht Readings from Last 1 Encounters:  09/29/17  (1.778 m)    Weight:   Wt Readings from Last 1 Encounters:  09/29/17 274 lb 7.6 oz (124.5 kg)    Ideal Body Weight:  75.45 kg  BMI:  Body mass index is 39.38 kg/m.  Estimated Nutritional Needs:   Kcal:  8295-6213 (11-14 kcal/kg)  Protein:  >/= 151 grams (2 grams/kg IBW)  Fluid:  >/= 1.7 L/day      Trenton Gammon, MS, RD, LDN, [facility] Inpatient Clinical Dietitian Pager # [phone] After hours/weekend pager # [phone]

## 2017-09-30 NOTE — Progress Notes (Signed)
Pt transported to CT via vent. No complications noted. Becky Flowers RRT notified that the patient is back in his room.

## 2017-09-30 NOTE — Progress Notes (Signed)
SLP Cancellation Note  Patient Details Name: Dwayne [name] MRN: [id number] DOB: [birth year]   Cancelled treatment:       Reason Eval/Treat Not Completed: Patient not medically ready. Pt intubated. Will follow up when medically appropriate.  Rondel Baton, [place], CCC-SLP Speech-Language Pathologist [phone]   Arlana Lindau 09/30/2017, 9:13 AM

## 2017-09-30 NOTE — Progress Notes (Signed)
STROKE TEAM PROGRESS NOTE   HISTORY OF PRESENT ILLNESS (per record) Dwayne [name] is an 72 y.o. male who has an extensive medical history listed below and is on Plavix and ASA, but no anticoagulation. He was in his usual state of health (MRS 3 for using wlker), when family noted him to have sudden right side weakness. They called EMS and he was very hypertensive with SBP in 200's per PM report. He declined quickly and was not responding upon arrival to ER, GCS 9. Upon emergent bedside evaluation in CT he was noted to have a large left BG/thalamus ICH with IVH extension, volume ~30cc. ICH score 4. He will be admitted for further neurologic care.  LSN: 10am tPA Given: no; Hemorrhage    SUBJECTIVE (INTERVAL HISTORY) His family is not at bedside, informed daughter CT was stable   OBJECTIVE Temp:  [97.6 F (36.4 C)-99.5 F (37.5 C)] 98.7 F (37.1 C) (05/25 0829) Pulse Rate:  [59-98] 98 (05/25 0900) Cardiac Rhythm: Heart block (05/25 0000) Resp:  [6-21] 18 (05/25 0900) BP: (62-196)/(45-154) 146/62 (05/25 0900) SpO2:  [78 %-100 %] 100 % (05/25 0900) FiO2 (%):  [40 %-100 %] 40 % (05/25 0844) Weight:  [274 lb 7.6 oz (124.5 kg)] 274 lb 7.6 oz (124.5 kg) (05/24 1104)  CBC:  Recent Labs  Lab 09/29/17 1053  WBC 12.6*  NEUTROABS 7.0  HGB 14.3  HCT 41.5  MCV 94.7  PLT 290    Basic Metabolic Panel:  Recent Labs  Lab 09/29/17 1053  NA 138  K 4.0  CL 101  CO2 24  GLUCOSE 259*  BUN 21*  CREATININE 1.06  CALCIUM 9.4    Lipid Panel:     Component Value Date/Time   TRIG 240 (H) 09/29/2017 1403   HgbA1c:  Lab Results  Component Value Date   HGBA1C 8.7 (H) 09/29/2017   Urine Drug Screen: No results found for: LABOPIA, COCAINSCRNUR, LABBENZ, AMPHETMU, THCU, LABBARB  Alcohol Level     Component Value Date/Time   ETH <10 09/29/2017 1053    IMAGING  Ct Angio Head W Or Wo Contrast 09/29/2017 IMPRESSION:  1. No acute or focal vascular lesion to explain the intracranial  hemorrhage.  2. Atherosclerotic changes in the cavernous internal carotid arteries bilaterally with moderate stenoses relative to the more distal ICA termini, right greater than left.  3. Asymmetric narrowing of the distal left ICA lumen below the skull base suggesting a significant proximal stenosis.  4. No significant change in the parenchymal hemorrhage or intraventricular component.   Ct Head Wo Contrast 09/30/2017 8:13 AM IMPRESSION:  Essentially stable LEFT thalamic/basal ganglia hemorrhage with intraventricular extension. No increase in size, worsening shift, or hydrocephalus.   Ct Head Wo Contrast 09/29/2017  IMPRESSION:  1. Increase size of hematoma within the left basal ganglia. Stable intraventricular hemorrhage with minimal redistribution to the occipital horn of right lateral ventricle.  2. Mild increase in local mass effect. Stable 4 mm left-to-right midline shift.   Ct Head Code Stroke Wo Contrast 09/29/2017 IMPRESSION:  1. 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 Hemorrhage measures 2.5 x 4.2 x 2.0 cm (estimated volume is 88 cm^3).  2. Intraventricular extension of hemorrhage into the left lateral ventricle to the foramen of Monro. There is early dilation of the left lateral ventricle concerning for developing hydrocephalus.  3. 4 mm left right midline shift.     Dg Chest Port 1 View 09/29/2017 IMPRESSION:  Tube and catheter positions as described without evident  pneumothorax. Underlying fibrotic change without edema or consolidation. There is aortic atherosclerosis. Stable cardiac silhouette. Aortic Atherosclerosis   Dg Chest Port 1 View 09/29/2017 IMPRESSION:  Tube and catheter positions as described without evident pneumothorax. Underlying fibrotic change without edema or consolidation. There is aortic atherosclerosis. Stable cardiac silhouette. Aortic Atherosclerosis   Dg Abd 1 View 09/29/2017 IMPRESSION:  Orogastric tube tip and side port in stomach.  Visualized bowel appears unremarkable. No obstruction or free air demonstrated.     PHYSICAL EXAM Vitals:   09/30/17 0800 09/30/17 0829 09/30/17 0844 09/30/17 0900  BP: (!) 150/77 (!) 157/78  (!) 146/62  Pulse:  67  98  Resp:  20  18  Temp:  98.7 F (37.1 C)    TempSrc:  Axillary    SpO2:  100% 100% 100%  Weight:      Height:        PHYSICAL EXAM  intubated . Afebrile. Head is nontraumatic. Neck is supple without bruit. Cardiac exam no murmur or gallop. Lungs are clear to auscultation. Distal pulses are well felt. Neurological Exam : Intubated. Does not open eyes to sternal rub. Globally aphasic. Left gaze deviation. Unable to look to the right past midline. Does not blink to threat.  Right lower facial weakness possible but difficult to assess due to ET tube. Does not follow any commands. Tongue midline. Cough and gag positive. Motor system exam reveals spontaneous left upper movement and left tremor.  Right upper extremity flicker  Withdraws bilateral lower extremity to pain but right  less than the left side. Right plantar upgoing left upgoing. Brisk reflexes.  Gait: Unable to test     Cortical Sensory Modalities:    left sensory neglect, right sensory neglect, left visual neglect, right visual neglect, double simultaneous extinction, agraphesthesia, and astereoagnosis     ASSESSMENT/PLAN [name] is a 72 y.o. male with history of coronary artery disease with previous MI, COPD with continued tobacco use, diabetes mellitus, hyperlipidemia, hypertension, depression, and anxiety presenting with onset of right-sided weakness followed by unresponsiveness.  He did not receive IV t-PA due to ICH.  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  CT Head F/U this AM - Essentially stable  MRI head - not  indicated  MRA head - not indicated  CTA Head - No acute or focal vascular lesion to explain the ICH. Bilateral carotid disease.  Carotid Doppler - consider ordering  2D Echo - not indicated  LDL - pending  HgbA1c - 8.7  VTE prophylaxis - SCDs  Diet Order           Diet NPO time specified  Diet effective now          aspirin 81 mg daily and clopidogrel 75 mg daily prior to admission, now on No antithrombotic  Ongoing aggressive stroke risk factor management  Therapy recommendations:  pending  Disposition:  Pending  Hypertension  Stable . Initial SBP goal < 140 mmHg - IV Cardene . Long-term BP goal normotensive  Hyperlipidemia  Lipid lowering medication PTA:  Lipitor 80 mg daily  LDL will order, goal < 70  Current lipid lowering medication: none - resume Lipitor at time of discharge  Diabetes  HgbA1c 8.7, goal < 7.0  Uncontrolled  Other Stroke Risk Factors  Advanced age  Cigarette smoker will be advised to  stop smoking  ETOH use, advised to drink no more than 1 alcoholic beverage per day.  Obesity, Body mass index is 39.38 kg/m., recommend weight loss, diet and exercise as appropriate  Coronary artery disease  Other Active Problems  Bilateral carotid artery disease - consider carotid Dopplers or CTA of neck.  Leukocytosis - 12.6 - afebrile  Morphine allergy  Anxiety and depression history - consider resuming Zoloft   Plan / Recommendations   Stroke workup: awaiting -   Therapy Follow Up: pending  Disposition: pending  Antiplatelet / Anticoagulation: pending  Statin: pending  MD Follow Up: [facility] in 6-8 weeks  Other: pending  Further risk factor modification per primary care MD: Follow Up 2 weeks   Hospital day # 1  Naomie Dean Stroke Neurology  To contact Stroke Continuity provider, please refer to [email or web address]. After hours, contact General Neurology

## 2017-10-01 LAB — RAPID URINE DRUG SCREEN, HOSP PERFORMED
Amphetamines: NOT DETECTED
BARBITURATES: NOT DETECTED
Benzodiazepines: NOT DETECTED
COCAINE: NOT DETECTED
Opiates: NOT DETECTED
Tetrahydrocannabinol: NOT DETECTED

## 2017-10-01 LAB — URINALYSIS, ROUTINE W REFLEX MICROSCOPIC
Bacteria, UA: NONE SEEN
Bilirubin Urine: NEGATIVE
GLUCOSE, UA: 50 mg/dL — AB
Ketones, ur: NEGATIVE mg/dL
LEUKOCYTES UA: NEGATIVE
NITRITE: NEGATIVE
PROTEIN: NEGATIVE mg/dL
SPECIFIC GRAVITY, URINE: 1.018 (ref 1.005–1.030)
pH: 5 (ref 5.0–8.0)

## 2017-10-01 LAB — BASIC METABOLIC PANEL
ANION GAP: 10 (ref 5–15)
Anion gap: 9 (ref 5–15)
BUN: 19 mg/dL (ref 6–20)
BUN: 20 mg/dL (ref 6–20)
CO2: 28 mmol/L (ref 22–32)
CO2: 26 mmol/L (ref 22–32)
CREATININE: 0.84 mg/dL (ref 0.61–1.24)
Calcium: 8.8 mg/dL — ABNORMAL LOW (ref 8.9–10.3)
Calcium: 8.9 mg/dL (ref 8.9–10.3)
Chloride: 101 mmol/L (ref 101–111)
Chloride: 100 mmol/L — ABNORMAL LOW (ref 101–111)
Creatinine, Ser: 0.81 mg/dL (ref 0.61–1.24)
Glucose, Bld: 184 mg/dL — ABNORMAL HIGH (ref 65–99)
Glucose, Bld: 212 mg/dL — ABNORMAL HIGH (ref 65–99)
POTASSIUM: 3.8 mmol/L (ref 3.5–5.1)
Potassium: 2.9 mmol/L — ABNORMAL LOW (ref 3.5–5.1)
SODIUM: 138 mmol/L (ref 135–145)
SODIUM: 136 mmol/L (ref 135–145)

## 2017-10-01 LAB — MAGNESIUM
Magnesium: 1.7 mg/dL (ref 1.7–2.4)
Magnesium: 1.8 mg/dL (ref 1.7–2.4)

## 2017-10-01 LAB — LIPID PANEL
Cholesterol: 118 mg/dL (ref 0–200)
HDL: 62 mg/dL (ref >40)
LDL CALC: 37 mg/dL (ref 0–99)
TRIGLYCERIDES: 97 mg/dL (ref <150)
Total CHOL/HDL Ratio: 1.9 RATIO
VLDL: 19 mg/dL (ref 0–40)

## 2017-10-01 LAB — CBC WITH DIFFERENTIAL/PLATELET
Abs Immature Granulocytes: 0.1 10*3/uL (ref 0.0–0.1)
BASOS ABS: 0 10*3/uL (ref 0.0–0.1)
Basophils Relative: 0 %
EOS ABS: 0 10*3/uL (ref 0.0–0.7)
Eosinophils Relative: 0 %
HCT: 37.3 % — ABNORMAL LOW (ref 39.0–52.0)
Hemoglobin: 12.6 g/dL — ABNORMAL LOW (ref 13.0–17.0)
Immature Granulocytes: 1 %
Lymphocytes Relative: 10 %
Lymphs Abs: 1.5 10*3/uL (ref 0.7–4.0)
MCH: 32.7 pg (ref 26.0–34.0)
MCHC: 33.8 g/dL (ref 30.0–36.0)
MCV: 96.9 fL (ref 78.0–100.0)
Monocytes Absolute: 1.4 10*3/uL — ABNORMAL HIGH (ref 0.1–1.0)
Monocytes Relative: 10 %
Neutro Abs: 11.6 10*3/uL — ABNORMAL HIGH (ref 1.7–7.7)
Neutrophils Relative %: 79 %
Platelets: 235 10*3/uL (ref 150–400)
RBC: 3.85 MIL/uL — AB (ref 4.22–5.81)
RDW: 13.7 % (ref 11.5–15.5)
WBC: 14.6 10*3/uL — AB (ref 4.0–10.5)

## 2017-10-01 LAB — GLUCOSE, CAPILLARY
GLUCOSE-CAPILLARY: 222 mg/dL — AB (ref 65–99)
GLUCOSE-CAPILLARY: 243 mg/dL — AB (ref 65–99)
GLUCOSE-CAPILLARY: 228 mg/dL — AB (ref 65–99)
Glucose-Capillary: 197 mg/dL — ABNORMAL HIGH (ref 65–99)
Glucose-Capillary: 270 mg/dL — ABNORMAL HIGH (ref 65–99)

## 2017-10-01 LAB — PHOSPHORUS
PHOSPHORUS: 2.6 mg/dL (ref 2.5–4.6)
Phosphorus: 2.2 mg/dL — ABNORMAL LOW (ref 2.5–4.6)

## 2017-10-01 MED ORDER — AMLODIPINE BESYLATE 5 MG PO TABS
5.0000 mg | ORAL_TABLET | Freq: Every day | ORAL | Status: DC
  Administered 2017-10-01 – 2017-10-03 (×3): 5 mg via ORAL
  Filled 2017-10-01 (×3): qty 1

## 2017-10-01 MED ORDER — POTASSIUM CHLORIDE 10 MEQ/50ML IV SOLN
10.0000 meq | INTRAVENOUS | Status: AC
  Administered 2017-10-01 (×8): 10 meq via INTRAVENOUS
  Filled 2017-10-01 (×8): qty 50

## 2017-10-01 MED ORDER — CHLORHEXIDINE GLUCONATE 0.12 % MT SOLN
OROMUCOSAL | Status: AC
  Administered 2017-10-01: 15 mL via OROMUCOSAL
  Filled 2017-10-01: qty 15

## 2017-10-01 MED ORDER — CARVEDILOL 12.5 MG PO TABS
25.0000 mg | ORAL_TABLET | Freq: Two times a day (BID) | ORAL | Status: DC
  Administered 2017-10-01 – 2017-10-08 (×12): 25 mg via ORAL
  Filled 2017-10-01 (×12): qty 2

## 2017-10-01 MED ORDER — AMLODIPINE 1 MG/ML ORAL SUSPENSION: 5.0000 mg | Freq: Every day | ORAL | Status: DC

## 2017-10-01 NOTE — Evaluation (Signed)
SLP Cancellation Note  Patient Details Name: Dwayne [name] MRN: [id number] DOB: [birth year]   Cancelled treatment:       Reason Eval/Treat Not Completed: Medical issues which prohibited therapy(pt remains on ventilator)   Chales Abrahams 10/01/2017, 7:10 AM  Donavan Burnet, MS [facility] SLP [phone]

## 2017-10-01 NOTE — Progress Notes (Signed)
Replaced tube holder.  Tube secured at 23cm at lips

## 2017-10-01 NOTE — Progress Notes (Signed)
Called [facility] and spoke to [employer] about patient's heart rate dipping into the the upper 40's. Patient has noted heart rhythm of Heart block which varies from type 2:1 and /or 2:2. Cardiology consult to be ordered. Advised to continue to monitor and call back with any changes in patient condition.

## 2017-10-01 NOTE — Progress Notes (Signed)
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  Patient symptomatic.  eICU Interventions  No intervention at this time Discuss with cardiology in am     Intervention Category Major Interventions: Arrhythmia - evaluation and management  Henry Russel, P 10/01/2017, 3:56 AM

## 2017-10-01 NOTE — Progress Notes (Signed)
STROKE TEAM PROGRESS NOTE   HISTORY OF PRESENT ILLNESS (per record) Dwayne [name] is an 72 y.o. male who has an extensive medical history listed below and is on Plavix and ASA, but no anticoagulation. He was in his usual state of health (MRS 3 for using wlker), when family noted him to have sudden right side weakness. They called EMS and he was very hypertensive with SBP in 200's per PM report. He declined quickly and was not responding upon arrival to ER, GCS 9. Upon emergent bedside evaluation in CT he was noted to have a large left BG/thalamus ICH with IVH extension, volume ~30cc. ICH score 4. He will be admitted for further neurologic care.  LSN: 10am tPA Given: no; Hemorrhage    SUBJECTIVE (INTERVAL HISTORY) Nurse at bedside. CT yesterday stable. Trying to follow commands today and trying to open eyes. Family flying in today.   OBJECTIVE Temp:  [97.5 F (36.4 C)-99.2 F (37.3 C)] 98.1 F (36.7 C) (05/26 0400) Pulse Rate:  [58-111] 87 (05/26 0700) Cardiac Rhythm: Heart block (05/25 2200) Resp:  [16-24] 18 (05/26 0700) BP: (110-157)/(53-86) 154/83 (05/26 0700) SpO2:  [97 %-100 %] 99 % (05/26 0700) FiO2 (%):  [40 %] 40 % (05/26 0400) Weight:  [263 lb 7.2 oz (119.5 kg)] 263 lb 7.2 oz (119.5 kg) (05/26 0207)  CBC:  Recent Labs  Lab 09/29/17 1053 10/01/17 0214  WBC 12.6* 14.6*  NEUTROABS 7.0 11.6*  HGB 14.3 12.6*  HCT 41.5 37.3*  MCV 94.7 96.9  PLT 290 235    Basic Metabolic Panel:  Recent Labs  Lab 09/29/17 1053 09/30/17 1822 10/01/17 0214  NA 138  --  138  K 4.0  --  2.9*  CL 101  --  101  CO2 24  --  28  GLUCOSE 259*  --  184*  BUN 21*  --  19  CREATININE 1.06  --  0.84  CALCIUM 9.4  --  8.8*  MG  --  1.5* 1.7  PHOS  --  2.2* 2.6    Lipid Panel:     Component Value Date/Time   CHOL 118 10/01/2017 0214   TRIG 97 10/01/2017 0214   HDL 62 10/01/2017 0214   CHOLHDL 1.9 10/01/2017 0214   VLDL 19 10/01/2017 0214   LDLCALC 37 10/01/2017 0214   HgbA1c:   Lab Results  Component Value Date   HGBA1C 8.7 (H) 09/29/2017   Urine Drug Screen: No results found for: LABOPIA, COCAINSCRNUR, LABBENZ, AMPHETMU, THCU, LABBARB  Alcohol Level     Component Value Date/Time   ETH <10 09/29/2017 1053    IMAGING  Ct Angio Head W Or Wo Contrast 09/29/2017 IMPRESSION:  1. No acute or focal vascular lesion to explain the intracranial hemorrhage.  2. Atherosclerotic changes in the cavernous internal carotid arteries bilaterally with moderate stenoses relative to the more distal ICA termini, right greater than left.  3. Asymmetric narrowing of the distal left ICA lumen below the skull base suggesting a significant proximal stenosis.  4. No significant change in the parenchymal hemorrhage or intraventricular component.   Ct Head Wo Contrast 09/30/2017 8:13 AM IMPRESSION:  Essentially stable LEFT thalamic/basal ganglia hemorrhage with intraventricular extension. No increase in size, worsening shift, or hydrocephalus.   Ct Head Wo Contrast 09/29/2017  IMPRESSION:  1. Increase size of hematoma within the left basal ganglia. Stable intraventricular hemorrhage with minimal redistribution to the occipital horn of right lateral ventricle.  2. Mild increase in local mass effect. Stable  4 mm left-to-right midline shift.   Ct Head Code Stroke Wo Contrast 09/29/2017 IMPRESSION:  1. 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 Hemorrhage measures 2.5 x 4.2 x 2.0 cm (estimated volume is 88 cm^3).  2. Intraventricular extension of hemorrhage into the left lateral ventricle to the foramen of Monro. There is early dilation of the left lateral ventricle concerning for developing hydrocephalus.  3. 4 mm left right midline shift.     Dg Chest Port 1 View 09/29/2017 IMPRESSION:  Tube and catheter positions as described without evident pneumothorax. Underlying fibrotic change without edema or consolidation. There is aortic atherosclerosis. Stable cardiac  silhouette. Aortic Atherosclerosis   Dg Chest Port 1 View 09/29/2017 IMPRESSION:  Tube and catheter positions as described without evident pneumothorax. Underlying fibrotic change without edema or consolidation. There is aortic atherosclerosis. Stable cardiac silhouette. Aortic Atherosclerosis   Dg Abd 1 View 09/29/2017 IMPRESSION:  Orogastric tube tip and side port in stomach. Visualized bowel appears unremarkable. No obstruction or free air demonstrated.     PHYSICAL EXAM Vitals:   10/01/17 0400 10/01/17 0500 10/01/17 0600 10/01/17 0700  BP: 140/63 (!) 145/61 (!) 145/63 (!) 154/83  Pulse: 68 (!) 58 67 87  Resp: Temp: 98.1 F (36.7 C)     TempSrc: Axillary     SpO2: 99% 99% 97% 99%  Weight:      Height:        PHYSICAL EXAM  intubated . Afebrile. Head is nontraumatic. Neck is supple without bruit. Cardiac exam no murmur or gallop. Lungs are clear to auscultation. Distal pulses are well felt. Neurological Exam : Intubated. Does not open eyes to sternal rub. Globally aphasic.Trying to be more alert and trying to open eyes today when talking to him. Left gaze deviation. Unable to look to the right past midline. Does not blink to threat.  Right lower facial weakness possible but difficult to assess due to ET tube. Does not follow any commands. Tongue midline. Cough and gag positive. Motor system exam reveals spontaneous antigravity and purposeful left upper movement, left arm action tremor.  Right upper extremity flicker  Withdraws bilateral lower extremity to pain but right  less than the left side. Right plantar upgoing left upgoing. Brisk reflexes.  ASSESSMENT/PLAN [name] is a 72 y.o. male with history of coronary artery disease with previous MI, COPD with continued tobacco use, diabetes mellitus, hyperlipidemia, hypertension, depression, and anxiety presenting with onset of right-sided weakness followed by unresponsiveness.  He did not receive IV t-PA due to  ICH.  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 Stable.   CT Head F/U Saturday morning - Essentially stable  MRI head - not indicated  MRA head - not indicated  CTA Head - No acute or focal vascular lesion to explain the ICH. Bilateral carotid disease.  Carotid Doppler - consider ordering  2D Echo - not indicated  LDL - pending  HgbA1c - 8.7  VTE prophylaxis - SCDs  Diet Order           Diet NPO time specified  Diet effective now          aspirin 81 mg daily and clopidogrel 75 mg daily prior to admission, now on No antithrombotic due to bleed  Ongoing aggressive stroke risk factor management  Therapy recommendations:  pending  Disposition:  Pending  Hypertension  Stable . Initial SBP goal < 140 mmHg - IV Cardene . Long-term BP goal normotensive  Hyperlipidemia  Lipid lowering medication PTA:  Lipitor 80 mg daily  LDL will order, goal < 70  Current lipid lowering medication: none - resume Lipitor at time of discharge  Diabetes  HgbA1c 8.7, goal < 7.0  Uncontrolled  Other Stroke Risk Factors  Advanced age  Cigarette smoker will be advised to stop smoking  ETOH use, advised to drink no more than 1 alcoholic beverage per day.  Obesity, Body mass index is 37.8 kg/m., recommend weight loss, diet and exercise as appropriate  Coronary artery disease  Other Active Problems  Bilateral carotid artery disease - consider carotid Dopplers or CTA of neck.  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  Therapy Follow Up: pending  Disposition: pending  Antiplatelet / Anticoagulation:  pending  Statin: pending  MD Follow Up: [facility] in 6-8 weeks with Shanda Bumps  Other: pending  Further risk factor modification per primary care MD: Follow Up 2 weeks   Hospital day # 2  This patient is critically ill and at significant risk of neurological worsening, death and care requires constant monitoring of vital signs, hemodynamics,respiratory and cardiac monitoring,review of multiple databases, neurological assessment, discussion with family, other specialists and medical decision making of high complexity.I  I spent 30 minutes of neurocritical care time in the care of this patient.  Naomie Dean, MD [facility]    To contact Stroke Continuity provider, please refer to [email or web address]. After hours, contact General Neurology

## 2017-10-01 NOTE — Progress Notes (Signed)
[facility] ADULT ICU REPLACEMENT PROTOCOL FOR AM LAB REPLACEMENT ONLY  The patient does apply for the [facility] Adult ICU Electrolyte Replacment Protocol based on the criteria listed below:   1. Is GFR >/= 40 ml/min? Yes.    Patient's GFR today is >60 2. Is urine output >/= 0.5 ml/kg/hr for the last 6 hours? Yes.   Patient's UOP is 0.7 ml/kg/hr 3. Is BUN < 60 mg/dL? Yes.    Patient's BUN today is 19 4. Abnormal electrolyte(s):k 2.9 5. Ordered repletion with: prptocol 6. If a panic level lab has been reported, has the CCM MD in charge been notified? No..   Physician:    Markus Daft A 10/01/2017 5:27 AM

## 2017-10-01 NOTE — Progress Notes (Signed)
Subjective: The patient is intubated and somnolent.  He is in no apparent distress.  Objective: Vital signs in last 24 hours: Temp:  [97.5 F (36.4 C)-100.3 F (37.9 C)] 100.3 F (37.9 C) (05/26 0800) Pulse Rate:  [58-111] 87 (05/26 0700) Resp:  [16-22] 18 (05/26 0700) BP: (110-154)/(53-83) 154/83 (05/26 0700) SpO2:  [96 %-100 %] 96 % (05/26 0829) FiO2 (%):  [40 %] 40 % (05/26 0829) Weight:  [119.5 kg (263 lb 7.2 oz)] 119.5 kg (263 lb 7.2 oz) (05/26 0207) Estimated body mass index is 37.8 kg/m as calculated from the following:   Height as of this encounter:  (1.778 m).   Weight as of this encounter: 119.5 kg (263 lb 7.2 oz).   Intake/Output from previous day: 05/25 0701 - 05/26 0700 In: 2184.5 [I.V.:1625.9; NG/GT:408.7; IV Piggyback:150] Out: 2000 [Urine:2000] Intake/Output this shift: No intake/output data recorded.  Physical exam the patient localizes on the left.  His pupils are equal.  He has right hemiplegic.  Lab Results: Recent Labs    09/29/17 1053 10/01/17 0214  WBC 12.6* 14.6*  HGB 14.3 12.6*  HCT 41.5 37.3*  PLT 290 235   BMET Recent Labs    09/29/17 1053 10/01/17 0214  NA 138 138  K 4.0 2.9*  CL 101 101  CO2 24 28  GLUCOSE 259* 184*  BUN 21* 19  CREATININE 1.06 0.84  CALCIUM 9.4 8.8*    Studies/Results: Ct Angio Head W Or Wo Contrast  Result Date: 09/29/2017 CLINICAL DATA:  Acute left basal ganglia hemorrhage with extension into the left lateral ventricle. EXAM: CT ANGIOGRAPHY HEAD TECHNIQUE: Multidetector CT imaging of the head was performed using the standard protocol during bolus administration of intravenous contrast. Multiplanar CT image reconstructions and MIPs were obtained to evaluate the vascular anatomy. CONTRAST:  50mL ISOVUE-300 IOPAMIDOL (ISOVUE-300) INJECTION 61% COMPARISON:  CT head without contrast from the same day. FINDINGS: CTA HEAD Anterior circulation: Dense atherosclerotic calcifications are present within the cavernous  internal carotid arteries bilaterally. There is tortuosity of the internal carotid arteries below the skull base. The distal cervical ICA lumen is narrowed on the left compared to the right. Atherosclerotic calcifications narrow the right cavernous ICA lumen to less than 1 mm. The lumen of the left internal carotid artery is slightly larger than on the right. The ICA terminus is within normal limits bilaterally. The A1 and M1 segments are normal. The anterior communicating artery is patent. MCA bifurcations are intact. Diffuse small vessel irregularity is present in the ACA and MCA branch vessels without a significant proximal stenosis or occlusion. There are no aneurysms. No vascular malformation is present. Posterior circulation: The left vertebral artery is slightly dominant to the right. PICA origins are visualized and normal. Basilar artery is normal. Both posterior cerebral arteries originate from the basilar tip. There is some attenuation of distal PCA branch vessels without a significant proximal stenosis or occlusion. There is no aneurysm. Venous sinuses: Dural sinuses are patent. Straight sinus deep cerebral veins are intact. Anatomic variants: None IMPRESSION: 1. No acute or focal vascular lesion to explain the intracranial hemorrhage. 2. Atherosclerotic changes in the cavernous internal carotid arteries bilaterally with moderate stenoses relative to the more distal ICA termini, right greater than left. 3. Asymmetric narrowing of the distal left ICA lumen below the skull base suggesting a significant proximal stenosis. 4. No significant change in the parenchymal hemorrhage or intraventricular component. Electronically Signed   By: Marin Roberts M.D.   On: 09/29/2017 12:23  Dg Abd 1 View  Result Date: 09/29/2017 CLINICAL DATA:  Orogastric tube placement EXAM: ABDOMEN - 1 VIEW COMPARISON:  None. FINDINGS: Orogastric tube tip and side port are in the stomach. There is no appreciable bowel  dilatation or air-fluid level to suggest bowel obstruction. No free air appreciable. IMPRESSION: Orogastric tube tip and side port in stomach. Visualized bowel appears unremarkable. No obstruction or free air demonstrated. Electronically Signed   By: Bretta Bang III M.D.   On: 09/29/2017 14:36   Ct Head Wo Contrast  Result Date: 09/30/2017 CLINICAL DATA:  Follow-up intracranial hemorrhage. RIGHT-sided weakness. History of hypertension. EXAM: CT HEAD WITHOUT CONTRAST TECHNIQUE: Contiguous axial images were obtained from the base of the skull through the vertex without intravenous contrast. COMPARISON:  09/29/2017. FINDINGS: Brain: LEFT thalamic/basal ganglia hemorrhage stable to improved. Mild surrounding edema may be minimally increased but there is no worsening of LEFT-to-RIGHT shift. Intraventricular hemorrhage predominantly LEFT-sided, with some redistribution to the RIGHT lateral ventricle. No hydrocephalus. Vascular: Calcification of the cavernous internal carotid arteries consistent with cerebrovascular atherosclerotic disease. No signs of intracranial large vessel occlusion. Skull: Calvarium intact. Sinuses/Orbits: No layering sinus fluid. Other: Mastoids unremarkable. IMPRESSION: Essentially stable LEFT thalamic/basal ganglia hemorrhage with intraventricular extension. No increase in size, worsening shift, or hydrocephalus. Electronically Signed   By: Elsie Stain M.D.   On: 09/30/2017 08:24   Ct Head Wo Contrast  Result Date: 09/29/2017 CLINICAL DATA:  72 y/o  M; follow-up intracranial hemorrhage. EXAM: CT HEAD WITHOUT CONTRAST TECHNIQUE: Contiguous axial images were obtained from the base of the skull through the vertex without intravenous contrast. COMPARISON:  09/29/2017 CT head. FINDINGS: Brain: Increase size of hematoma within the left basal ganglia measuring 4.6 x 4.6 x 2.6 cm (volume = 29 cm^3)(AP x ML x CC series 5, image 37 and series 6, image 40.) stable intraventricular hemorrhage  within the left lateral ventricle with some redistribution to the occipital horn of the right lateral ventricle. Stable 4 mm left-to-right midline shift. Stable ventricle size. No new acute intracranial hemorrhage, stroke, or focal mass effect identified. Vascular: No hyperdense vessel or unexpected calcification. Skull: Normal. Negative for fracture or focal lesion. Sinuses/Orbits: No acute finding. Other: None. IMPRESSION: 1. Increase size of hematoma within the left basal ganglia. Stable intraventricular hemorrhage with minimal redistribution to the occipital horn of right lateral ventricle. 2. Mild increase in local mass effect. Stable 4 mm left-to-right midline shift. These results will be called to the ordering clinician or representative by the Radiologist Assistant, and communication documented in the PACS or zVision Dashboard. Electronically Signed   By: Mitzi Hansen M.D.   On: 09/29/2017 18:18   Dg Chest Port 1 View  Result Date: 09/29/2017 CLINICAL DATA:  Hypoxia EXAM: PORTABLE CHEST 1 VIEW COMPARISON:  Chest radiograph July 14, 2016 and chest CT November 24, 2016 FINDINGS: Endotracheal tube tip is 4.8 cm above the carina. Nasogastric tube tip and side port are below the diaphragm. Central catheter tip is in the superior vena cava. No pneumothorax. There is underlying parenchymal fibrotic type change. There is no edema or consolidation. The heart size is upper normal with pulmonary vascularity within normal limits. There is aortic atherosclerosis. No adenopathy. No evident bone lesions. IMPRESSION: Tube and catheter positions as described without evident pneumothorax. Underlying fibrotic change without edema or consolidation. There is aortic atherosclerosis. Stable cardiac silhouette. Aortic Atherosclerosis (ICD10-I70.0). Electronically Signed   By: Bretta Bang III M.D.   On: 09/29/2017 14:35   Ct Head Code  Stroke Wo Contrast  Result Date: 09/29/2017 CLINICAL DATA:  Code stroke.   Right-sided facial droop. EXAM: CT HEAD WITHOUT CONTRAST TECHNIQUE: Contiguous axial images were obtained from the base of the skull through the vertex without intravenous contrast. COMPARISON:  None. FINDINGS: Brain: Acute left basal ganglia hemorrhage measures 2.5 x 4.2 x 2.0 cm. Estimated volume is 88 cm^3 interventricular hemorrhage scratched at the hemorrhage extends into the left lateral ventricle. There is early dilation of the left lateral ventricle. Midline shift at the foramen of Monro is 4 mm. Hemorrhage extends into the foramen of Monro. There is no hemorrhage in the fourth ventricle or aqueduct. Periventricular white matter changes are present bilaterally. No other focal parenchymal infarct is present. Brainstem and cerebellum are normal. Vascular: Atherosclerotic calcifications are present within the cavernous internal carotid arteries bilaterally. There is no hyperdense vessel. Skull: Calvarium is intact. No focal lytic or blastic lesions are present. Sinuses/Orbits: The paranasal sinuses and the mastoid air cells are clear. Bilateral lens replacements are present. Globes and orbits are otherwise unremarkable. IMPRESSION: 1. 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 Hemorrhage measures 2.5 x 4.2 x 2.0 cm (estimated volume is 88 cm^3). 2. Intraventricular extension of hemorrhage into the left lateral ventricle to the foramen of Monro. There is early dilation of the left lateral ventricle concerning for developing hydrocephalus. 3. 4 mm left right midline shift. Critical Value/emergent results were called by telephone at the time of interpretation on 09/29/2017 at 11:13 am to Dr. Criss Alvine , who verbally acknowledged these results. Electronically Signed   By: Marin Roberts M.D.   On: 09/29/2017 11:15    Assessment/Plan: Basal ganglia hemorrhage: LOS: 2 days  The patient is stable.    Cristi Loron 10/01/2017, 10:24 AM

## 2017-10-01 NOTE — Progress Notes (Signed)
PT Cancellation Note  Patient Details Name: Dwayne [name] MRN: [id number] DOB: 11/09/1945   Cancelled Treatment:    Reason Eval/Treat Not Completed: Active bedrest order;Medical issues which prohibited therapy.  Thanks,    Rollene Rotunda. Ophia Shamoon, PT, DPT [phone]   10/01/2017, 7:09 AM

## 2017-10-02 ENCOUNTER — Inpatient Hospital Stay (HOSPITAL_COMMUNITY): Payer: Medicare Other

## 2017-10-02 DIAGNOSIS — J9601 Acute respiratory failure with hypoxia: Secondary | ICD-10-CM

## 2017-10-02 LAB — BASIC METABOLIC PANEL
Anion gap: 10 (ref 5–15)
BUN: 25 mg/dL — ABNORMAL HIGH (ref 6–20)
CHLORIDE: 102 mmol/L (ref 101–111)
CO2: 27 mmol/L (ref 22–32)
Calcium: 9.2 mg/dL (ref 8.9–10.3)
Creatinine, Ser: 0.82 mg/dL (ref 0.61–1.24)
Glucose, Bld: 201 mg/dL — ABNORMAL HIGH (ref 65–99)
POTASSIUM: 3.8 mmol/L (ref 3.5–5.1)
SODIUM: 139 mmol/L (ref 135–145)

## 2017-10-02 LAB — CBC WITH DIFFERENTIAL/PLATELET
Abs Immature Granulocytes: 0.1 10*3/uL (ref 0.0–0.1)
BASOS ABS: 0.1 10*3/uL (ref 0.0–0.1)
Basophils Relative: 0 %
EOS ABS: 0.1 10*3/uL (ref 0.0–0.7)
Eosinophils Relative: 0 %
HEMATOCRIT: 38.4 % — AB (ref 39.0–52.0)
HEMOGLOBIN: 13.2 g/dL (ref 13.0–17.0)
IMMATURE GRANULOCYTES: 1 %
LYMPHS ABS: 2.5 10*3/uL (ref 0.7–4.0)
LYMPHS PCT: 16 %
MCH: 33.2 pg (ref 26.0–34.0)
MCHC: 34.4 g/dL (ref 30.0–36.0)
MCV: 96.5 fL (ref 78.0–100.0)
Monocytes Absolute: 1.7 10*3/uL — ABNORMAL HIGH (ref 0.1–1.0)
Monocytes Relative: 11 %
NEUTROS PCT: 72 %
Neutro Abs: 11 10*3/uL — ABNORMAL HIGH (ref 1.7–7.7)
Platelets: 250 10*3/uL (ref 150–400)
RBC: 3.98 MIL/uL — AB (ref 4.22–5.81)
RDW: 13.4 % (ref 11.5–15.5)
WBC: 15.3 10*3/uL — AB (ref 4.0–10.5)

## 2017-10-02 LAB — GLUCOSE, CAPILLARY
GLUCOSE-CAPILLARY: 235 mg/dL — AB (ref 65–99)
GLUCOSE-CAPILLARY: 257 mg/dL — AB (ref 65–99)
GLUCOSE-CAPILLARY: 270 mg/dL — AB (ref 65–99)
Glucose-Capillary: 191 mg/dL — ABNORMAL HIGH (ref 65–99)
Glucose-Capillary: 250 mg/dL — ABNORMAL HIGH (ref 65–99)
Glucose-Capillary: 252 mg/dL — ABNORMAL HIGH (ref 65–99)

## 2017-10-02 LAB — MAGNESIUM: Magnesium: 1.9 mg/dL (ref 1.7–2.4)

## 2017-10-02 LAB — PHOSPHORUS: Phosphorus: 3.7 mg/dL (ref 2.5–4.6)

## 2017-10-02 LAB — PROCALCITONIN: PROCALCITONIN: 0.13 ng/mL

## 2017-10-02 LAB — TRIGLYCERIDES: TRIGLYCERIDES: 111 mg/dL (ref <150)

## 2017-10-02 MED ORDER — SODIUM CHLORIDE 0.9% FLUSH
10.0000 mL | Freq: Two times a day (BID) | INTRAVENOUS | Status: DC
  Administered 2017-10-02 – 2017-10-03 (×2): 10 mL

## 2017-10-02 MED ORDER — CHLORHEXIDINE GLUCONATE CLOTH 2 % EX PADS: 6.0000 | MEDICATED_PAD | Freq: Every day | CUTANEOUS | Status: DC

## 2017-10-02 MED ORDER — SODIUM CHLORIDE 0.9% FLUSH: 10.0000 mL | INTRAVENOUS | Status: DC | PRN

## 2017-10-02 MED ORDER — INSULIN GLARGINE 100 UNIT/ML ~~LOC~~ SOLN
9.0000 [IU] | Freq: Every day | SUBCUTANEOUS | Status: DC
  Administered 2017-10-02: 9 [IU] via SUBCUTANEOUS
  Filled 2017-10-02: qty 0.09

## 2017-10-02 MED ORDER — VITAL HIGH PROTEIN PO LIQD
1000.0000 mL | ORAL | Status: DC
  Administered 2017-10-02: 1000 mL

## 2017-10-02 NOTE — Progress Notes (Signed)
Subjective: The patient is sedated and intubated.  He is in no apparent distress.  Objective: Vital signs in last 24 hours: Temp:  [99.3 F (37.4 C)-101 F (38.3 C)] 99.7 F (37.6 C) (05/27 0400) Pulse Rate:  [61-98] 72 (05/27 0600) Resp:  [15-25] 18 (05/27 0600) BP: (115-166)/(47-98) 126/55 (05/27 0600) SpO2:  [95 %-100 %] 96 % (05/27 0600) FiO2 (%):  [40 %] 40 % (05/27 0400) Weight:  [119.7 kg (263 lb 14.3 oz)] 119.7 kg (263 lb 14.3 oz) (05/27 0456) Estimated body mass index is 37.86 kg/m as calculated from the following:   Height as of this encounter:  (1.778 m).   Weight as of this encounter: 119.7 kg (263 lb 14.3 oz).   Intake/Output from previous day: 05/26 0701 - 05/27 0700 In: 2296.9 [I.V.:976.9; NG/GT:920; IV Piggyback:400] Out: 1775 [Urine:1775] Intake/Output this shift: Total I/O In: 490 [NG/GT:440; IV Piggyback:50] Out: 650 [Urine:650]  Physical exam Glasgow Coma Scale 8 intubated, [id number].  He localizes on the left.  His pupils are equal.  I reviewed the patient's follow-up head CT.  There is been no significant change in his left basal angle hemorrhage and intraventricular hemorrhage.  There is no significant hydrocephalus.  Lab Results: Recent Labs    10/01/17 0214 10/02/17 0505  WBC 14.6* 15.3*  HGB 12.6* 13.2  HCT 37.3* 38.4*  PLT 235 250   BMET Recent Labs    10/01/17 2007 10/02/17 0505  NA 136 139  K 3.8 3.8  CL 100* 102  CO2 26 27  GLUCOSE 212* 201*  BUN 20 25*  CREATININE 0.81 0.82  CALCIUM 8.9 9.2    Studies/Results: Ct Head Wo Contrast  Result Date: 10/02/2017 CLINICAL DATA:  Intracranial hemorrhage follow up EXAM: CT HEAD WITHOUT CONTRAST TECHNIQUE: Contiguous axial images were obtained from the base of the skull through the vertex without intravenous contrast. COMPARISON:  09/30/2017 FINDINGS: Brain: Intraparenchymal hematoma centered in the left thalamus and basal ganglia is unchanged in size. There is persistent  intraventricular extension of blood, which is also unchanged. The size and configuration of the ventricles are unchanged. Mass effect on the left lateral ventricle is unchanged. Midline shift remains 3 mm to the right. Hypoattenuating focus in the right frontal white matter is unchanged. Vascular: No abnormal hyperdensity of the major intracranial arteries or dural venous sinuses. No intracranial atherosclerosis. Skull: The visualized skull base, calvarium and extracranial soft tissues are normal. Sinuses/Orbits: No fluid levels or advanced mucosal thickening of the visualized paranasal sinuses. No mastoid or middle ear effusion. The orbits are normal. IMPRESSION: Unchanged examination of intraparenchymal hematoma centered in the left thalamus and basal ganglia with intraventricular extension. Electronically Signed   By: Deatra Robinson M.D.   On: 10/02/2017 02:33   Ct Head Wo Contrast  Result Date: 09/30/2017 CLINICAL DATA:  Follow-up intracranial hemorrhage. RIGHT-sided weakness. History of hypertension. EXAM: CT HEAD WITHOUT CONTRAST TECHNIQUE: Contiguous axial images were obtained from the base of the skull through the vertex without intravenous contrast. COMPARISON:  09/29/2017. FINDINGS: Brain: LEFT thalamic/basal ganglia hemorrhage stable to improved. Mild surrounding edema may be minimally increased but there is no worsening of LEFT-to-RIGHT shift. Intraventricular hemorrhage predominantly LEFT-sided, with some redistribution to the RIGHT lateral ventricle. No hydrocephalus. Vascular: Calcification of the cavernous internal carotid arteries consistent with cerebrovascular atherosclerotic disease. No signs of intracranial large vessel occlusion. Skull: Calvarium intact. Sinuses/Orbits: No layering sinus fluid. Other: Mastoids unremarkable. IMPRESSION: Essentially stable LEFT thalamic/basal ganglia hemorrhage with intraventricular extension. No increase in  size, worsening shift, or hydrocephalus.  Electronically Signed   By: Elsie Stain M.D.   On: 09/30/2017 08:24    Assessment/Plan: Left basal ganglia hemorrhage, intraventricular hemorrhage: The patient is stable.  There is no hydrocephalus.  LOS: 3 days     Cristi Loron 10/02/2017, 6:41 AM

## 2017-10-02 NOTE — Progress Notes (Signed)
PT Cancellation Note  Patient Details Name: Andray Assefa MRN: [id number] DOB: 1945/10/02   Cancelled Treatment:    Reason Eval/Treat Not Completed: Active bedrest order. 10/02/2017  Faribault Bing, PT [phone]  (pager)   Eliseo Gum Kathelyn Gombos 10/02/2017, 4:57 PM

## 2017-10-02 NOTE — Progress Notes (Signed)
Nutrition Follow-up  DOCUMENTATION CODES:   Morbid obesity  INTERVENTION:   Increase Vital High Protein to 60 ml/hr (1440 ml/day) Continue 30 ml Prostat BID  Provides: 1640 kcal, 156 grams protein, and 1203 ml free water    NUTRITION DIAGNOSIS:   Inadequate oral intake related to inability to eat as evidenced by NPO status. Ongoing.   GOAL:   Provide needs based on ASPEN/SCCM guidelines Progressing.   MONITOR:   Vent status, Weight trends, Labs  REASON FOR ASSESSMENT:   Consult Enteral/tube feeding initiation and management  ASSESSMENT:   Pt with PMH of COPD, CAD, DM, HTN, and HLD admitted 5/24 with large L thalamic bleed.  Per MD will try to extubate today but unlikely to swallow after extubation  Patient is currently intubated on ventilator support Temp (24hrs), Avg:99.9 F (37.7 C), Min:99.3 F (37.4 C), Max:101 F (38.3 C)  Propofol: off Medications reviewed and include: SSI, lantus, senokot-s Labs reviewed CBG's: 252-191  OGT in place 5/25 TF started: Vital High Protein @ 40 ml with 30 ml Prostat BID  Provides: 1160 kcal, 114 grams protein  Diet Order:   Diet Order           Diet NPO time specified  Diet effective now          EDUCATION NEEDS:   No education needs have been identified at this time  Skin:  Skin Assessment: (MASD: groin and abdomen)  Last BM:  5/24  Height:   Ht Readings from Last 1 Encounters:  09/29/17  (1.778 m)    Weight:   Wt Readings from Last 1 Encounters:  10/02/17 263 lb 14.3 oz (119.7 kg)    Ideal Body Weight:  75.45 kg  BMI:  Body mass index is 37.86 kg/m.  Estimated Nutritional Needs:   Kcal:  2130-8657 (11-14 kcal/kg)  Protein:  >/= 151 grams (2 grams/kg IBW)  Fluid:  >/= 1.7 L/day  Kendell Bane RD, LDN, CNSC [phone] Pager [phone] After Hours Pager

## 2017-10-02 NOTE — Progress Notes (Signed)
PULMONARY / CRITICAL CARE MEDICINE   Name: Dwayne [name] MRN: [id number] DOB: March 20, 1946    ADMISSION DATE:  09/29/2017 CONSULTATION DATE:  09/29/17  REFERRING MD:  [facility].  CHIEF COMPLAINT:  Obtundation.   HISTORY OF PRESENT ILLNESS:   72 year old man with a history of COPD, coronary artery disease (on aspirin and Plavix), diabetes, hypertension, hyperlipidemia.  He developed acute inability to stand, right-sided weakness on 5/24, was found to be severely hypertensive to the 200s with a suppressed sensorium.  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  He was started on nicardipine for blood pressure management and admitted for further care.  PCCM consulted regarding his lethargy, altered mental status and likely impending respiratory failure.      This is hospital day 3. On no anti hypertensive drip. Still intubated and mechanically ventilated. Sedated on 25 mcg propofol during my visit, not interactive for me.  PAST MEDICAL HISTORY :  He  has a past medical history of Anxiety, Aortic atherosclerosis (HCC) (08/14/2017), Arthritis, Compression fracture of L2 (HCC) (03/08/2013), COPD (chronic obstructive pulmonary disease) (HCC), Coronary artery disease, Depression, Esophageal reflux, Gallstones, Hyperlipidemia, Hypertension, benign, IDDM (insulin dependent diabetes mellitus) (HCC), Myocardial infarction (HCC) (1998 X 2), Palpitations, Pneumonia (~ 2004 X 1), and Swallowing difficulty.  PAST SURGICAL HISTORY: He  has a past surgical history that includes Coronary angioplasty with stent (1999; 2014; 02/12/2014); Coronary angioplasty (1998); Cardiac catheterization (~ 2012); Cataract extraction w/ intraocular lens implant (Right, 10/2013); left heart catheterization with coronary angiogram (N/A, 02/12/2014); and Cholecystectomy (N/A, 03/07/2016).  Allergies  Allergen  Reactions  . Morphine And Related Anaphylaxis    Swelling of the tongue and face    No current facility-administered medications on file prior to encounter.    Current Outpatient Medications on File Prior to Encounter  Medication Sig  . acetaminophen (TYLENOL) 500 MG tablet Take 500 mg by mouth every 6 (six) hours as needed for mild pain.  Marland Kitchen albuterol (PROVENTIL HFA;VENTOLIN HFA) 108 (90 BASE) MCG/ACT inhaler Inhale 2 puffs into the lungs every 6 (six) hours as needed for wheezing.  Marland Kitchen aspirin 81 MG chewable tablet Chew 1 tablet (81 mg total) by mouth daily.  Marland Kitchen atorvastatin (LIPITOR) 80 MG tablet Take 80 mg by mouth daily at 6 PM.   . clonazePAM (KLONOPIN) 0.5 MG tablet Take 0.5 mg by mouth daily.   . clopidogrel (PLAVIX) 75 MG tablet Take 1 tablet (75 mg total) by mouth daily.  . furosemide (LASIX) 20 MG tablet Take 1 tablet (20 mg total) by mouth every other day.  Marland Kitchen GLIPIZIDE XL 10 MG 24 hr tablet Take 10 mg by mouth daily.   . hydrochlorothiazide (HYDRODIURIL) 25 MG tablet Take 25 mg by mouth every other day.   . isosorbide mononitrate (IMDUR) 30 MG 24 hr tablet Take 1 tablet (30 mg total) by mouth daily.  Marland Kitchen lisinopril (PRINIVIL,ZESTRIL) 40 MG tablet Take 40 mg by mouth daily.  . metFORMIN (GLUCOPHAGE) 1000 MG tablet Take 1 tablet (1,000 mg total) by mouth 2 (two) times daily with a meal.  . nitroGLYCERIN (NITROSTAT) 0.4 MG SL tablet Place 0.4 mg under the tongue every 5 (five) minutes as needed for chest pain (x 3 doses).  . pantoprazole (PROTONIX) 40 MG tablet Take 1 tablet (40 mg total) by mouth daily.  . Probiotic Product (PROBIOTIC PO) Take 1 tablet by mouth daily.  . sertraline (  ZOLOFT) 100 MG tablet Take 200 mg by mouth at bedtime.     FAMILY HISTORY:  His indicated that his mother is deceased. He indicated that his father is deceased. He indicated that the status of his neg hx is unknown.   SOCIAL HISTORY: He  reports that he has been smoking cigarettes.  He has a 25.00  pack-year smoking history. He has never used smokeless tobacco. He reports that he drinks alcohol. He reports that he does not use drugs.  REVIEW OF SYSTEMS:   Unable to obtain.  SUBJECTIVE:  Purposeful when off sedation.  VITAL SIGNS: BP (!) 126/55   Pulse 72   Temp 99.7 F (37.6 C) (Axillary)   Resp 18   Ht  (1.778 m)   Wt 263 lb 14.3 oz (119.7 kg)   SpO2 96%   BMI 37.86 kg/m   HEMODYNAMICS:    VENTILATOR SETTINGS: Vent Mode: PRVC FiO2 (%):  [40 %] 40 % Set Rate:  [20 bmp] 20 bmp Vt Set:  [580 mL] 580 mL PEEP:  [5 cmH20] 5 cmH20 Pressure Support:  [5 cmH20] 5 cmH20 Plateau Pressure:  [14 cmH20] 14 cmH20  INTAKE / OUTPUT: I/O last 3 completed shifts: In: 3395.2 [I.V.:1526.5; NG/GT:1368.7; IV Piggyback:500] Out: 2600 [Urine:2600]  PHYSICAL EXAMINATION: General: Orally intubated, mechanically ventilated, and in no distress.   Neuro: My exam was performed on 25 mcg of propofol.  There is some spontaneous movement of the left upper extremity but no eye opening.  He does not respond to voice nor does he open eyes to sternal rub.  He does not move the right extremities to noxious stimuli.  Pupils are equal and EOMs are full  HEENT:  ETT ang OGT in place. Cardiovascular: S1 and S2 are somewhat distant and regular without murmur rub or gallop there is no dependent edema  Lungs: He is breathing above the set ventilator rate.  There is symmetric air movement no wheezes, no rhonchi.  There is good air movement throughout  Abdomen: The abdomen is obese soft and nontender  Musculoskeletal:  No active joints  Skin:  No abrasions.  LABS:  BMET Recent Labs  Lab 10/01/17 0214 10/01/17 2007 10/02/17 0505  NA 138 136 139  K 2.9* 3.8 3.8  CL 101 100* 102  CO2 BUN 19 20 25*  CREATININE 0.84 0.81 0.82  GLUCOSE 184* 212* 201*    Electrolytes Recent Labs  Lab 10/01/17 0214 10/01/17 1801 10/01/17 2007 10/02/17 0505  CALCIUM 8.8*  --  8.9 9.2  MG 1.7 1.8   --  1.9  PHOS 2.6 2.2*  --  3.7    CBC Recent Labs  Lab 09/29/17 1053 10/01/17 0214 10/02/17 0505  WBC 12.6* 14.6* 15.3*  HGB 14.3 12.6* 13.2  HCT 41.5 37.3* 38.4*  PLT 290 235 250    Coag's Recent Labs  Lab 09/29/17 1053  APTT 27  INR 0.97    Sepsis Markers Recent Labs  Lab 09/29/17 1056  LATICACIDVEN 2.35*    ABG Recent Labs  Lab 09/29/17 1542  PHART 7.353  PCO2ART 49.3*  PO2ART 493.0*    Liver Enzymes Recent Labs  Lab 09/29/17 1053  AST 17  ALT 17  ALKPHOS 62  BILITOT 1.0  ALBUMIN 4.0    Cardiac Enzymes No results for input(s): TROPONINI, PROBNP in the last 168 hours.  Glucose Recent Labs  Lab 10/01/17 0801 10/01/17 1325 10/01/17 1540 10/01/17 1935 10/02/17 0010 10/02/17 0403  GLUCAP  222* 270* 243* 228* 252* 191*    Imaging Ct Head Wo Contrast  Result Date: 10/02/2017 CLINICAL DATA:  Intracranial hemorrhage follow up EXAM: CT HEAD WITHOUT CONTRAST TECHNIQUE: Contiguous axial images were obtained from the base of the skull through the vertex without intravenous contrast. COMPARISON:  09/30/2017 FINDINGS: Brain: Intraparenchymal hematoma centered in the left thalamus and basal ganglia is unchanged in size. There is persistent intraventricular extension of blood, which is also unchanged. The size and configuration of the ventricles are unchanged. Mass effect on the left lateral ventricle is unchanged. Midline shift remains 3 mm to the right. Hypoattenuating focus in the right frontal white matter is unchanged. Vascular: No abnormal hyperdensity of the major intracranial arteries or dural venous sinuses. No intracranial atherosclerosis. Skull: The visualized skull base, calvarium and extracranial soft tissues are normal. Sinuses/Orbits: No fluid levels or advanced mucosal thickening of the visualized paranasal sinuses. No mastoid or middle ear effusion. The orbits are normal. IMPRESSION: Unchanged examination of intraparenchymal hematoma centered  in the left thalamus and basal ganglia with intraventricular extension. Electronically Signed   By: Deatra Robinson M.D.   On: 10/02/2017 02:33     STUDIES:  N/A  CULTURES: Results for orders placed or performed during the hospital encounter of 09/29/17  MRSA PCR Screening     Status: None   Collection Time: 09/29/17 12:33 PM  Result Value Ref Range Status   MRSA by PCR NEGATIVE NEGATIVE Final    Comment:        The GeneXpert MRSA Assay (FDA approved for NASAL specimens only), is one component of a comprehensive MRSA colonization surveillance program. It is not intended to diagnose MRSA infection nor to guide or monitor treatment for MRSA infections. Performed at [facility], [address], [place], [place] [address]      ANTIBIOTICS: Anti-infectives (From admission, onward)   None      SIGNIFICANT EVENTS: Intubated 5/24 for airway protection.  LINES/TUBES:  Drain   External Urinary Catheter 443 days  NG/OG Tube Orogastric Center mouth Xray 1 day  External Urinary Catheter less than 1 day     Airway   Airway 7.5 mm 1 day     Wound   Incision (Closed) 03/07/16 Abdomen Other (Comment) 572 days  Incision - 4 Ports Abdomen 1: Umbilicus 2: Superior;Medial 3: Right;Mid;Lateral 4: Right;Mid;Upper 572 days     CVC Line   CVC Triple Lumen 09/29/17 Left Subclavian 1 day     PIV Line   Peripheral IV 07/14/16 Right Forearm 443 days  Peripheral IV 09/29/17 Left Antecubital 1 day  Peripheral IV 09/29/17 Right Hand 1 day     DISCUSSION: Acute deep white matter hemorrhage consistent with a hypertensive bleed.  ICH score: 3 . Currently stable CT appearance with minimal associated edema. Longer term prognosis is guarded.   ASSESSMENT / PLAN:  PULMONARY A: Acute hypoxic respiratory failure due to inability to protect airway.  Tolerating mechanical ventilation. P:   Exam today is benign.  I am anticipating weaning to CPAP today.  I will reexamine the  patient off of sedation to determine whether there is any prospect for safely extubating the patient.   VAP bundle   CARDIOVASCULAR A:  Currently blood pressure is well trolled without need for a Cardene infusion.   A:   Volume Status: Clinically euvolemic P:   Continue current fluid strategy. Marland Kitchen   GASTROINTESTINAL A:   Nutritional Status: At moderate nutritional risk P:   Initiate enteral  feeding.  Will likely need PEG tube.  HEMATOLOGIC A:   At high DVT risk DVT prophylaxis: Mechanical prophylaxis for now.  Initiate chemical prophylaxis a day 7 post bleed.; Transfusion requirements: No P:  As above  INFECTIOUS A:   Mild reactive leukocytosis no signs of infection P:   He still has a significant leukocytosis which I am most suspect is secondary to his intracranial hemorrhage  Line Removal: All lines required  ENDOCRINE A:   Type 2 diabetes mellitus on oral hypoglycemic agents at home Glycemic control: Marginal glycemic control; Stress steroids: No stress steroids P:   Glycemic control remains marginal.  Increasing basal insulin   NEUROLOGIC A:   High-grade intracranial hemorrhage.  High risk of long-term disability.  At risk for worsening cerebral edema. P:   RASS goal: Nightly hour neurochecks with sedation interruption for each check Minimize sedation. Consider hypertonic saline if show signs of increasing cerebral edema.  Greater than 32 minutes was spent in the care of this patient today was suffered from a life-threatening intracranial hemorrhage.  Penny Pia, MD Critical Care Medicine [facility] Pager: [phone] After hours [phone]  10/02/2017, 7:07 AM

## 2017-10-02 NOTE — Progress Notes (Signed)
OT Cancellation Note  Patient Details Name: Dwayne [name] MRN: [id number] DOB: [birth year]   Cancelled Treatment:    Reason Eval/Treat Not Completed: Patient not medically ready.  Pt remains on bedrest.   Jeani Hawking, OTR/L [phone]   Jeani Hawking M 10/02/2017, 8:33 AM

## 2017-10-02 NOTE — Progress Notes (Signed)
STROKE TEAM PROGRESS NOTE   HISTORY OF PRESENT ILLNESS (per record) Dwayne [name] is an 72 y.o. male who has an extensive medical history listed below and is on Plavix and ASA, but no anticoagulation. He was in his usual state of health (MRS 3 for using wlker), when family noted him to have sudden right side weakness. They called EMS and he was very hypertensive with SBP in 200's per PM report. He declined quickly and was not responding upon arrival to ER, GCS 9. Upon emergent bedside evaluation in CT he was noted to have a large left BG/thalamus ICH with IVH extension, volume ~30cc. ICH score 4. He will be admitted for further neurologic care.  LSN: 10am tPA Given: no; Hemorrhage    SUBJECTIVE (INTERVAL HISTORY) No family members present. The patient is intubated. He has a left gaze preference. He follows minimal commands.   OBJECTIVE Temp:  [99.1 F (37.3 C)-101 F (38.3 C)] 99.1 F (37.3 C) (05/27 1200) Pulse Rate:  [61-92] 63 (05/27 1400) Cardiac Rhythm: Heart block (05/27 0800) Resp:  [16-26] 17 (05/27 1400) BP: (119-177)/(47-73) 132/60 (05/27 1400) SpO2:  [95 %-99 %] 98 % (05/27 1400) FiO2 (%):  [40 %] 40 % (05/27 1349) Weight:  [263 lb 14.3 oz (119.7 kg)] 263 lb 14.3 oz (119.7 kg) (05/27 0456)  CBC:  Recent Labs  Lab 10/01/17 0214 10/02/17 0505  WBC 14.6* 15.3*  NEUTROABS 11.6* 11.0*  HGB 12.6* 13.2  HCT 37.3* 38.4*  MCV 96.9 96.5  PLT 235 250    Basic Metabolic Panel:  Recent Labs  Lab 10/01/17 1801 10/01/17 2007 10/02/17 0505  NA  --  136 139  K  --  3.8 3.8  CL  --  100* 102  CO2  --  26 27  GLUCOSE  --  212* 201*  BUN  --  20 25*  CREATININE  --  0.81 0.82  CALCIUM  --  8.9 9.2  MG 1.8  --  1.9  PHOS 2.2*  --  3.7    Lipid Panel:     Component Value Date/Time   CHOL 118 10/01/2017 0214   TRIG 111 10/02/2017 0505   HDL 62 10/01/2017 0214   CHOLHDL 1.9 10/01/2017 0214   VLDL 19 10/01/2017 0214   LDLCALC 37 10/01/2017 0214   HgbA1c:  Lab  Results  Component Value Date   HGBA1C 8.7 (H) 09/29/2017   Urine Drug Screen:     Component Value Date/Time   LABOPIA NONE DETECTED 10/01/2017 0715   COCAINSCRNUR NONE DETECTED 10/01/2017 0715   LABBENZ NONE DETECTED 10/01/2017 0715   AMPHETMU NONE DETECTED 10/01/2017 0715   THCU NONE DETECTED 10/01/2017 0715   LABBARB NONE DETECTED 10/01/2017 0715    Alcohol Level     Component Value Date/Time   ETH <10 09/29/2017 1053    IMAGING  Ct Angio Head W Or Wo Contrast 09/29/2017 IMPRESSION:  1. No acute or focal vascular lesion to explain the intracranial hemorrhage.  2. Atherosclerotic changes in the cavernous internal carotid arteries bilaterally with moderate stenoses relative to the more distal ICA termini, right greater than left.  3. Asymmetric narrowing of the distal left ICA lumen below the skull base suggesting a significant proximal stenosis.  4. No significant change in the parenchymal hemorrhage or intraventricular component.   Ct Head Wo Contrast 09/30/2017 8:13 AM IMPRESSION:  Essentially stable LEFT thalamic/basal ganglia hemorrhage with intraventricular extension. No increase in size, worsening shift, or hydrocephalus.   Ct Head  Wo Contrast 09/29/2017  IMPRESSION:  1. Increase size of hematoma within the left basal ganglia. Stable intraventricular hemorrhage with minimal redistribution to the occipital horn of right lateral ventricle.  2. Mild increase in local mass effect. Stable 4 mm left-to-right midline shift.   Ct Head Code Stroke Wo Contrast 09/29/2017 IMPRESSION:  1. 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 Hemorrhage measures 2.5 x 4.2 x 2.0 cm (estimated volume is 88 cm^3).  2. Intraventricular extension of hemorrhage into the left lateral ventricle to the foramen of Monro. There is early dilation of the left lateral ventricle concerning for developing hydrocephalus.  3. 4 mm left right midline shift.    Ct Head Wo  Contrast 10/02/2017 IMPRESSION:  Unchanged examination of intraparenchymal hematoma centered in the left thalamus and basal ganglia with intraventricular extension.    Dg Chest Port 1 View 09/29/2017 IMPRESSION:  Tube and catheter positions as described without evident pneumothorax. Underlying fibrotic change without edema or consolidation. There is aortic atherosclerosis. Stable cardiac silhouette. Aortic Atherosclerosis   Dg Chest Port 1 View 09/29/2017 IMPRESSION:  Tube and catheter positions as described without evident pneumothorax. Underlying fibrotic change without edema or consolidation. There is aortic atherosclerosis. Stable cardiac silhouette. Aortic Atherosclerosis   Dg Abd 1 View 09/29/2017 IMPRESSION:  Orogastric tube tip and side port in stomach. Visualized bowel appears unremarkable. No obstruction or free air demonstrated.     PHYSICAL EXAM Vitals:   10/02/17 1200 10/02/17 1300 10/02/17 1349 10/02/17 1400  BP: (!) 122/58 (!) 128/54  132/60  Pulse: 63 64  63  Resp: Temp: 99.1 F (37.3 C)     TempSrc: Axillary     SpO2: 98% 98% 99% 98%  Weight:      Height:        PHYSICAL EXAM  intubated . Afebrile. Head is nontraumatic. Neck is supple without bruit. Cardiac exam no murmur or gallop. Lungs are clear to auscultation. Distal pulses are well felt. Neurological Exam : Intubated. Does not open eyes to sternal rub. Globally aphasic.Trying to be more alert and trying to open eyes today when talking to him. Left gaze deviation. Unable to look to the right past midline. Does not blink to threat.  Right lower facial weakness possible but difficult to assess due to ET tube. Does not follow any commands. Tongue midline. Cough and gag positive. Motor system exam reveals spontaneous antigravity and purposeful left upper movement, left arm action tremor.  Right upper extremity flicker  Withdraws bilateral lower extremity to pain but right  less than the left side.  Right plantar upgoing left upgoing. Brisk reflexes.  ASSESSMENT/PLAN [name] is a 72 y.o. male with history of coronary artery disease with previous MI, COPD with continued tobacco use, diabetes mellitus, hyperlipidemia, hypertension, depression, and anxiety presenting with onset of right-sided weakness followed by unresponsiveness.  He did not receive IV t-PA due to ICH.  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 Stable.   CT Head F/U Saturday morning - Essentially stable  Ct Head Wo Contrast - 10/02/2017 - Mon - Unchanged  MRI head - not indicated  MRA head - not indicated  CTA Head - No acute or focal vascular lesion to explain the ICH. Bilateral carotid disease.  Carotid Doppler - will order  2D  Echo - not indicated  LDL - 37  HgbA1c - 8.7  VTE prophylaxis - SCDs  Diet Order           Diet NPO time specified  Diet effective now          aspirin 81 mg daily and clopidogrel 75 mg daily prior to admission, now on No antithrombotic due to bleed  Ongoing aggressive stroke risk factor management  Therapy recommendations:  pending  Disposition:  Pending  Hypertension  Stable . Initial SBP goal < 140 mmHg - IV Cardene . Long-term BP goal normotensive  Hyperlipidemia  Lipid lowering medication PTA:  Lipitor 80 mg daily  LDL 37, goal < 70  Current lipid lowering medication: none - resume Lipitor at time of discharge  Diabetes  HgbA1c 8.7, goal < 7.0  Uncontrolled  Other Stroke Risk Factors  Advanced age  Cigarette smoker will be advised to stop smoking  ETOH use, advised to drink no more than 1 alcoholic beverage per day.  Obesity, Body mass index is 37.86 kg/m., recommend weight loss, diet and exercise as appropriate  Coronary artery disease  Other Active  Problems  Bilateral carotid artery disease - consider carotid Dopplers or CTA of neck.  Leukocytosis - 12.6 - 14.6 - 15.3 - recheck in AM (temp 99.1) CXR pending   Morphine allergy  Anxiety and depression history - consider resuming Zoloft  NPO - On TF - Per Dr Wallace Cullens - Will likely need PEG tube.   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  Therapy Follow Up: pending  Disposition: pending  Antiplatelet / Anticoagulation: currently none  Statin: resume Lipitor at time of discharge  MD Follow Up: [facility] in 6-8 weeks with Shanda Bumps  Other: pending  Further risk factor modification per primary care MD: Follow Up 2 weeks   Hospital day # 3  Delton See PA-C [facility] Pager [phone] 10/02/2017, 2:58 PM  I have personally examined this patient, reviewed notes, independently viewed imaging studies, participated in medical decision making and plan of care.ROS completed by me personally and pertinent positives fully documented  I have made any additions or clarifications directly to the above note. Agree with note above. No family at bedside. D/W RN and Dr Wallace Cullens. Plan slow wean over next few days.This patient is critically ill and at significant risk of neurological worsening, death and care requires constant monitoring of vital signs, hemodynamics,respiratory and cardiac monitoring, extensive review of multiple databases, frequent neurological assessment, discussion with family, other specialists and medical decision making of high complexity.I have made any additions or clarifications directly to the above note.This critical care time does not reflect procedure time, or teaching time or supervisory time of PA/NP/Med Resident etc but could involve care discussion time.  I spent 30 minutes of neurocritical care time  in the care of  this patient.     Delia Heady,  MD Medical Director [facility] Pager: [phone] 10/02/2017 3:25 PM      To contact Stroke Continuity provider, please refer to [email or web address]. After hours, contact General Neurology

## 2017-10-02 NOTE — Progress Notes (Addendum)
PULMONARY / CRITICAL CARE MEDICINE   Name: Dwayne [name] MRN: [id number] DOB: Feb 13, 1946    ADMISSION DATE:  09/29/2017 CONSULTATION DATE:  09/29/17  REFERRING MD:  [facility] Neurology.  CHIEF COMPLAINT:  Obtundation.   HISTORY OF PRESENT ILLNESS:   72 year old man with a history of COPD, coronary artery disease (on aspirin and Plavix), diabetes, hypertension, hyperlipidemia.  He developed acute inability to stand, right-sided weakness on 5/24, was found to be severely hypertensive to the 200s with a suppressed sensorium.  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  He was started on nicardipine for blood pressure management and admitted for further care.  PCCM consulted regarding his lethargy, altered mental status and likely impending respiratory failure.  PAST MEDICAL HISTORY :  He  has a past medical history of Anxiety, Aortic atherosclerosis (HCC) (08/14/2017), Arthritis, Compression fracture of L2 (HCC) (03/08/2013), COPD (chronic obstructive pulmonary disease) (HCC), Coronary artery disease, Depression, Esophageal reflux, Gallstones, Hyperlipidemia, Hypertension, benign, IDDM (insulin dependent diabetes mellitus) (HCC), Myocardial infarction (HCC) (1998 X 2), Palpitations, Pneumonia (~ 2004 X 1), and Swallowing difficulty.  PAST SURGICAL HISTORY: He  has a past surgical history that includes Coronary angioplasty with stent (1999; 2014; 02/12/2014); Coronary angioplasty (1998); Cardiac catheterization (~ 2012); Cataract extraction w/ intraocular lens implant (Right, 10/2013); left heart catheterization with coronary angiogram (N/A, 02/12/2014); and Cholecystectomy (N/A, 03/07/2016).  Allergies  Allergen Reactions  . Morphine And Related Anaphylaxis    Swelling of the tongue and face    No current facility-administered medications on file prior to encounter.    Current  Outpatient Medications on File Prior to Encounter  Medication Sig  . acetaminophen (TYLENOL) 500 MG tablet Take 500 mg by mouth every 6 (six) hours as needed for mild pain.  Marland Kitchen albuterol (PROVENTIL HFA;VENTOLIN HFA) 108 (90 BASE) MCG/ACT inhaler Inhale 2 puffs into the lungs every 6 (six) hours as needed for wheezing.  Marland Kitchen aspirin 81 MG chewable tablet Chew 1 tablet (81 mg total) by mouth daily.  Marland Kitchen atorvastatin (LIPITOR) 80 MG tablet Take 80 mg by mouth daily at 6 PM.   . clonazePAM (KLONOPIN) 0.5 MG tablet Take 0.5 mg by mouth daily.   . clopidogrel (PLAVIX) 75 MG tablet Take 1 tablet (75 mg total) by mouth daily.  . furosemide (LASIX) 20 MG tablet Take 1 tablet (20 mg total) by mouth every other day.  Marland Kitchen GLIPIZIDE XL 10 MG 24 hr tablet Take 10 mg by mouth daily.   . hydrochlorothiazide (HYDRODIURIL) 25 MG tablet Take 25 mg by mouth every other day.   . isosorbide mononitrate (IMDUR) 30 MG 24 hr tablet Take 1 tablet (30 mg total) by mouth daily.  Marland Kitchen lisinopril (PRINIVIL,ZESTRIL) 40 MG tablet Take 40 mg by mouth daily.  . metFORMIN (GLUCOPHAGE) 1000 MG tablet Take 1 tablet (1,000 mg total) by mouth 2 (two) times daily with a meal.  . nitroGLYCERIN (NITROSTAT) 0.4 MG SL tablet Place 0.4 mg under the tongue every 5 (five) minutes as needed for chest pain (x 3 doses).  . pantoprazole (PROTONIX) 40 MG tablet Take 1 tablet (40 mg total) by mouth daily.  . Probiotic Product (PROBIOTIC PO) Take 1 tablet by mouth daily.  . sertraline (ZOLOFT) 100 MG tablet Take 200 mg by mouth at bedtime.     FAMILY HISTORY:  His indicated that his mother is deceased. He indicated that his father is deceased.  He indicated that the status of his neg hx is unknown.   SOCIAL HISTORY: He  reports that he has been smoking cigarettes.  He has a 25.00 pack-year smoking history. He has never used smokeless tobacco. He reports that he drinks alcohol. He reports that he does not use drugs.  REVIEW OF SYSTEMS:   Unable to  obtain.  SUBJECTIVE:  Purposeful when off sedation.  VITAL SIGNS: BP (!) 126/55   Pulse 72   Temp 99.7 F (37.6 C) (Axillary)   Resp 18   Ht  (1.778 m)   Wt 263 lb 14.3 oz (119.7 kg)   SpO2 96%   BMI 37.86 kg/m   HEMODYNAMICS:    VENTILATOR SETTINGS: Vent Mode: PRVC FiO2 (%):  [40 %] 40 % Set Rate:  [20 bmp] 20 bmp Vt Set:  [580 mL] 580 mL PEEP:  [5 cmH20] 5 cmH20 Pressure Support:  [5 cmH20] 5 cmH20 Plateau Pressure:  [14 cmH20] 14 cmH20  INTAKE / OUTPUT: I/O last 3 completed shifts: In: 3395.2 [I.V.:1526.5; NG/GT:1368.7; IV Piggyback:500] Out: 2600 [Urine:2600]  PHYSICAL EXAMINATION: General:  Appears older than stated age. Neuro:  No spontaneous eye opening. L gaze preference. Pupil symmetric. Extensor response RUE. Flexor elsewhere. Moves L spontaneously. Plantar upgoing.  More purposeful on wake-up assessment. HEENT:  ETT ang OGT in place. Cardiovascular:  Hypertensive. Extremities warm. No JVD. S1S2 normal with no murmurs. Lungs:  Synchronous with ventilator. Chest clear. Abdomen:  Soft non-tender. Musculoskeletal:  No active joints  Skin:  No abrasions.  LABS:  BMET Recent Labs  Lab 10/01/17 0214 10/01/17 2007 10/02/17 0505  NA 138 136 139  K 2.9* 3.8 3.8  CL 101 100* 102  CO2 BUN 19 20 25*  CREATININE 0.84 0.81 0.82  GLUCOSE 184* 212* 201*    Electrolytes Recent Labs  Lab 10/01/17 0214 10/01/17 1801 10/01/17 2007 10/02/17 0505  CALCIUM 8.8*  --  8.9 9.2  MG 1.7 1.8  --  1.9  PHOS 2.6 2.2*  --  3.7    CBC Recent Labs  Lab 09/29/17 1053 10/01/17 0214 10/02/17 0505  WBC 12.6* 14.6* 15.3*  HGB 14.3 12.6* 13.2  HCT 41.5 37.3* 38.4*  PLT 290 235 250    Coag's Recent Labs  Lab 09/29/17 1053  APTT 27  INR 0.97    Sepsis Markers Recent Labs  Lab 09/29/17 1056  LATICACIDVEN 2.35*    ABG Recent Labs  Lab 09/29/17 1542  PHART 7.353  PCO2ART 49.3*  PO2ART 493.0*    Liver Enzymes Recent Labs  Lab  09/29/17 1053  AST 17  ALT 17  ALKPHOS 62  BILITOT 1.0  ALBUMIN 4.0    Cardiac Enzymes No results for input(s): TROPONINI, PROBNP in the last 168 hours.  Glucose Recent Labs  Lab 10/01/17 0801 10/01/17 1325 10/01/17 1540 10/01/17 1935 10/02/17 0010 10/02/17 0403  GLUCAP 222* 270* 243* 228* 252* 191*    Imaging Ct Head Wo Contrast  Result Date: 10/02/2017 CLINICAL DATA:  Intracranial hemorrhage follow up EXAM: CT HEAD WITHOUT CONTRAST TECHNIQUE: Contiguous axial images were obtained from the base of the skull through the vertex without intravenous contrast. COMPARISON:  09/30/2017 FINDINGS: Brain: Intraparenchymal hematoma centered in the left thalamus and basal ganglia is unchanged in size. There is persistent intraventricular extension of blood, which is also unchanged. The size and configuration of the ventricles are unchanged. Mass effect on the left lateral ventricle is unchanged. Midline shift remains 3 mm to  the right. Hypoattenuating focus in the right frontal white matter is unchanged. Vascular: No abnormal hyperdensity of the major intracranial arteries or dural venous sinuses. No intracranial atherosclerosis. Skull: The visualized skull base, calvarium and extracranial soft tissues are normal. Sinuses/Orbits: No fluid levels or advanced mucosal thickening of the visualized paranasal sinuses. No mastoid or middle ear effusion. The orbits are normal. IMPRESSION: Unchanged examination of intraparenchymal hematoma centered in the left thalamus and basal ganglia with intraventricular extension. Electronically Signed   By: Deatra Robinson M.D.   On: 10/02/2017 02:33     STUDIES:  N/A  CULTURES: Results for orders placed or performed during the hospital encounter of 09/29/17  MRSA PCR Screening     Status: None   Collection Time: 09/29/17 12:33 PM  Result Value Ref Range Status   MRSA by PCR NEGATIVE NEGATIVE Final    Comment:        The GeneXpert MRSA Assay (FDA approved  for NASAL specimens only), is one component of a comprehensive MRSA colonization surveillance program. It is not intended to diagnose MRSA infection nor to guide or monitor treatment for MRSA infections. Performed at [facility], [address] [place], [place] [address]      ANTIBIOTICS: Anti-infectives (From admission, onward)   None      SIGNIFICANT EVENTS: Intubated 5/24 for airway protection.  LINES/TUBES:  Drain   External Urinary Catheter 443 days  NG/OG Tube Orogastric Center mouth Xray 1 day  External Urinary Catheter less than 1 day     Airway   Airway 7.5 mm 1 day     Wound   Incision (Closed) 03/07/16 Abdomen Other (Comment) 572 days  Incision - 4 Ports Abdomen 1: Umbilicus 2: Superior;Medial 3: Right;Mid;Lateral 4: Right;Mid;Upper 572 days     CVC Line   CVC Triple Lumen 09/29/17 Left Subclavian 1 day     PIV Line   Peripheral IV 07/14/16 Right Forearm 443 days  Peripheral IV 09/29/17 Left Antecubital 1 day  Peripheral IV 09/29/17 Right Hand 1 day     DISCUSSION: Acute deep white matter hemorrhage consistent with a hypertensive bleed.  ICH score: 3 . Currently stable CT appearance with minimal associated edema. Longer term prognosis is guarded.   ASSESSMENT / PLAN:  PULMONARY A: Acute hypoxic respiratory failure due to inability to protect airway.  Tolerating mechanical ventilation. P:   Wean to pressure support ventilation.  Will likely require tracheostomy if survives the acute period. VAP bundle   CARDIOVASCULAR A:  Hypertensive on Cardene infusion. P:  Initiate enteral antihypertensive medication. Secondary Prevention: n/a  RENAL A:   Volume Status: Clinically euvolemic P:   Continue current fluid strategy. Correct hypokalemia Foley: Urinary catheter required to precisely measure urine output.   GASTROINTESTINAL A:   Nutritional Status: At moderate nutritional risk P:   Initiate enteral feeding.  Will likely  need PEG tube.  HEMATOLOGIC A:   At high DVT risk DVT prophylaxis: Mechanical prophylaxis for now.  Initiate chemical prophylaxis a day 7 post bleed.; Transfusion requirements: No P:  As above  INFECTIOUS A:   Mild reactive leukocytosis no signs of infection P:   No need for antibiotics Line Removal: All lines required  ENDOCRINE A:   Type 2 diabetes mellitus on oral hypoglycemic agents at home Glycemic control: Marginal glycemic control; Stress steroids: No stress steroids P:   Add basal insulin to current sliding scale coverage  NEUROLOGIC A:   High-grade intracranial hemorrhage.  High risk of long-term  disability.  At risk for worsening cerebral edema. P:   RASS goal: Nightly hour neurochecks with sedation interruption for each check Minimize sedation. Consider hypertonic saline if show signs of increasing cerebral edema.   FAMILY  - Updates: Wife was updated by nurse earlier on today  - Inter-disciplinary family meet or Palliative Care meeting due by:  day 7  CRITICAL CARE Performed by: Lynnell Catalan  Total critical care time: 40 minutes  Critical care time was exclusive of separately billable procedures and treating other patients.  Critical care was necessary to treat or prevent imminent or life-threatening deterioration.  Critical care was time spent personally by me on the following activities: development of treatment plan with patient and/or surrogate as well as nursing, discussions with consultants, evaluation of patient's response to treatment, examination of patient, obtaining history from patient or surrogate, ordering and performing treatments and interventions, ordering and review of laboratory studies, ordering and review of radiographic studies, pulse oximetry and re-evaluation of patient's condition.  Lynnell Catalan, MD [facility] Pulmonary and Critical Care Medicine [facility] Pager: [phone] After hours [phone]  10/02/2017, 7:01  AM

## 2017-10-03 ENCOUNTER — Inpatient Hospital Stay (HOSPITAL_COMMUNITY): Payer: Medicare Other

## 2017-10-03 DIAGNOSIS — J9601 Acute respiratory failure with hypoxia: Secondary | ICD-10-CM

## 2017-10-03 LAB — PROCALCITONIN: Procalcitonin: <0.1 ng/mL

## 2017-10-03 LAB — BASIC METABOLIC PANEL
Anion gap: 8 (ref 5–15)
BUN: 33 mg/dL — ABNORMAL HIGH (ref 6–20)
CALCIUM: 9.3 mg/dL (ref 8.9–10.3)
CHLORIDE: 103 mmol/L (ref 101–111)
CO2: 29 mmol/L (ref 22–32)
CREATININE: 0.79 mg/dL (ref 0.61–1.24)
GFR calc Af Amer: >60 mL/min (ref >60)
GFR calc non Af Amer: >60 mL/min (ref >60)
GLUCOSE: 277 mg/dL — AB (ref 65–99)
Potassium: 3.7 mmol/L (ref 3.5–5.1)
Sodium: 140 mmol/L (ref 135–145)

## 2017-10-03 LAB — CBC WITH DIFFERENTIAL/PLATELET
ABS IMMATURE GRANULOCYTES: 0.1 10*3/uL (ref 0.0–0.1)
BASOS PCT: 0 %
Basophils Absolute: 0.1 10*3/uL (ref 0.0–0.1)
Eosinophils Absolute: 0.1 10*3/uL (ref 0.0–0.7)
Eosinophils Relative: 0 %
HEMATOCRIT: 39 % (ref 39.0–52.0)
Hemoglobin: 13 g/dL (ref 13.0–17.0)
IMMATURE GRANULOCYTES: 1 %
LYMPHS ABS: 1.8 10*3/uL (ref 0.7–4.0)
Lymphocytes Relative: 12 %
MCH: 32.3 pg (ref 26.0–34.0)
MCHC: 33.3 g/dL (ref 30.0–36.0)
MCV: 97 fL (ref 78.0–100.0)
MONO ABS: 1.5 10*3/uL — AB (ref 0.1–1.0)
MONOS PCT: 10 %
NEUTROS ABS: 11.5 10*3/uL — AB (ref 1.7–7.7)
NEUTROS PCT: 77 %
PLATELETS: 242 10*3/uL (ref 150–400)
RBC: 4.02 MIL/uL — ABNORMAL LOW (ref 4.22–5.81)
RDW: 13.2 % (ref 11.5–15.5)
WBC: 14.9 10*3/uL — ABNORMAL HIGH (ref 4.0–10.5)

## 2017-10-03 LAB — BLOOD GAS, ARTERIAL
Acid-Base Excess: 2.6 mmol/L — ABNORMAL HIGH (ref 0.0–2.0)
BICARBONATE: 26.7 mmol/L (ref 20.0–28.0)
DRAWN BY: 44135
FIO2: 40
MECHVT: 580 mL
O2 SAT: 96.9 %
PATIENT TEMPERATURE: 99.8
PCO2 ART: 43.7 mmHg (ref 32.0–48.0)
PEEP/CPAP: 5 cmH2O
PH ART: 7.407 (ref 7.350–7.450)
PO2 ART: 99.4 mmHg (ref 83.0–108.0)
RATE: 20 resp/min

## 2017-10-03 LAB — GLUCOSE, CAPILLARY
GLUCOSE-CAPILLARY: 235 mg/dL — AB (ref 65–99)
GLUCOSE-CAPILLARY: 275 mg/dL — AB (ref 65–99)
GLUCOSE-CAPILLARY: 256 mg/dL — AB (ref 65–99)
GLUCOSE-CAPILLARY: 281 mg/dL — AB (ref 65–99)
GLUCOSE-CAPILLARY: 196 mg/dL — AB (ref 65–99)
GLUCOSE-CAPILLARY: 184 mg/dL — AB (ref 65–99)
Glucose-Capillary: 169 mg/dL — ABNORMAL HIGH (ref 65–99)

## 2017-10-03 MED ORDER — ORAL CARE MOUTH RINSE
15.0000 mL | OROMUCOSAL | Status: DC
  Administered 2017-10-03 – 2017-10-13 (×91): 15 mL via OROMUCOSAL

## 2017-10-03 MED ORDER — INSULIN GLARGINE 100 UNIT/ML ~~LOC~~ SOLN
12.0000 [IU] | Freq: Every day | SUBCUTANEOUS | Status: DC
  Administered 2017-10-03 – 2017-10-04 (×2): 12 [IU] via SUBCUTANEOUS
  Filled 2017-10-03 (×2): qty 0.12

## 2017-10-03 NOTE — Progress Notes (Addendum)
PULMONARY / CRITICAL CARE MEDICINE   Name: Dwayne [name] MRN: [id number] DOB: 08-22-1945    ADMISSION DATE:  09/29/2017 CONSULTATION DATE:  09/29/17  REFERRING MD:  [facility] Neurology.  CHIEF COMPLAINT:  Obtundation.   HISTORY OF PRESENT ILLNESS:   72 year old man with a history of COPD, coronary artery disease (on aspirin and Plavix), diabetes, hypertension, hyperlipidemia.  He developed acute inability to stand, right-sided weakness on 5/24, was found to be severely hypertensive to the 200s with a suppressed sensorium.  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  He was started on nicardipine for blood pressure management and admitted for further care.  PCCM consulted regarding his lethargy, altered mental status and likely impending respiratory failure.      SUBJECTIVE:  On PSV 40%, 5/5, tolerating Currently off sedation Purposeful movement to left side, slightly withdraws on right side  VITAL SIGNS: BP (!) 162/88   Pulse 73   Temp 99 F (37.2 C) (Axillary)   Resp (!) 22   Ht  (1.778 m)   Wt 264 lb 8.8 oz (120 kg)   SpO2 97%   BMI 37.96 kg/m   HEMODYNAMICS:    VENTILATOR SETTINGS: Vent Mode: PSV;CPAP FiO2 (%):  [40 %] 40 % Set Rate:  [20 bmp] 20 bmp Vt Set:  [580 mL] 580 mL PEEP:  [5 cmH20] 5 cmH20 Pressure Support:  [5 cmH20] 5 cmH20 Plateau Pressure:  [14 cmH20-16 cmH20] 16 cmH20  INTAKE / OUTPUT: I/O last 3 completed shifts: In: 2278.8 [I.V.:304.8; Other:10; WU/JW:1191; IV Piggyback:150] Out: 1325 [Urine:1325]  PHYSICAL EXAMINATION: General: Acutely ill-appearing make, intubated, on PSV, laying in bed, in no acute distress Neuro: Drowsy, arouses to voice, opens eyes, some purposeful movement of LUE.  Withdraws from pain on right side. Pupils PERRLA HEENT:  Atraumatic, normocephalic, no JVD, ETT in place  Cardiovascular: RRR, no M/G/G,  s1s2 noted but distant Lungs: Clear breath sounds throughout, symmetrical expansion, even, non-labored, no assessory muscle use Abdomen: BS+ x4, Obese, soft, non-tender Musculoskeletal:  No deformities Skin:  Warm, dry.  No obvious rashes, lesions, or ulcerations  LABS:  BMET Recent Labs  Lab 10/01/17 2007 10/02/17 0505 10/03/17 0421  NA 136 139 140  K 3.8 3.8 3.7  CL 100* 102 103  CO2 BUN 20 25* 33*  CREATININE 0.81 0.82 0.79  GLUCOSE 212* 201* 277*    Electrolytes Recent Labs  Lab 10/01/17 0214 10/01/17 1801 10/01/17 2007 10/02/17 0505 10/03/17 0421  CALCIUM 8.8*  --  8.9 9.2 9.3  MG 1.7 1.8  --  1.9  --   PHOS 2.6 2.2*  --  3.7  --     CBC Recent Labs  Lab 10/01/17 0214 10/02/17 0505 10/03/17 0421  WBC 14.6* 15.3* 14.9*  HGB 12.6* 13.2 13.0  HCT 37.3* 38.4* 39.0  PLT 235 250 242    Coag's Recent Labs  Lab 09/29/17 1053  APTT 27  INR 0.97    Sepsis Markers Recent Labs  Lab 09/29/17 1056 10/02/17 0721 10/03/17 0421  LATICACIDVEN 2.35*  --   --   PROCALCITON  --  0.13 <0.10    ABG Recent Labs  Lab 09/29/17 1542 10/03/17 0438  PHART 7.353 7.407  PCO2ART 49.3* 43.7  PO2ART 493.0* 99.4    Liver Enzymes Recent Labs  Lab 09/29/17 1053  AST 17  ALT 17  ALKPHOS 62  BILITOT 1.0  ALBUMIN 4.0    Cardiac Enzymes No results for input(s): TROPONINI, PROBNP in the last 168 hours.  Glucose Recent Labs  Lab 10/02/17 1237 10/02/17 1619 10/02/17 1950 10/02/17 2341 10/03/17 0316 10/03/17 0825  GLUCAP 257* 235* 250* 270* 275* 256*    Imaging Dg Chest Port 1 View  Result Date: 10/03/2017 CLINICAL DATA:  Leukocytosis. EXAM: PORTABLE CHEST 1 VIEW COMPARISON:  09/29/2017, 07/14/2016, 09/02/2015.  CT 11/24/2016. FINDINGS: Endotracheal tube, NG tube, left subclavian line in stable position. Stable cardiomegaly. Stable chronic interstitial changes with stable left base subsegmental atelectasis and/or scarring. No pleural effusion  or pneumothorax. IMPRESSION: 1.  Lines and tubes in stable position. 2.  Stable cardiomegaly.  No pulmonary venous congestion. 3. Stable chronic interstitial change with stable left base subsegmental atelectasis and/or scarring. Electronically Signed   By: Maisie Fus  Register   On: 10/03/2017 07:27    STUDIES:  N/A  CULTURES:  ANTIBIOTICS:  SIGNIFICANT EVENTS: Intubated 5/24 for airway protection.  LINES/TUBES: ETT 5/24 Left [place] TLC 5/24>>  DISCUSSION: Acute deep white matter hemorrhage consistent with a hypertensive bleed.  ICH score: 3 . Currently stable CT appearance with minimal associated edema. Longer term prognosis is guarded.   ASSESSMENT / PLAN:  PULMONARY A: Acute hypoxic respiratory failure seconardy to inability to protect airway.   P:   Full vent support, TV 8 cc/kg SBT as tolerated VAP bundle   CARDIOVASCULAR A:  HTN>> previously required Cardene gtt for BP control>>Currently blood pressure is well controlled without need for a Cardene infusion.    P:   ICU monitoring SBP goal <140 Continue Norvasc, coreg Nicardipine gtt PRN to maintain SBP <140   GASTROINTESTINAL A:   No acute issues P:   Continue tube feedings, titration per Dietician Pepcid for SUP May need PEG tube Speech evaluation once extubated  HEMATOLOGIC A:   No acute issues At high DVT risk DVT P:  SCD's for VTE prophylaxis for now; will be able to initiate chemical prophylaxis 7 days post bleed on 10/06/17 Monitor for s/sx of bleeding Follow CBC  INFECTIOUS A:   Mild reactive leukocytosis (no signs of infection) likely in setting of intracranial hemorrhage>>improving CXR 5/28 with stable Left base subsegmental atelectasis P:   Monitor fever curve Follow Procalcitonin>>negative Follow CBC's  ENDOCRINE A:   Hyperglycemia Hx: Type II DM on oral hypoglycemic agents at home P:   CBG's SSI Increase Lantus to 12 units daily on 5/28 Follow ICU Hyper/hypoglycemic  protocol  NEUROLOGIC A:   High-grade intracranial hemorrhage.  High risk of long-term disability.  At risk for worsening cerebral edema. P:   Neurology & Neurosurgery following, appreciate input RASS goal 0 to -1 Avoid sedation if able, minimal sedation Daily WUA CT Head 5/27 unchanged; F/u CT Head per Neurology/Neurosurgery If shows signs of increasing cerebral edema, consider Hypertonic saline   Have discussed with Dr. Wallace Cullens on 5/28, given pt more awake with some purposeful movements, and intermittently reaching for ETT, considering extubation trial.  However, Dr. Wallace Cullens has spoken with Neurology, and will hold off on extubation until pt's family has come to a consensus regarding goals of care if pt were to fail extubation.  Family Update: No family present at bedside 5/28.  Harlon Ditty, AGACNP-BC  Pulmonary & Critical Care Medicine Pager: [phone] After hours [phone]  10/03/2017, 9:43 AM

## 2017-10-03 NOTE — Progress Notes (Signed)
OT Cancellation Note  Patient Details Name: Dwayne [name] MRN: [id number] DOB: 1945-10-14   Cancelled Treatment:    Reason Eval/Treat Not Completed: Patient not medically ready.  Pt just extubated.  Will check back to determine appropriateness.  Vernell Back Jerome, OTR/L [phone]   Jeani Hawking M 10/03/2017, 3:22 PM

## 2017-10-03 NOTE — Progress Notes (Signed)
PT Cancellation Note  Patient Details Name: Carla Whilden MRN: [id number] DOB: December 16, 1945   Cancelled Treatment:    Reason Eval/Treat Not Completed: Patient not medically ready; patient being one-way extubated and not yet stable for mobility.  Will check back another day.   Elray Mcgregor 10/03/2017, 2:25 PM  Sheran Lawless, [place] [phone] 10/03/2017

## 2017-10-03 NOTE — Procedures (Signed)
Extubation Procedure Note  Patient Details:   Name: Dwayne [name] DOB: 1945-10-29 MRN: [id number]   Airway Documentation:    Vent end date: 10/03/17 Vent end time: 1440   Evaluation  O2 sats: stable throughout Complications: No apparent complications Patient did tolerate procedure well. Bilateral Breath Sounds: Clear, Diminished   No   [name].   Julieanne Manson 10/03/2017, 2:44 PM

## 2017-10-03 NOTE — Progress Notes (Addendum)
STROKE TEAM PROGRESS NOTE   SUBJECTIVE (INTERVAL HISTORY) No family present. Opens eyes but does not follow commands. Remains intubated. Plans for extubation today.   OBJECTIVE Temp:  [98.6 F (37 C)-99.8 F (37.7 C)] 99.8 F (37.7 C) (05/28 0400) Pulse Rate:  [56-77] 71 (05/28 0753) Cardiac Rhythm: Heart block (05/28 0400) Resp:  [16-22] 21 (05/28 0753) BP: (102-191)/(49-79) 132/69 (05/28 0753) SpO2:  [96 %-100 %] 100 % (05/28 0753) FiO2 (%):  [40 %] 40 % (05/28 0753) Weight:  [120 kg (264 lb 8.8 oz)] 120 kg (264 lb 8.8 oz) (05/28 0500)  CBC:  Recent Labs  Lab 10/02/17 0505 10/03/17 0421  WBC 15.3* 14.9*  NEUTROABS 11.0* 11.5*  HGB 13.2 13.0  HCT 38.4* 39.0  MCV 96.5 97.0  PLT 250 242    Basic Metabolic Panel:  Recent Labs  Lab 10/01/17 1801  10/02/17 0505 10/03/17 0421  NA  --    < > 139 140  K  --    < > 3.8 3.7  CL  --    < > 102 103  CO2  --    < > 27 29  GLUCOSE  --    < > 201* 277*  BUN  --    < > 25* 33*  CREATININE  --    < > 0.82 0.79  CALCIUM  --    < > 9.2 9.3  MG 1.8  --  1.9  --   PHOS 2.2*  --  3.7  --    < > = values in this interval not displayed.    Lipid Panel:     Component Value Date/Time   CHOL 118 10/01/2017 0214   TRIG 111 10/02/2017 0505   HDL 62 10/01/2017 0214   CHOLHDL 1.9 10/01/2017 0214   VLDL 19 10/01/2017 0214   LDLCALC 37 10/01/2017 0214   HgbA1c:  Lab Results  Component Value Date   HGBA1C 8.7 (H) 09/29/2017   Urine Drug Screen:     Component Value Date/Time   LABOPIA NONE DETECTED 10/01/2017 0715   COCAINSCRNUR NONE DETECTED 10/01/2017 0715   LABBENZ NONE DETECTED 10/01/2017 0715   AMPHETMU NONE DETECTED 10/01/2017 0715   THCU NONE DETECTED 10/01/2017 0715   LABBARB NONE DETECTED 10/01/2017 0715    Alcohol Level     Component Value Date/Time   ETH <10 09/29/2017 1053    IMAGING Ct Head Code Stroke Wo Contrast 09/29/2017 1. 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 Hemorrhage measures 2.5 x  4.2 x 2.0 cm (estimated volume is 88 cm^3).  2. Intraventricular extension of hemorrhage into the left lateral ventricle to the foramen of Monro. There is early dilation of the left lateral ventricle concerning for developing hydrocephalus.  3. 4 mm left right midline shift.   Ct Head Wo Contrast 09/29/2017  1. Increase size of hematoma within the left basal ganglia. Stable intraventricular hemorrhage with minimal redistribution to the occipital horn of right lateral ventricle.  2. Mild increase in local mass effect. Stable 4 mm left-to-right midline shift.   Ct Angio Head W Or Wo Contrast 09/29/2017 1. No acute or focal vascular lesion to explain the intracranial hemorrhage.  2. Atherosclerotic changes in the cavernous internal carotid arteries bilaterally with moderate stenoses relative to the more distal ICA termini, right greater than left.  3. Asymmetric narrowing of the distal left ICA lumen below the skull base suggesting a significant proximal stenosis.  4. No significant change in the parenchymal  hemorrhage or intraventricular component.   Ct Head Wo Contrast 09/30/2017 8:13 AM Essentially stable LEFT thalamic/basal ganglia hemorrhage with intraventricular extension. No increase in size, worsening shift, or hydrocephalus.   Ct Head Wo Contrast 10/02/2017 Unchanged examination of intraparenchymal hematoma centered in the left thalamus and basal ganglia with intraventricular extension.  Dg Chest Port 1 View  Result Date: 10/03/2017 CLINICAL DATA:  Leukocytosis. EXAM: PORTABLE CHEST 1 VIEW COMPARISON:  09/29/2017, 07/14/2016, 09/02/2015.  CT 11/24/2016. FINDINGS: Endotracheal tube, NG tube, left subclavian line in stable position. Stable cardiomegaly. Stable chronic interstitial changes with stable left base subsegmental atelectasis and/or scarring. No pleural effusion or pneumothorax. IMPRESSION: 1.  Lines and tubes in stable position. 2.  Stable cardiomegaly.  No pulmonary venous  congestion. 3. Stable chronic interstitial change with stable left base subsegmental atelectasis and/or scarring. Electronically Signed   By: Maisie Fus  Register   On: 10/03/2017 07:27    PHYSICAL EXAM per Dr. Pearlean Brownie Intubated . Afebrile. Head is nontraumatic. Neck is supple without bruit. Cardiac exam no murmur or gallop. Lungs are clear to auscultation. Distal pulses are well felt. Neurological Exam : Intubated. eyes open. Globally aphasic. Left gaze deviation. Unable to look to the right past midline. Does not blink to threat.  Right lower facial weakness possible but difficult to assess due to ET tube. Does not follow any commands. Tongue midline. Cough and gag positive. Motor system exam reveals spontaneous antigravity and purposeful left upper movement, left arm action tremor.  Right upper extremity flicker  Withdraws bilateral lower extremity to pain but right  less than the left side. Right plantar upgoing left upgoing. Brisk reflexes.   ASSESSMENT/PLAN Mr. [place] Dwayne [name] is a 72 y.o. male with history of coronary artery disease with previous MI, COPD with continued tobacco use, diabetes mellitus, hyperlipidemia, hypertension, depression, and anxiety presenting with onset of right-sided weakness followed by unresponsiveness.  He did not receive IV t-PA due to ICH.  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 Stable.   CT Head F/U 5/25  Essentially stable  Ct Head Wo Contrast - 10/02/2017 -- Unchanged  CTA Head - No acute or focal vascular lesion to explain the ICH. Bilateral carotid disease.  Carotid Doppler - will order once extubated  2D Echo - pending   LDL - 37  HgbA1c - 8.7  VTE prophylaxis - SCDs  aspirin 81 mg daily and clopidogrel 75 mg daily prior to admission, now on No antithrombotic due to bleed  Ongoing  aggressive stroke risk factor management  Therapy recommendations:  pending  Disposition:  Pending  Hypertensive Emergency  BP as high as 196/154 on arrival . Treated with IV Cardene, now off . SBP goal < 180 . BP now stable . Long-term BP goal normotensive  Hyperlipidemia  Lipid lowering medication PTA:  Lipitor 80 mg daily  LDL 37, goal < 70  resume lower dose of Lipitor at time of discharge  Diabetes  HgbA1c 8.7, goal < 7.0  Uncontrolled  Other Stroke Risk Factors  Advanced age  Cigarette smoker will be advised to stop smoking  ETOH use, advised to drink no more than 1 alcoholic beverage per day.  Obesity, Body mass index is 37.96 kg/m., recommend weight loss, diet and exercise as appropriate  Coronary artery disease  Other Active Problems  Bilateral carotid artery disease -  consider carotid Dopplers or CTA of neck.  Leukocytosis - 14.9 (temp 99.2) CXR stable  Morphine allergy  Anxiety and depression history - consider resuming Zoloft  NPO - On TF - Will likely need PEG tube.  Plan / Recommendations   Stroke workup: Acute left basal ganglia ICH w/ IVH secondary to hypertension.  Therapy Follow Up: pending  Disposition: pending  Antiplatelet / Anticoagulation: none  Statin: resume lower dose of Lipitor at time of discharge  MD Follow Up: [facility] in 4 weeks with Shanda Bumps  Other: pending  Further risk factor modification per primary care MD: Follow Up 2 weeks  Hospital day # 4  Annie Main, MSN, APRN, ANVP-BC, AGPCNP-BC Advanced Practice Stroke Nurse [facility] See Amion for Schedule & Pager information 10/03/2017 11:33 AM  I have personally examined this patient, reviewed notes, independently viewed imaging studies, participated in medical decision making and plan of care.ROS completed by me personally and pertinent positives fully documented  I have made any additions or clarifications directly to the  above note. Agree with note above.recommend change systolic blood pressure goal to below 180. I spoke to the patient's wife and 2 sons about his prognosis and likely need follow-up prolonged nursing care and rehabilitation and answered questions. Family is thinking about 1 day extubation versus prolonged aggressive care including reintubation, tracheostomy and PEG tube and will make a final decision soon. Hopefully extubate in the next 1-2 days. Discussed with Dr. Warren Lacy pulmonary critical care medicine.This patient is critically ill and at significant risk of neurological worsening, death and care requires constant monitoring of vital signs, hemodynamics,respiratory and cardiac monitoring, extensive review of multiple databases, frequent neurological assessment, discussion with family, other specialists and medical decision making of high complexity.I have made any additions or clarifications directly to the above note.This critical care time does not reflect procedure time, or teaching time or supervisory time of PA/NP/Med Resident etc but could involve care discussion time.  I spent 30 minutes of neurocritical care time  in the care of  this patient.      Delia Heady, MD Medical Director [facility] Pager: [phone] 10/03/2017 4:04 PM  To contact Stroke Continuity provider, please refer to [email or web address]. After hours, contact General Neurology

## 2017-10-04 ENCOUNTER — Inpatient Hospital Stay (HOSPITAL_COMMUNITY): Payer: Medicare Other

## 2017-10-04 LAB — CBC WITH DIFFERENTIAL/PLATELET
Abs Immature Granulocytes: 0.1 10*3/uL (ref 0.0–0.1)
BASOS ABS: 0.1 10*3/uL (ref 0.0–0.1)
Basophils Relative: 0 %
EOS PCT: 1 %
Eosinophils Absolute: 0.1 10*3/uL (ref 0.0–0.7)
HCT: 42.4 % (ref 39.0–52.0)
HEMOGLOBIN: 14.6 g/dL (ref 13.0–17.0)
IMMATURE GRANULOCYTES: 1 %
LYMPHS ABS: 2.1 10*3/uL (ref 0.7–4.0)
LYMPHS PCT: 13 %
MCH: 33.1 pg (ref 26.0–34.0)
MCHC: 34.4 g/dL (ref 30.0–36.0)
MCV: 96.1 fL (ref 78.0–100.0)
Monocytes Absolute: 1.5 10*3/uL — ABNORMAL HIGH (ref 0.1–1.0)
Monocytes Relative: 9 %
NEUTROS PCT: 76 %
Neutro Abs: 12 10*3/uL — ABNORMAL HIGH (ref 1.7–7.7)
Platelets: 258 10*3/uL (ref 150–400)
RBC: 4.41 MIL/uL (ref 4.22–5.81)
RDW: 13.4 % (ref 11.5–15.5)
WBC: 15.9 10*3/uL — AB (ref 4.0–10.5)

## 2017-10-04 LAB — BASIC METABOLIC PANEL
Anion gap: 10 (ref 5–15)
BUN: 27 mg/dL — AB (ref 6–20)
CO2: 27 mmol/L (ref 22–32)
CREATININE: 0.71 mg/dL (ref 0.61–1.24)
Calcium: 9.2 mg/dL (ref 8.9–10.3)
Chloride: 105 mmol/L (ref 101–111)
GFR calc non Af Amer: >60 mL/min (ref >60)
Glucose, Bld: 177 mg/dL — ABNORMAL HIGH (ref 65–99)
POTASSIUM: 3.8 mmol/L (ref 3.5–5.1)
Sodium: 142 mmol/L (ref 135–145)

## 2017-10-04 LAB — GLUCOSE, CAPILLARY
GLUCOSE-CAPILLARY: 218 mg/dL — AB (ref 65–99)
GLUCOSE-CAPILLARY: 268 mg/dL — AB (ref 65–99)
Glucose-Capillary: 183 mg/dL — ABNORMAL HIGH (ref 65–99)
Glucose-Capillary: 188 mg/dL — ABNORMAL HIGH (ref 65–99)
Glucose-Capillary: 184 mg/dL — ABNORMAL HIGH (ref 65–99)
Glucose-Capillary: 230 mg/dL — ABNORMAL HIGH (ref 65–99)

## 2017-10-04 LAB — PROCALCITONIN

## 2017-10-04 MED ORDER — JEVITY 1.2 CAL PO LIQD
1000.0000 mL | ORAL | Status: DC
  Administered 2017-10-04 – 2017-10-08 (×4): 1000 mL
  Filled 2017-10-04 (×10): qty 1000

## 2017-10-04 MED ORDER — ENALAPRILAT 1.25 MG/ML IV SOLN
0.6250 mg | Freq: Four times a day (QID) | INTRAVENOUS | Status: DC
  Administered 2017-10-04 – 2017-10-05 (×5): 0.625 mg via INTRAVENOUS
  Filled 2017-10-04 (×5): qty 1

## 2017-10-04 MED ORDER — PRO-STAT SUGAR FREE PO LIQD
60.0000 mL | Freq: Two times a day (BID) | ORAL | Status: DC
  Administered 2017-10-04 – 2017-10-13 (×17): 60 mL
  Filled 2017-10-04 (×17): qty 60

## 2017-10-04 MED ORDER — ENOXAPARIN SODIUM 40 MG/0.4ML ~~LOC~~ SOLN
40.0000 mg | SUBCUTANEOUS | Status: DC
  Administered 2017-10-04 – 2017-10-13 (×9): 40 mg via SUBCUTANEOUS
  Filled 2017-10-04 (×9): qty 0.4

## 2017-10-04 MED ORDER — HYDRALAZINE HCL 20 MG/ML IJ SOLN
5.0000 mg | INTRAMUSCULAR | Status: DC | PRN
  Administered 2017-10-04: 5 mg via INTRAVENOUS
  Filled 2017-10-04: qty 1

## 2017-10-04 NOTE — Progress Notes (Signed)
Nutrition Follow-up  DOCUMENTATION CODES:   Morbid obesity  INTERVENTION:   Once Cortrak placed Jevity 1.2 @ 65 ml/hr  60 ml Prostat BID  Provides: 2272 kcal, 146 grams protein, and 1265 ml free water    NUTRITION DIAGNOSIS:   Inadequate oral intake related to inability to eat as evidenced by NPO status. Ongoing.  GOAL:   Patient will meet greater than or equal to 90% of their needs Progressing.   MONITOR:   TF tolerance, Labs  ASSESSMENT:   Pt with PMH of COPD, CAD, DM, HTN, and HLD admitted 5/24 with large L thalamic bleed.  Pt discussed during ICU rounds and with RN.  Spoke with MD and family. Family ok with short term feedings with Cortrak placement but does not want a PEG.   Weight has decreased from 274 lb to 252 lb Per chart review it appears that usual body weight is 270's  Medications reviewed and include: lantus, senokot-s CBG (last 3)  Recent Labs    10/04/17 0338 10/04/17 0732 10/04/17 1103  GLUCAP 183* 188* 184*     Diet Order:   Diet Order           Diet NPO time specified  Diet effective now          EDUCATION NEEDS:   No education needs have been identified at this time  Skin:  Skin Assessment: (MASD: groin and abdomen)  Last BM:  5/28  Height:   Ht Readings from Last 1 Encounters:  09/29/17  (1.778 m)    Weight:   Wt Readings from Last 1 Encounters:  10/04/17 252 lb 6.8 oz (114.5 kg)    Ideal Body Weight:  75.45 kg  BMI:  Body mass index is 36.22 kg/m.  Estimated Nutritional Needs:   Kcal:  2200-2400  Protein:  130-150 grams  Fluid:  >2 L/day  Kendell Bane RD, LDN, CNSC [phone] Pager [phone] After Hours Pager

## 2017-10-04 NOTE — Evaluation (Signed)
Clinical/Bedside Swallow Evaluation Patient Details  Name: Dwayne [name] MRN: [id number] Date of Birth: 10-26-1945  Today's Date: 10/04/2017 Time: SLP Start Time (ACUTE ONLY): 1330 SLP Stop Time (ACUTE ONLY): 1350 SLP Time Calculation (min) (ACUTE ONLY): 20 min  Past Medical History:  Past Medical History:  Diagnosis Date  . Anxiety   . Aortic atherosclerosis (HCC) 08/14/2017  . Arthritis    "knees, elbows, hands" (02/12/2014)  . Compression fracture of L2 (HCC) 03/08/2013  . COPD (chronic obstructive pulmonary disease) (HCC)   . Coronary artery disease    Dr. Delton See, [facility]- follows  . Depression   . Esophageal reflux   . Gallstones   . Hyperlipidemia   . Hypertension, benign    Benign  . 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 Myocardial infarction (HCC) 1998 X 2   "probably a couple since 1998" (02/12/2014)  . Palpitations   . Pneumonia ~ 2004 X 1  . Swallowing difficulty    intermittent-occ"doesn't matter with solid or liquid"   Past Surgical History:  Past Surgical History:  Procedure Laterality Date  . CARDIAC CATHETERIZATION  ~ 2012  . CATARACT EXTRACTION W/ INTRAOCULAR LENS IMPLANT Right 10/2013  . CHOLECYSTECTOMY N/A 03/07/2016   Procedure: LAPAROSCOPIC CHOLECYSTECTOMY WITH INTRAOPERATIVE CHOLANGIOGRAM;  Surgeon: Glenna Fellows, MD;  Location: [facility] ORS;  Service: General;  Laterality: N/A;  . CORONARY ANGIOPLASTY  1998  . CORONARY ANGIOPLASTY WITH STENT PLACEMENT  1999; 2014; 02/12/2014   "1; 2; 1"  . LEFT HEART CATHETERIZATION WITH CORONARY ANGIOGRAM N/A 02/12/2014   Procedure: LEFT HEART CATHETERIZATION WITH CORONARY ANGIOGRAM;  Surgeon: Lesleigh Noe, MD;  Location: [facility] CATH LAB;  Service: Cardiovascular;  Laterality: N/A;   HPI:  72 year old man with a history of COPD, coronary artery disease (on aspirin and Plavix), diabetes, hypertension, hyperlipidemia.  He developed acute inability to stand, right-sided  weakness on 5/24, was found to be severely hypertensive to the 200s with a suppressed sensorium.  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 Intubated on 5/24, one way extubation on 5/28.    Assessment / Plan / Recommendation Clinical Impression  Pt demonstrates severely impaired cognition, language and arousal making him poorly responsive to PO trials. With max hand over hand assist pt still with only 1-2 moments of focused attention to PO with minimal oral manipulation. Ice and nectar removed from oral cavity via suction. Pt not capable of PO intake at this time.  SLP Visit Diagnosis: Dysphagia, oropharyngeal phase (R13.12)    Aspiration Risk  Severe aspiration risk    Diet Recommendation NPO;Alternative means - temporary   Medication Administration: Via alternative means    Other  Recommendations Oral Care Recommendations: Oral care BID   Follow up Recommendations Skilled Nursing facility      Frequency and Duration min 2x/week  2 weeks       Prognosis Prognosis for Safe Diet Advancement: Fair Barriers to Reach Goals: Severity of deficits      Swallow Study   General HPI: 72 year old man with a history of COPD, coronary artery disease (on aspirin and Plavix), diabetes, hypertension, hyperlipidemia.  He developed acute inability to stand, right-sided weakness on 5/24, was found to be severely hypertensive to the 200s with a suppressed sensorium.  In the emergency department he was found to have a large left basal ganglier thalamic intracerebral hemorrhage  with some intraventricular extension (ICH score 4). Intubated on 5/24, one way extubation on 5/28.  Type of Study: Bedside Swallow Evaluation Previous Swallow Assessment: none Diet Prior to this Study: NPO Temperature Spikes Noted: No History of Recent Intubation: Yes Length of Intubations (days): 5 days Date  extubated: 10/03/17 Behavior/Cognition: Lethargic/Drowsy;Requires cueing;Distractible Oral Care Completed by SLP: No Oral Cavity - Dentition: Edentulous Self-Feeding Abilities: Total assist Patient Positioning: Upright in bed Baseline Vocal Quality: Not observed Volitional Cough: Cognitively unable to elicit Volitional Swallow: Unable to elicit    Oral/Motor/Sensory Function Overall Oral Motor/Sensory Function: Moderate impairment(does not follow commands) Facial ROM: Reduced right;Suspected CN VII (facial) dysfunction Facial Symmetry: Abnormal symmetry right;Suspected CN VII (facial) dysfunction   Ice Chips     Thin Liquid Thin Liquid: Impaired Presentation: Cup Oral Phase Impairments: Poor awareness of bolus    Nectar Thick Nectar Thick Liquid: Impaired Presentation: Spoon Oral Phase Impairments: Poor awareness of bolus;Reduced labial seal;Reduced lingual movement/coordination Oral phase functional implications: Oral holding;Prolonged oral transit   Honey Thick     Puree Puree: Not tested   Solid   GO   Solid: Not tested        Dwayne [name], Dwayne [name] 10/04/2017,2:39 PM

## 2017-10-04 NOTE — Care Management Note (Signed)
Case Management Note  Patient Details  Name: Nazim Graffius MRN: [id number] Date of Birth: 12/27/1945  Subjective/Objective:  Pt admitted on 09/29/17 with Lt basal ganglia ICH.  PTA, pt independent, lives with spouse and daughter.                  Action/Plan: Met with pt's wife and daughter to discuss dc planning.  They are [facility].  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  He would not be eligible for a [facility] hospital transfer, as he has never accessed his VA benefits.  Explained this to family, and they verbalize understanding.   Wife states that she works part time, and daughter unlikely able to provide 24h care at dc for pt.  We discussed CIR vs SNF rehab, and wife states they are considering SNF, as CIR may not be long enough.  Will ask CSW to follow for possible SNF upon medical stability.   Expected Discharge Date:                  Expected Discharge Plan:     In-House Referral:     Discharge planning Services     Post Acute Care Choice:    Choice offered to:     DME Arranged:    DME Agency:     HH Arranged:    HH Agency:     Status of Service:     If discussed at Long Length of Stay Meetings, dates discussed:    Additional Comments:  Julie W. Amerson, RN, BSN  Trauma/Neuro ICU Case Manager [phone] 

## 2017-10-04 NOTE — Progress Notes (Signed)
PULMONARY / CRITICAL CARE MEDICINE   Name: Dwayne [name] MRN: [id number] DOB: 1945/10/14    ADMISSION DATE:  09/29/2017 CONSULTATION DATE:  09/29/17  REFERRING MD:  [facility].  CHIEF COMPLAINT:  Obtundation.   HISTORY OF PRESENT ILLNESS:   72 year old man with a history of COPD, coronary artery disease (on aspirin and Plavix), diabetes, hypertension, hyperlipidemia.  He developed acute inability to stand, right-sided weakness on 5/24, was found to be severely hypertensive to the 200s with a suppressed sensorium.  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  He was started on nicardipine for blood pressure management and admitted for further care.   After discussion with family yesterday the patient was extubated with no plan to reintubate should he develop respiratory distress.  He is not at all interactive following extubation.      SUBJECTIVE:  On PSV 40%, 5/5, tolerating Currently off sedation Purposeful movement to left side, slightly withdraws on right side  VITAL SIGNS: BP (!) 176/75   Pulse 78   Temp 99 F (37.2 C) (Axillary)   Resp (!) 21   Ht  (1.778 m)   Wt 252 lb 6.8 oz (114.5 kg)   SpO2 95%   BMI 36.22 kg/m   HEMODYNAMICS:    VENTILATOR SETTINGS: Vent Mode: PSV;CPAP FiO2 (%):  [40 %] 40 % Set Rate:  [20 bmp] 20 bmp Vt Set:  [580 mL] 580 mL PEEP:  [5 cmH20] 5 cmH20 Pressure Support:  [5 cmH20] 5 cmH20 Plateau Pressure:  [16 cmH20] 16 cmH20  INTAKE / OUTPUT: I/O last 3 completed shifts: In: 1250.7 [I.V.:50.7; NG/GT:1050; IV Piggyback:150] Out: 2150 [Urine:2150]  PHYSICAL EXAMINATION: General: Some gurgling respirations but not distressed. Neuro: No response to voice.  Some spontaneous purposeful movement of the left upper extremity.  Partial eye opening to sternal rub.  No interaction.  Pupils equal. HEENT:  Atraumatic,  normocephalic, no JVD, ETT in place  Cardiovascular: S1 and S2 are somewhat distant and regular without murmur rub or gallop  Lungs: There is some gurgling in the upper airway, he has an intermittent strong cough.  There is symmetric air movement a few scattered rhonchi and no wheezes.  Respirations are unlabored. Abdomen: The abdomen is soft without any organomegaly masses tenderness guarding or rebound Musculoskeletal:  No deformities Skin:  Warm, dry.  No obvious rashes, lesions, or ulcerations  LABS:  BMET Recent Labs  Lab 10/02/17 0505 10/03/17 0421 10/04/17 0241  NA 139 140 142  K 3.8 3.7 3.8  CL 102 103 105  CO2 BUN 25* 33* 27*  CREATININE 0.82 0.79 0.71  GLUCOSE 201* 277* 177*    Electrolytes Recent Labs  Lab 10/01/17 0214 10/01/17 1801  10/02/17 0505 10/03/17 0421 10/04/17 0241  CALCIUM 8.8*  --    < > 9.2 9.3 9.2  MG 1.7 1.8  --  1.9  --   --   PHOS 2.6 2.2*  --  3.7  --   --    < > = values in this interval not displayed.    CBC Recent Labs  Lab 10/02/17 0505 10/03/17 0421 10/04/17 0241  WBC 15.3* 14.9* 15.9*  HGB 13.2 13.0 14.6  HCT 38.4* 39.0 42.4  PLT 250 242 258    Coag's Recent Labs  Lab 09/29/17 1053  APTT 27  INR 0.97    Sepsis Markers Recent  Labs  Lab 09/29/17 1056 10/02/17 0721 10/03/17 0421 10/04/17 0241  LATICACIDVEN 2.35*  --   --   --   PROCALCITON  --  0.13 <0.10 <0.10    ABG Recent Labs  Lab 09/29/17 1542 10/03/17 0438  PHART 7.353 7.407  PCO2ART 49.3* 43.7  PO2ART 493.0* 99.4    Liver Enzymes Recent Labs  Lab 09/29/17 1053  AST 17  ALT 17  ALKPHOS 62  BILITOT 1.0  ALBUMIN 4.0    Cardiac Enzymes No results for input(s): TROPONINI, PROBNP in the last 168 hours.  Glucose Recent Labs  Lab 10/03/17 1200 10/03/17 1534 10/03/17 1931 10/03/17 2326 10/04/17 0338 10/04/17 0732  GLUCAP 281* 196* 169* 184* 183* 188*    Imaging No results found.  STUDIES:   N/A  CULTURES:  ANTIBIOTICS:  SIGNIFICANT EVENTS: Intubated 5/24 for airway protection.  LINES/TUBES: ETT 5/24 Left [place] TLC 5/24>>  DISCUSSION: Acute deep white matter hemorrhage consistent with a hypertensive bleed.  ICH score: 3 .  Family has elected not to reintubate should he develop respiratory difficulties  ASSESSMENT / PLAN:  PULMONARY A: No overt aspiration at this point however he seems to have very poor control of his upper airway secretions and I am anticipating the evolution of respiratory difficulties.  Per my discussion with family yesterday we will not reintubate.    CARDIOVASCULAR A:  HTN>> previously required Cardene gtt for BP control>>Currently blood pressure is well controlled without need for a Cardene infusion.    P: We have lost our route for enteral administration medications, I have ordered a dose of parenteral ACE inhibitor for blood pressure control  GASTROINTESTINAL A:   No acute issues P:   I will need to discuss with family whether they wish some kind of ongoing nutritional support based on yesterday's discussions I think they will not and will opt for comfort care  Pepcid for SUP  HEMATOLOGIC A:   No acute issues At high DVT risk DVT P:  SCD's for VTE prophylaxis for now; will be able to initiate chemical prophylaxis 7 days post bleed on 10/06/17 Monitor for s/sx of bleeding Follow CBC  INFECTIOUS A:   Mild reactive leukocytosis (no signs of infection) likely in setting of intracranial hemorrhage>>improving CXR 5/28 with stable Left base subsegmental atelectasis P:   There is still no overt provocation for his white count other than the intracranial hemorrhage.  His procalcitonin today is less than 0.10 and his chest x-ray shows chronic interstitial changes but no acute infiltrate   ENDOCRINE A:   Hyperglycemia Hx: Type II DM on oral hypoglycemic agents at home P:   CBG's SSI He still a little hyperglycemic even with his tube  feeding discontinued.  I will adjust his sliding scale if this persists through the day.  NEUROLOGIC A:   High-grade intracranial hemorrhage.  High risk of long-term disability.  At risk for worsening cerebral edema. P: This is day 5 following hypertensive left basal ganglia hemorrhage Neurology & Neurosurgery following, appreciate input And will need to discuss with family today whether or not they would like to have a feeding tube placed for administration of medications, my gestalt from yesterday's discussions is that they will not.  In the interim I am converting all of his medications to parenteral including antihypertensives.     Penny Pia, MD Critical Care Medicine Pager: [phone] After hours [phone]  10/04/2017, 7:49 AM

## 2017-10-04 NOTE — Progress Notes (Addendum)
STROKE TEAM PROGRESS NOTE   SUBJECTIVE (INTERVAL HISTORY) No family present.  Patient alert, lying in bed.  Now extubated.  Family has decided they do not want him reintubated.  OBJECTIVE Temp:  [97.9 F (36.6 C)-99.2 F (37.3 C)] 99 F (37.2 C) (05/29 0739) Pulse Rate:  [64-78] 73 (05/29 0800) Cardiac Rhythm: Heart block (05/29 0800) Resp:  [18-27] 19 (05/29 0800) BP: (116-185)/(56-93) 156/77 (05/29 0800) SpO2:  [90 %-100 %] 96 % (05/29 0800) FiO2 (%):  [40 %] 40 % (05/28 1200) Weight:  [114.5 kg (252 lb 6.8 oz)] 114.5 kg (252 lb 6.8 oz) (05/29 0500)  CBC:  Recent Labs  Lab 10/03/17 0421 10/04/17 0241  WBC 14.9* 15.9*  NEUTROABS 11.5* 12.0*  HGB 13.0 14.6  HCT 39.0 42.4  MCV 97.0 96.1  PLT 242 258    Basic Metabolic Panel:  Recent Labs  Lab 10/01/17 1801  10/02/17 0505 10/03/17 0421 10/04/17 0241  NA  --    < > 139 140 142  K  --    < > 3.8 3.7 3.8  CL  --    < > 102 103 105  CO2  --    < > GLUCOSE  --    < > 201* 277* 177*  BUN  --    < > 25* 33* 27*  CREATININE  --    < > 0.82 0.79 0.71  CALCIUM  --    < > 9.2 9.3 9.2  MG 1.8  --  1.9  --   --   PHOS 2.2*  --  3.7  --   --    < > = values in this interval not displayed.    PHYSICAL EXAM    . Afebrile. Head is nontraumatic. Neck is supple without bruit.Cardiac exam no murmur or gallop. Lungs are clear to auscultation. Distal pulses are well felt. Neurological Exam :  . Awake. Globally aphasic. Left gaze deviation. Unable to look to the right past midline. Does not blink to threat.  Right lower facial weakness. Does not follow any commands. Tongue midline. Cough and gag positive. Motor system exam reveals spontaneous antigravity and purposeful left upper movement, left arm action tremor.  Right upper extremity flicker  Withdraws bilateral lower extremity to pain but right  less than the left side. Right plantar upgoing left upgoing. Brisk reflexes.   ASSESSMENT/PLAN Mr. Hager Compston is a 72 y.o.  male with history of coronary artery disease with previous MI, COPD with continued tobacco use, diabetes mellitus, hyperlipidemia, hypertension, depression, and anxiety presenting with onset of right-sided weakness followed by unresponsiveness.  He did not receive IV t-PA due to ICH.  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 Stable.   CT Head F/U 5/25  Essentially stable  Ct Head Wo Contrast - 10/02/2017 -- Unchanged  CTA Head - No acute or focal vascular lesion to explain the ICH. Bilateral carotid disease.  Carotid Doppler - pending   2D Echo - pending   LDL - 37  HgbA1c - 8.7  VTE prophylaxis - change to Lovenox 40 mg sq daily   Check swallow  aspirin 81 mg daily and clopidogrel 75 mg daily prior to admission, now on No antithrombotic due to bleed  Ongoing aggressive stroke risk factor management  Therapy recommendations:  CIR  Disposition:  Pending. Family considering comfort care per Dr. Wallace Cullens  Respiratory distress d/t hemorrhage  Extubated 528 with plans for no reintubation   hypertensive Emergency  BP as high as 196/154 on arrival . Treated with IV Cardene, now off . SBP goal < 180 . BP now stable . Long-term BP goal normotensive  Hyperlipidemia  Lipid lowering medication PTA:  Lipitor 80 mg daily  LDL 37, goal < 70  resume lower dose of Lipitor at time of discharge  Diabetes  HgbA1c 8.7, goal < 7.0  Uncontrolled  Other Stroke Risk Factors  Advanced age  Cigarette smoker will be advised to stop smoking  ETOH use, advised to drink no more than 1 alcoholic beverage per day.  Obesity, Body mass index is 36.22 kg/m., recommend weight loss, diet and exercise as appropriate  Coronary artery disease  Other Active Problems  Bilateral carotid artery disease - check carotid Dopplers  pending   Leukocytosis - 15.9 (temp 99.2) CXR stable.  Thought to be reactive in setting of hemorrhage.  Morphine allergy  Anxiety and depression history - consider resuming Zoloft  NPO - On TF while intubated - Will likely need PEG tube. SLP to assess post extubation  Plan / Recommendations   Stroke workup: Acute left basal ganglia ICH w/ IVH secondary to hypertension.  Therapy Follow Up: CIR  Disposition: pending,  ? Comfort care. Transfer to floor  Antiplatelet / Anticoagulation: none  Statin: resume lower dose of Lipitor at time of discharge  MD Follow Up: [facility] in 4 weeks with Shanda Bumps  Other: Check swallow  Further risk factor modification per primary care MD: Follow Up 2 weeks  Hospital day # 5  Annie Main, MSN, APRN, ANVP-BC, AGPCNP-BC Advanced Practice Stroke Nurse [facility] See Amion for Schedule & Pager information 10/04/2017 8:57 AM    Agree with note above. Family has made decision about DO NOT RESUSCITATE. And considering possible comfort care but have not made that decision yet.Discussed with Dr. Warren Lacy pulmonary critical care medicine.This patient is critically ill and at significant risk of neurological worsening, death and care requires constant monitoring of vital signs, hemodynamics,respiratory and cardiac monitoring, extensive review of multiple databases, frequent neurological assessment, discussion with family, other specialists and medical decision making of high complexity.I have made any additions or clarifications directly to the above note.This critical care time does not reflect procedure time, or teaching time or supervisory time of PA/NP/Med Resident etc but could involve care discussion time.  I spent 30 minutes of neurocritical care time  in the care of  this patient.      Delia Heady, MD Medical Director [facility] Pager: [phone] 10/04/2017 8:57 AM  To contact Stroke  Continuity provider, please refer to [email or web address]. After hours, contact General Neurology

## 2017-10-04 NOTE — Progress Notes (Signed)
Rehab Admissions Coordinator Note:  Patient was screened by Clois Dupes for appropriateness for an Inpatient Acute Rehab Consult per OT recommendation. Noted per Critical Care MD that ongoing discussions concerning comfort care. I will follow his progress to assist with determining dispo as appropriate. I will not recommend CIR consult at this time due to the ongoing comfort care discussions.Clois Dupes 10/04/2017, 10:39 AM  I can be reached at [phone]

## 2017-10-04 NOTE — Evaluation (Signed)
Physical Therapy Evaluation Patient Details Name: Dwayne [name] MRN: [id number] DOB: [birth year] Today's Date: 10/04/2017   History of Present Illness  59 male s/p ICH L basal ganglia PMH: COPD, MI, CAD, HF  Clinical Impression  Pt admitted with above. Pt with global aphasia, R sided hemiplegia, and R sided neglect with noted visual deficits. Recommending CIR however aware family is planning to meet with the palliative care team and the wife would like to look into what services they can receive through the [place]. Acute PT to con't to follow.    Follow Up Recommendations CIR;Supervision/Assistance - 24 hour    Equipment Recommendations  None recommended by PT(TBD by next venue)    Recommendations for Other Services Rehab consult     Precautions / Restrictions Precautions Precautions: Fall Precaution Comments: VSS stable  Restrictions Weight Bearing Restrictions: No      Mobility  Bed Mobility Overal bed mobility: Needs Assistance Bed Mobility: Supine to Sit;Sit to Supine     Supine to sit: Total assist;+2 for physical assistance Sit to supine: Total assist;+2 for physical assistance;+2 for safety/equipment   General bed mobility comments: pt total +3 (A) for return to supine at this time total (A). pt with no activation. pt returning to supine R Side lying and then total (A) to roll onto back  Transfers Overall transfer level: Needs assistance Equipment used: (2 person lift with gait belt and bed pad) Transfers: Sit to/from Stand Sit to Stand: Total assist;+2 physical assistance;+2 safety/equipment;From elevated surface         General transfer comment: total+3 used to transfer from bed surface total (A) with bed elevation and pad used. bil LE required blocking   Ambulation/Gait             General Gait Details: unable  Stairs            Wheelchair Mobility    Modified Rankin (Stroke Patients Only)       Balance Overall balance assessment: Needs  assistance Sitting-balance support: Single extremity supported;Feet supported Sitting balance-Leahy Scale: Zero Sitting balance - Comments: pt pushing with L UE toward R. decr awareness to R sided weakness.                                      Pertinent Vitals/Pain Pain Assessment: Faces Faces Pain Scale: Hurts a little bit Pain Location: t/o body with movement, wife reports he broke his back 2 years ago and has some discomfort    Home Living Family/patient expects to be discharged to:: Inpatient rehab Living Arrangements: Spouse/significant other Available Help at Discharge: Family;Available PRN/intermittently;Available 24 hours/day Type of Home: House Home Access: Stairs to enter         Additional Comments: wife reports that daughter is within the home to help     Prior Function Level of Independence: Independent with assistive device(s)         Comments: was using cane and walker but was able to complete all ADLs and mobility modified indep     Hand Dominance   Dominant Hand: Right    Extremity/Trunk Assessment   Upper Extremity Assessment Upper Extremity Assessment: Defer to OT evaluation RUE Deficits / Details: brunstrom Flaccid    Lower Extremity Assessment Lower Extremity Assessment: RLE deficits/detail;LLE deficits/detail RLE Deficits / Details: increased tone with movement, but no active initiation, does withdrawl minimally to pain LLE Deficits / Details: moves spontaneously but  not to command    Cervical / Trunk Assessment Cervical / Trunk Assessment: Kyphotic  Communication   Communication: Receptive difficulties;Expressive difficulties  Cognition Arousal/Alertness: Lethargic Behavior During Therapy: Flat affect Overall Cognitive Status: Difficult to assess Area of Impairment: Attention;Following commands;Problem solving                   Current Attention Level: Focused   Following Commands: Follows one step commands  inconsistently     Problem Solving: Slow processing;Decreased initiation;Difficulty sequencing;Requires verbal cues;Requires tactile cues(pt demo'd withdrawl to pain on R UE and LE ) General Comments: pt with minimal command follow on the L side no command follow on the R. Pt unable to pass midline when tracking finger      General Comments General comments (skin integrity, edema, etc.): VSS throughtout    Exercises     Assessment/Plan    PT Assessment Patient needs continued PT services  PT Problem List Decreased strength;Decreased range of motion;Decreased activity tolerance;Decreased balance;Decreased mobility;Decreased coordination;Decreased cognition;Decreased knowledge of use of DME;Decreased safety awareness;Decreased knowledge of precautions;Cardiopulmonary status limiting activity;Impaired sensation;Impaired tone       PT Treatment Interventions DME instruction;Gait training;Stair training;Functional mobility training;Therapeutic activities;Therapeutic exercise;Balance training;Neuromuscular re-education;Cognitive remediation;Patient/family education    PT Goals (Current goals can be found in the Care Plan section)  Acute Rehab PT Goals Patient Stated Goal: none stated by patient and wife wanting to speak with someone regarding VA benefits PT Goal Formulation: With family Time For Goal Achievement: 10/11/17 Potential to Achieve Goals: Fair    Frequency Min 4X/week   Barriers to discharge        Co-evaluation PT/OT/SLP Co-Evaluation/Treatment: Yes Reason for Co-Treatment: Complexity of the patient's impairments (multi-system involvement) PT goals addressed during session: Mobility/safety with mobility OT goals addressed during session: ADL's and self-care;Proper use of Adaptive equipment and DME;Strengthening/ROM       AM-PAC PT "6 Clicks" Daily Activity  Outcome Measure Difficulty turning over in bed (including adjusting bedclothes, sheets and blankets)?:  Unable Difficulty moving from lying on back to sitting on the side of the bed? : Unable Difficulty sitting down on and standing up from a chair with arms (e.g., wheelchair, bedside commode, etc,.)?: Unable Help needed moving to and from a bed to chair (including a wheelchair)?: Total Help needed walking in hospital room?: Total Help needed climbing 3-5 steps with a railing? : Total 6 Click Score: 6    End of Session Equipment Utilized During Treatment: Gait belt;Oxygen Activity Tolerance: Patient tolerated treatment well Patient left: in bed;with call bell/phone within reach;with bed alarm set;with family/visitor present Nurse Communication: Mobility status PT Visit Diagnosis: Unsteadiness on feet (R26.81);Hemiplegia and hemiparesis Hemiplegia - Right/Left: Right Hemiplegia - dominant/non-dominant: Dominant    Time: 1610-9604 PT Time Calculation (min) (ACUTE ONLY): 28 min   Charges:   PT Evaluation $PT Eval High Complexity: 1 High     PT G Codes:        Lewis Shock, PT, DPT Pager #: [phone] Office #: [phone]   Lillah Standre M Shoua Ressler 10/04/2017, 12:46 PM

## 2017-10-04 NOTE — Evaluation (Addendum)
Occupational Therapy Evaluation Patient Details Name: Dwayne [name] MRN: [id number] DOB: 12/16/1945 Today's Date: 10/04/2017    History of Present Illness 1 male s/p ICH L basal ganglia PMH: COPD, MI, CAD, HF   Clinical Impression   PT admitted with ICH L basal ganglia. Pt currently with functional limitiations due to the deficits listed below (see OT problem list). PT currently total +3 total (A) for basic transfer and bed mobility. Pt pushing with L UE in static sitting and decr R attention with L gaze preference. Pt not following commands at this time. Recommend R UE resting hand splint from ortho tech to be wear at night only.  Pt will benefit from skilled OT to increase their independence and safety with adls and balance to allow discharge CIR pending progress.     Follow Up Recommendations  CIR    Equipment Recommendations  3 in 1 bedside commode;Wheelchair (measurements OT);Wheelchair cushion (measurements OT);Hospital bed(hoyer lift)    Recommendations for Other Services Rehab consult     Precautions / Restrictions Precautions Precautions: Fall Precaution Comments: VSS stable  Restrictions Weight Bearing Restrictions: No      Mobility Bed Mobility Overal bed mobility: Needs Assistance Bed Mobility: Supine to Sit;Sit to Supine       Sit to supine: Total assist;+2 for physical assistance;+2 for safety/equipment   General bed mobility comments: pt total +3 (A) for return to supine at this time total (A). pt with no activation. pt returning to supine R Side lying and then total (A) to roll onto back  Transfers Overall transfer level: Needs assistance   Transfers: Sit to/from Stand Sit to Stand: Total assist;+2 physical assistance;+2 safety/equipment;From elevated surface         General transfer comment: total+3 used to transfer from bed surface total (A) with bed elevation and pad used. bil LE required blocking     Balance Overall balance assessment: Needs  assistance Sitting-balance support: Single extremity supported;Feet supported Sitting balance-Leahy Scale: Zero Sitting balance - Comments: pt pushing with L UE toward R. decr awareness to R sided weakness.                                    ADL either performed or assessed with clinical judgement   ADL Overall ADL's : Needs assistance/impaired Eating/Feeding: NPO   Grooming: Maximal assistance;Sitting Grooming Details (indicate cue type and reason): hand over hand with LOB with L UE used with posterior lean. Pt total (A) to reposition to sitting Upper Body Bathing: Total assistance   Lower Body Bathing: Total assistance   Upper Body Dressing : Total assistance   Lower Body Dressing: Total assistance     Toilet Transfer Details (indicate cue type and reason): na           General ADL Comments: Pt completed sit<>Stand x3 during session but unable to sustain. pt with total +3 (A) to static stand with pad used and BIL LE blocked     Vision Patient Visual Report: Other (comment)(wife reports previous eye surg) Vision Assessment?: Vision impaired- to be further tested in functional context Eye Alignment: Impaired (comment) Ocular Range of Motion: Impaired-to be further tested in functional context Alignment/Gaze Preference: Head turned;Head tilt;Gaze left Tracking/Visual Pursuits: Unable to hold eye position out of midline;Impaired - to be further tested in functional context Additional Comments: pt able to track to midline during session with max cueing and L UE held to  help wiht trunk rotation toward midline. pt unable to sustain visual attention     Perception Perception Perception Tested?: Yes Perception Deficits: Inattention/neglect Inattention/Neglect: Does not attend to right side of body;Does not attend to right visual field   Praxis      Pertinent Vitals/Pain Pain Assessment: Faces Faces Pain Scale: Hurts a little bit Pain Location: t/o body with  movement, wife reports he broke his back 2 years ago and has some discomfort     Hand Dominance Right   Extremity/Trunk Assessment Upper Extremity Assessment Upper Extremity Assessment: RUE deficits/detail RUE Deficits / Details: brunstrom Flaccid   Lower Extremity Assessment Lower Extremity Assessment: Defer to PT evaluation   Cervical / Trunk Assessment Cervical / Trunk Assessment: Kyphotic   Communication Communication Communication: Receptive difficulties;Expressive difficulties   Cognition Arousal/Alertness: Lethargic Behavior During Therapy: Flat affect Overall Cognitive Status: Impaired/Different from baseline                                     General Comments  VSS throughout session . pt with RR 20 and O2 noted to decr with activity to 87% on [place]    Exercises     Shoulder Instructions      Home Living Family/patient expects to be discharged to:: Inpatient rehab Living Arrangements: Spouse/significant other Available Help at Discharge: Family;Available PRN/intermittently;Available 24 hours/day Type of Home: House Home Access: Stairs to enter                         Additional Comments: wife reports that daughter is within the home to help       Prior Functioning/Environment Level of Independence: Independent with assistive device(s)        Comments: was using cane and walker but was able to complete all ADLs and mobility modified indep        OT Problem List: Decreased strength;Decreased activity tolerance;Decreased range of motion;Impaired balance (sitting and/or standing);Impaired vision/perception;Decreased coordination;Decreased cognition;Decreased safety awareness;Decreased knowledge of use of DME or AE;Decreased knowledge of precautions;Cardiopulmonary status limiting activity;Impaired sensation;Impaired tone;Obesity;Impaired UE functional use;Increased edema      OT Treatment/Interventions: Self-care/ADL  training;Therapeutic exercise;Neuromuscular education;Energy conservation;DME and/or AE instruction;Manual therapy;Splinting;Therapeutic activities;Cognitive remediation/compensation;Visual/perceptual remediation/compensation;Balance training;Patient/family education    OT Goals(Current goals can be found in the care plan section) Acute Rehab OT Goals Patient Stated Goal: none stated by patient and wife wanting to speak with someone regarding VA benefits OT Goal Formulation: With patient/family Time For Goal Achievement: 10-19-17 Potential to Achieve Goals: Good  OT Frequency: Min 2X/week   Barriers to D/C:            Co-evaluation PT/OT/SLP Co-Evaluation/Treatment: Yes Reason for Co-Treatment: Complexity of the patient's impairments (multi-system involvement);Necessary to address cognition/behavior during functional activity;For patient/therapist safety;To address functional/ADL transfers   OT goals addressed during session: ADL's and self-care;Proper use of Adaptive equipment and DME;Strengthening/ROM      AM-PAC PT "6 Clicks" Daily Activity     Outcome Measure Help from another person eating meals?: Total Help from another person taking care of personal grooming?: Total Help from another person toileting, which includes using toliet, bedpan, or urinal?: Total Help from another person bathing (including washing, rinsing, drying)?: Total Help from another person to put on and taking off regular upper body clothing?: Total Help from another person to put on and taking off regular lower body clothing?: Total 6 Click Score: 6  End of Session Equipment Utilized During Treatment: Gait belt;Oxygen Nurse Communication: Mobility status;Precautions;Need for lift equipment  Activity Tolerance: Patient tolerated treatment well Patient left: in bed;with call bell/phone within reach;with family/visitor present;with SCD's reapplied;with bed alarm set  OT Visit Diagnosis: Unsteadiness on feet  (R26.81);Muscle weakness (generalized) (M62.81);Other symptoms and signs involving cognitive function;Hemiplegia and hemiparesis Hemiplegia - Right/Left: Right Hemiplegia - dominant/non-dominant: Dominant Hemiplegia - caused by: Nontraumatic intracerebral hemorrhage                Time: 1610-9604 OT Time Calculation (min): 30 min Charges:  OT General Charges $OT Visit: 1 Visit OT Evaluation $OT Eval High Complexity: 1 High G-CodesMateo Flow   OTR/L Pager: [phone] Office: [phone] .   Boone Master B 10/04/2017, 10:35 AM

## 2017-10-04 NOTE — Progress Notes (Signed)
Cortrak Tube Team Note:  Consult received to place a Cortrak feeding tube.   A 10 F Cortrak tube was placed in the left nare and secured with a nasal bridle at 78 cm. Per the Cortrak monitor reading the tube tip is at the pylorus.   X-ray is required, abdominal x-ray has been ordered by the Cortrak team. Please confirm tube placement before using the Cortrak tube.   If the tube becomes dislodged please keep the tube and contact the Cortrak team at [email or web address] (password [email or web address]) for replacement.  If after hours and replacement cannot be delayed, place a NG tube and confirm placement with an abdominal x-ray.    Vanessa Kick RD, LDN Clinical Nutrition Pager # [phone]

## 2017-10-04 NOTE — Evaluation (Signed)
Speech Language Pathology Evaluation Patient Details Name: Dwayne [name] MRN: [id number] DOB: [birth year] Today's Date: 10/04/2017 Time: 1330-1350 SLP Time Calculation (min) (ACUTE ONLY): 20 min  Problem List:  Patient Active Problem List   Diagnosis Date Noted  . ICH (intracerebral hemorrhage) (HCC) 09/29/2017  . Aortic atherosclerosis (HCC) 08/14/2017  . Chronic diastolic CHF (congestive heart failure) (HCC) 10/05/2016  . Mobitz I 10/05/2016  . Dizziness 07/14/2016  . Symptomatic bradycardia 07/14/2016  . Cholelithiasis and cholecystitis without obstruction 03/07/2016  . Diabetic neuropathy (HCC) 07/30/2014  . Adjustment disorder with depressed mood 02/24/2014  . Acid reflux 02/24/2014  . Diabetes mellitus, type 2 (HCC) 02/24/2014  . 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 Abnormal nuclear stress test 02/12/2014  . Hx of class III angina pectoris 02/12/2014  . History of class III angina pectoris 02/12/2014  . Lumbar vertebral fracture (HCC) 03/07/2013  . Angioedema 03/07/2013  . Depressive disorder, not elsewhere classified 02/25/2013  . Coronary artery disease involving native coronary artery of native heart without angina pectoris 01/30/2013  . Essential hypertension 01/30/2013  . IDDM (insulin dependent diabetes mellitus) (HCC) 01/30/2013  . Medication refill 01/30/2013  . Chest pain 08/03/2012  . Other reasons for seeking consultation 04/01/2011  . Anxiety 07/27/2010  . History of myocardial infarction 06/29/2010  . Presence of stent in coronary artery 06/29/2010  . Advance directive discussed with patient 06/04/2010  . Blood in the urine 01/17/2006  . HLD (hyperlipidemia) 01/17/2006  . Awareness of heartbeats 01/17/2006   Past Medical History:  Past Medical History:  Diagnosis Date  . Anxiety   . Aortic atherosclerosis (HCC) 08/14/2017  . Arthritis    "knees, elbows, hands" (02/12/2014)  . Compression  fracture of L2 (HCC) 03/08/2013  . COPD (chronic obstructive pulmonary disease) (HCC)   . Coronary artery disease    Dr. Delton See, [facility]- follows  . Depression   . Esophageal reflux   . Gallstones   . Hyperlipidemia   . Hypertension, benign    Benign  . 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 Myocardial infarction (HCC) 1998 X 2   "probably a couple since 1998" (02/12/2014)  . Palpitations   . Pneumonia ~ 2004 X 1  . Swallowing difficulty    intermittent-occ"doesn't matter with solid or liquid"   Past Surgical History:  Past Surgical History:  Procedure Laterality Date  . CARDIAC CATHETERIZATION  ~ 2012  . CATARACT EXTRACTION W/ INTRAOCULAR LENS IMPLANT Right 10/2013  . CHOLECYSTECTOMY N/A 03/07/2016   Procedure: LAPAROSCOPIC CHOLECYSTECTOMY WITH INTRAOPERATIVE CHOLANGIOGRAM;  Surgeon: Glenna Fellows, MD;  Location: [facility];  Service: General;  Laterality: N/A;  . CORONARY ANGIOPLASTY  1998  . CORONARY ANGIOPLASTY WITH STENT PLACEMENT  1999; 2014; 02/12/2014   "1; 2; 1"  . LEFT HEART CATHETERIZATION WITH CORONARY ANGIOGRAM N/A 02/12/2014   Procedure: LEFT HEART CATHETERIZATION WITH CORONARY ANGIOGRAM;  Surgeon: Lesleigh Noe, MD;  Location: [facility] CATH LAB;  Service: Cardiovascular;  Laterality: N/A;   HPI:  72 year old man with a history of COPD, coronary artery disease (on aspirin and Plavix), diabetes, hypertension, hyperlipidemia.  He developed acute inability to stand, right-sided weakness on 5/24, was found to be severely hypertensive to the 200s with a suppressed sensorium.  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  Intubated on 5/24, one way extubation on 5/28.    Assessment / Plan / Recommendation Clinical Impression  Pt demonstrates appearance of global aphasia with no attempts to communicate verbally  or via gestures or follow any commands. Pt did hold washcloth and wipe his face with context. He sustained eye contact deep in left visual field, but briefly. Occasional facial expression or vague intention to move hand with tactile cues. Pt quite lethargic so function may improve if pt maximally alert. Will f/u for further diagnostic assessment and intervention for functional communication    SLP Assessment  SLP Recommendation/Assessment: Patient needs continued Speech Lanaguage Pathology Services SLP Visit Diagnosis: Aphasia (R47.01)    Follow Up Recommendations  Skilled Nursing facility    Frequency and Duration min 2x/week  4 weeks      SLP Evaluation Cognition  Overall Cognitive Status: Impaired/Different from baseline Arousal/Alertness: Lethargic Orientation Level: (unable due to aphasia) Attention: Focused Focused Attention: Impaired Focused Attention Impairment: Verbal basic;Functional basic       Comprehension  Auditory Comprehension Overall Auditory Comprehension: Impaired Yes/No Questions: Impaired(no response) Commands: Impaired One Step Basic Commands: 0-24% accurate    Expression Verbal Expression Overall Verbal Expression: Impaired Initiation: Impaired Written Expression Dominant Hand: Right   Oral / Motor  Oral Motor/Sensory Function Overall Oral Motor/Sensory Function: Moderate impairment Facial ROM: Reduced right;Suspected CN VII (facial) dysfunction Facial Symmetry: Abnormal symmetry right;Suspected CN VII (facial) dysfunction Motor Speech Overall Motor Speech: (no attempts)   GO                   Harlon Ditty, MA CCC-SLP [phone]  Claudine Mouton 10/04/2017, 2:45 PM

## 2017-10-05 ENCOUNTER — Inpatient Hospital Stay (HOSPITAL_COMMUNITY): Payer: Medicare Other

## 2017-10-05 DIAGNOSIS — I610 Nontraumatic intracerebral hemorrhage in hemisphere, subcortical: Secondary | ICD-10-CM

## 2017-10-05 LAB — GLUCOSE, CAPILLARY
GLUCOSE-CAPILLARY: 288 mg/dL — AB (ref 65–99)
GLUCOSE-CAPILLARY: 268 mg/dL — AB (ref 65–99)
GLUCOSE-CAPILLARY: 260 mg/dL — AB (ref 65–99)
GLUCOSE-CAPILLARY: 290 mg/dL — AB (ref 65–99)
Glucose-Capillary: 237 mg/dL — ABNORMAL HIGH (ref 65–99)
Glucose-Capillary: 296 mg/dL — ABNORMAL HIGH (ref 65–99)

## 2017-10-05 LAB — CBC WITH DIFFERENTIAL/PLATELET
ABS IMMATURE GRANULOCYTES: 0.1 10*3/uL (ref 0.0–0.1)
BASOS ABS: 0.1 10*3/uL (ref 0.0–0.1)
Basophils Relative: 0 %
Eosinophils Absolute: 0.1 10*3/uL (ref 0.0–0.7)
Eosinophils Relative: 1 %
HCT: 40.6 % (ref 39.0–52.0)
HEMOGLOBIN: 13.7 g/dL (ref 13.0–17.0)
Immature Granulocytes: 1 %
LYMPHS ABS: 2.5 10*3/uL (ref 0.7–4.0)
LYMPHS PCT: 19 %
MCH: 32.5 pg (ref 26.0–34.0)
MCHC: 33.7 g/dL (ref 30.0–36.0)
MCV: 96.2 fL (ref 78.0–100.0)
MONO ABS: 1.5 10*3/uL — AB (ref 0.1–1.0)
Monocytes Relative: 12 %
NEUTROS ABS: 9.1 10*3/uL — AB (ref 1.7–7.7)
Neutrophils Relative %: 67 %
Platelets: 301 10*3/uL (ref 150–400)
RBC: 4.22 MIL/uL (ref 4.22–5.81)
RDW: 13.2 % (ref 11.5–15.5)
WBC: 13.4 10*3/uL — AB (ref 4.0–10.5)

## 2017-10-05 LAB — ECHOCARDIOGRAM COMPLETE
Height: 70 in
Weight: 3950.64 oz

## 2017-10-05 MED ORDER — FUROSEMIDE 20 MG PO TABS
20.0000 mg | ORAL_TABLET | ORAL | Status: DC
  Administered 2017-10-05: 20 mg via ORAL
  Filled 2017-10-05: qty 1

## 2017-10-05 MED ORDER — INSULIN GLARGINE 100 UNIT/ML ~~LOC~~ SOLN
16.0000 [IU] | Freq: Every day | SUBCUTANEOUS | Status: DC
  Administered 2017-10-05 – 2017-10-08 (×4): 16 [IU] via SUBCUTANEOUS
  Filled 2017-10-05 (×4): qty 0.16

## 2017-10-05 MED ORDER — FAMOTIDINE 20 MG PO TABS
20.0000 mg | ORAL_TABLET | Freq: Two times a day (BID) | ORAL | Status: DC
  Administered 2017-10-05 – 2017-10-13 (×16): 20 mg via ORAL
  Filled 2017-10-05 (×17): qty 1

## 2017-10-05 MED ORDER — PERFLUTREN LIPID MICROSPHERE
1.0000 mL | INTRAVENOUS | Status: AC | PRN
  Administered 2017-10-05: 2 mL via INTRAVENOUS
  Filled 2017-10-05: qty 10

## 2017-10-05 MED ORDER — ISOSORBIDE MONONITRATE 20 MG PO TABS
10.0000 mg | ORAL_TABLET | Freq: Two times a day (BID) | ORAL | Status: DC
  Administered 2017-10-05 – 2017-10-13 (×16): 10 mg via ORAL
  Filled 2017-10-05 (×19): qty 1

## 2017-10-05 MED ORDER — LISINOPRIL 20 MG PO TABS
40.0000 mg | ORAL_TABLET | Freq: Every day | ORAL | Status: DC
  Administered 2017-10-05: 40 mg via ORAL
  Filled 2017-10-05: qty 2

## 2017-10-05 NOTE — Progress Notes (Addendum)
STROKE TEAM PROGRESS NOTE   SUBJECTIVE (INTERVAL HISTORY) Wife present.  Discussed PEG and feeding with wife. She wants to pursue snf placement. Awaiting bed on floor.  OBJECTIVE Temp:  [98.3 F (36.8 C)-99.9 F (37.7 C)] 99.9 F (37.7 C) (05/30 1242) Pulse Rate:  [56-85] 72 (05/30 1500) Cardiac Rhythm: Heart block (05/30 0800) Resp:  [16-27] 22 (05/30 1500) BP: (106-197)/(59-96) 139/66 (05/30 1500) SpO2:  [93 %-98 %] 97 % (05/30 1500) Weight:  [112 kg (246 lb 14.6 oz)] 112 kg (246 lb 14.6 oz) (05/30 0600)  CBC:  Recent Labs  Lab 10/04/17 0241 10/05/17 0256  WBC 15.9* 13.4*  NEUTROABS 12.0* 9.1*  HGB 14.6 13.7  HCT 42.4 40.6  MCV 96.1 96.2  PLT 258 301    Basic Metabolic Panel:  Recent Labs  Lab 10/01/17 1801  10/02/17 0505 10/03/17 0421 10/04/17 0241  NA  --    < > 139 140 142  K  --    < > 3.8 3.7 3.8  CL  --    < > 102 103 105  CO2  --    < > GLUCOSE  --    < > 201* 277* 177*  BUN  --    < > 25* 33* 27*  CREATININE  --    < > 0.82 0.79 0.71  CALCIUM  --    < > 9.2 9.3 9.2  MG 1.8  --  1.9  --   --   PHOS 2.2*  --  3.7  --   --    < > = values in this interval not displayed.    PHYSICAL EXAM per Dr. Pearlean Brownie    Afebrile. Head is nontraumatic. Neck is supple without bruit.Cardiac exam no murmur or gallop. Lungs are clear to auscultation. Distal pulses are well felt. Neurological Exam :  Awake. Globally aphasic. Left gaze deviation. Unable to look to the right past midline. Does not blink to threat.  Right lower facial weakness. Does not follow any commands. Tongue midline. Cough and gag positive. Motor system exam reveals spontaneous antigravity and purposeful left upper movement, left arm action tremor.  Right upper extremity flicker  Withdraws bilateral lower extremity to pain but right  less than the left side. Right plantar upgoing left upgoing. Brisk reflexes.   ASSESSMENT/PLAN [name] is a 72 y.o. male with history of coronary artery  disease with previous MI, COPD with continued tobacco use, diabetes mellitus, hyperlipidemia, hypertension, depression, and anxiety presenting with onset of right-sided weakness followed by unresponsiveness.  He did not receive IV t-PA due to ICH.  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 Stable.   CT Head F/U 5/25  Essentially stable  Ct Head Wo Contrast - 10/02/2017 -- Unchanged  CTA Head - No acute or focal vascular lesion to explain the ICH. Bilateral carotid disease.  Carotid Doppler - B 80-99% stenosis  2D Echo - pending   LDL - 37  HgbA1c - 8.7  VTE prophylaxis - change to Lovenox 40 mg sq daily   aspirin 81 mg daily and clopidogrel 75 mg daily prior to admission, now on No antithrombotic due to bleed  Ongoing aggressive stroke risk factor management  Therapy recommendations:  CIR  Disposition:  Pending  Await transfer to the floor (2nd  day)  Carotid Stenosis  Severe B ICA stenosis 80-99%  Given hmg, unable to treat with antiplatelets  Not a surgical candidate at this time  AVOID HYPOTENSION  Respiratory distress d/t hemorrhage, resolved  Extubated 528 with plans for no reintubation   Dysphagia, secondary to stroke  NPO. Failed swallow. For PEG placement. Trauma contacted.  Hypertensive Emergency  BP as high as 196/154 on arrival . Treated with IV Cardene, now off . SBP goal < 180  . up to 190s during the night . Was on coreg - resumed home meds of lasix, imdur and lisinopril this am. Given severe ICA stenosis, will d/c lasix and lisinopril, keeping coreg and imdur.  . f/u BP tomorrow  Hyperlipidemia  Lipid lowering medication PTA:  Lipitor 80 mg daily  LDL 37, goal < 70  resume lower dose of Lipitor at time of discharge  Diabetes  HgbA1c 8.7, goal <  7.0  Uncontrolled  Glucoses elevated [phone]  Note recommendations for insulin - will hold until PEG placed tomorrow and TF restarted - NPO for PEG placement tonight  Other Stroke Risk Factors  Advanced age  Cigarette smoker will be advised to stop smoking  ETOH use, advised to drink no more than 1 alcoholic beverage per day.  Obesity, Body mass index is 35.43 kg/m., recommend weight loss, diet and exercise as appropriate  Coronary artery disease  Other Active Problems Bilateral carotid artery disease - check carotid Dopplers pending   Leukocytosis - 13.4 (temp 99.9) CXR stable.  Thought to be reactive in setting of hemorrhage.  Morphine allergy  Anxiety and depression history - consider resuming [facility] day # 6  Annie Main, MSN, APRN, ANVP-BC, AGPCNP-BC Advanced Practice Stroke Nurse [facility] See Amion for Schedule & Pager information 10/05/2017 3:10 PM  I have personally examined this patient, reviewed notes, independently viewed imaging studies, participated in medical decision making and plan of care.ROS completed by me personally and pertinent positives fully documented  I have made any additions or clarifications directly to the above note. Agree with note above. The patient remains globally aphasic with dense right hemiplegia and significant dysphagia. Family is agreeable to PEG tube and will consult trauma team for the same. Transfer to the floor bed. Transfer to skilled nursing facility for rehabilitation early next week.This patient is critically ill and at significant risk of neurological worsening, death and care requires constant monitoring of vital signs, hemodynamics,respiratory and cardiac monitoring, extensive review of multiple databases, frequent neurological assessment, discussion with family, other specialists and medical decision making of high complexity.I have made any additions or clarifications directly to the above  note.This critical care time does not reflect procedure time, or teaching time or supervisory time of PA/NP/Med Resident etc but could involve care discussion time.  I spent 30 minutes of neurocritical care time  in the care of  this patient.      Delia Heady, MD Medical Director [facility] Pager: [phone] 10/05/2017 5:15 PM  To contact Stroke Continuity provider, please refer to [email or web address]. After hours, contact General Neurology

## 2017-10-05 NOTE — Progress Notes (Signed)
Physical Therapy Treatment Patient Details Name: Dwayne [name] MRN: [id number] DOB: February 20, 1946 Today's Date: 10/05/2017    History of Present Illness 66 male s/p ICH L basal ganglia PMH: COPD, MI, CAD, HF    PT Comments    Patient progressing this session to OOB via lift.  Continues to be limited by lethargy, but able to interact some and work some on sitting balance leaning forward in chair.  Wife present and supportive.  RN informed pt to be lifted back to bed within 2 hours time.  PT to follow.  Will need post acute inpatient rehab upon d/c.    Follow Up Recommendations  CIR;Supervision/Assistance - 24 hour     Equipment Recommendations  Other (comment)(TBA)    Recommendations for Other Services Rehab consult     Precautions / Restrictions Precautions Precautions: Fall    Mobility  Bed Mobility Overal bed mobility: Needs Assistance Bed Mobility: Rolling Rolling: +2 for physical assistance;Total assist         General bed mobility comments: assist for placing lift pad under pt  Transfers Overall transfer level: Needs assistance     Sit to Stand: Total assist         General transfer comment: lifted to recliner with maxi sky lift  Ambulation/Gait                 Stairs             Wheelchair Mobility    Modified Rankin (Stroke Patients Only)       Balance Overall balance assessment: Needs assistance Sitting-balance support: Feet supported Sitting balance-Leahy Scale: Zero Sitting balance - Comments: A of 2 to sit up away from back of the chair, at times pushing to R with L UE                                    Cognition Arousal/Alertness: Lethargic Behavior During Therapy: WFL for tasks assessed/performed Overall Cognitive Status: Difficult to assess                     Current Attention Level: Focused   Following Commands: Follows one step commands inconsistently       General Comments: limited ability  to follow commands, some interaction this session with looking to L and winking or grimacing when attempting to get him to engage in sitting balance and head control activities.       Exercises Other Exercises Other Exercises: PROM/AAROM LE's    General Comments General comments (skin integrity, edema, etc.): VSS throughout; wife present throughout and attempting to engage pt      Pertinent Vitals/Pain Pain Assessment: Faces Faces Pain Scale: Hurts a little bit Pain Location: with movement some grimacing noted Pain Descriptors / Indicators: Grimacing Pain Intervention(s): Repositioned;Monitored during session    Home Living                      Prior Function            PT Goals (current goals can now be found in the care plan section) Progress towards PT goals: Progressing toward goals    Frequency    Min 4X/week      PT Plan Current plan remains appropriate    Co-evaluation              AM-PAC PT "6 Clicks" Daily Activity  Outcome Measure  Difficulty turning over in bed (including adjusting bedclothes, sheets and blankets)?: Unable Difficulty moving from lying on back to sitting on the side of the bed? : Unable Difficulty sitting down on and standing up from a chair with arms (e.g., wheelchair, bedside commode, etc,.)?: Unable Help needed moving to and from a bed to chair (including a wheelchair)?: Total Help needed walking in hospital room?: Total Help needed climbing 3-5 steps with a railing? : Total 6 Click Score: 6    End of Session Equipment Utilized During Treatment: Other (comment);Oxygen(sky lift) Activity Tolerance: Patient limited by fatigue Patient left: with call bell/phone within reach;in chair;with family/visitor present Nurse Communication: Mobility status;Need for lift equipment PT Visit Diagnosis: Other abnormalities of gait and mobility (R26.89);Other symptoms and signs involving the nervous system (R29.898);Hemiplegia and  hemiparesis Hemiplegia - Right/Left: Right Hemiplegia - dominant/non-dominant: Dominant Hemiplegia - caused by: Cerebral infarction     Time: 1610-9604 PT Time Calculation (min) (ACUTE ONLY): 25 min  Charges:  $Therapeutic Activity: 23-37 mins                    G CodesSheran Lawless, [place] [phone] 10/05/2017    Elray Mcgregor 10/05/2017, 5:12 PM

## 2017-10-05 NOTE — Consult Note (Signed)
Reason for Consult:need for PEG tube Referring Physician: P. Detrich Dwayne [name] is an 72 y.o. male.  HPI: Dwayne Dwayne [name] has a history of COPD, DM, CAD and HTN. He was admitted 5/24 with a large L basal ganglia/thalamic ICH. He was treated aggressively and has been successfully weaned from the ventilator. He has global aphasia and cannot participate with the history. Speech therapy recommends NPO at this time. Dr. Leonie [name] asked that we place a PEG tube. He is S/P laparoscopic cholecystectomy by Dr. Excell Dwayne [name] from our practice 10/17.  Past Medical History:  Diagnosis Date  . Anxiety   . Aortic atherosclerosis ([place]) 08/14/2017  . Arthritis    "knees, elbows, hands" (02/12/2014)  . Compression fracture of L2 ([place]) 03/08/2013  . COPD (chronic obstructive pulmonary disease) ([place])   . Coronary artery disease    Dr. Meda [name], [facility] Dwayne [name]- follows  . Depression   . Esophageal reflux   . Gallstones   . Hyperlipidemia   . Hypertension, benign    Benign  . 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 Myocardial infarction ([place]) 1998 X 2   "probably a couple since 1998" (02/12/2014)  . Palpitations   . Pneumonia ~ 2004 X 1  . Swallowing difficulty    intermittent-occ"doesn't matter with solid or liquid"    Past Surgical History:  Procedure Laterality Date  . CARDIAC CATHETERIZATION  ~ 2012  . CATARACT EXTRACTION W/ INTRAOCULAR LENS IMPLANT Right 10/2013  . CHOLECYSTECTOMY N/A 03/07/2016   Procedure: LAPAROSCOPIC CHOLECYSTECTOMY WITH INTRAOPERATIVE CHOLANGIOGRAM;  Surgeon: Dwayne Seltzer, MD;  Location: [facility] ORS;  Service: General;  Laterality: N/A;  . [place]  . CORONARY ANGIOPLASTY WITH STENT PLACEMENT  1999; 2014; 02/12/2014   "1; 2; 1"  . LEFT HEART CATHETERIZATION WITH CORONARY ANGIOGRAM N/A 02/12/2014   Procedure: LEFT HEART CATHETERIZATION WITH CORONARY ANGIOGRAM;  Surgeon: Dwayne Grooms, MD;  Location: [facility] CATH LAB;  Service:  Cardiovascular;  Laterality: N/A;    Family History  Problem Relation Age of Onset  . Heart attack Father   . Diabetes Father   . Stroke Neg Hx     Social History:  reports that he has been smoking cigarettes.  He has a 25.00 pack-year smoking history. He has never used smokeless tobacco. He reports that he drinks alcohol. He reports that he does not use drugs.  Allergies:  Allergies  Allergen Reactions  . Morphine And Related Anaphylaxis    Swelling of the tongue and face    Medications: reviewed  Results for orders placed or performed during the hospital encounter of 09/29/17 (from the past 48 hour(s))  Glucose, capillary     Status: Abnormal   Collection Time: 10/03/17  3:34 PM  Result Value Ref Range   Glucose-Capillary 196 (H) 65 - 99 mg/dL   Comment 1 Notify RN    Comment 2 Document in Chart   Glucose, capillary     Status: Abnormal   Collection Time: 10/03/17  7:31 PM  Result Value Ref Range   Glucose-Capillary 169 (H) 65 - 99 mg/dL  Glucose, capillary     Status: Abnormal   Collection Time: 10/03/17 11:26 PM  Result Value Ref Range   Glucose-Capillary 184 (H) 65 - 99 mg/dL  CBC with Differential/Platelet     Status: Abnormal   Collection Time: 10/04/17  2:41 AM  Result Value Ref Range   WBC 15.9 (H) 4.0 - 10.5 K/uL  RBC 4.41 4.22 - 5.81 MIL/uL   Hemoglobin 14.6 13.0 - 17.0 g/dL   HCT 42.4 39.0 - 52.0 %   MCV 96.1 78.0 - 100.0 fL   MCH 33.1 26.0 - 34.0 pg   MCHC 34.4 30.0 - 36.0 g/dL   RDW 13.4 11.5 - 15.5 %   Platelets 258 150 - 400 K/uL   Neutrophils Relative % 76 %   Neutro Abs 12.0 (H) 1.7 - 7.7 K/uL   Lymphocytes Relative 13 %   Lymphs Abs 2.1 0.7 - 4.0 K/uL   Monocytes Relative 9 %   Monocytes Absolute 1.5 (H) 0.1 - 1.0 K/uL   Eosinophils Relative 1 %   Eosinophils Absolute 0.1 0.0 - 0.7 K/uL   Basophils Relative 0 %   Basophils Absolute 0.1 0.0 - 0.1 K/uL   Immature Granulocytes 1 %   Abs Immature Granulocytes 0.1 0.0 - 0.1 K/uL    Comment:  Performed at [facility] [address] [place], [place] [address]  Procalcitonin     Status: None   Collection Time: 10/04/17  2:41 AM  Result Value Ref Range   Procalcitonin <0.10 ng/mL    Comment:        Interpretation: PCT (Procalcitonin) <= 0.5 ng/mL: Systemic infection (sepsis) is not likely. Local bacterial infection is possible. (NOTE)       Sepsis PCT Algorithm           Lower Respiratory Tract                                      Infection PCT Algorithm    ----------------------------     ----------------------------         PCT < 0.25 ng/mL                PCT < 0.10 ng/mL         Strongly encourage             Strongly discourage   discontinuation of antibiotics    initiation of antibiotics    ----------------------------     -----------------------------       PCT 0.25 - 0.50 ng/mL            PCT 0.10 - 0.25 ng/mL               OR       >80% decrease in PCT            Discourage initiation of                                            antibiotics      Encourage discontinuation           of antibiotics    ----------------------------     -----------------------------         PCT >= 0.50 ng/mL              PCT 0.26 - 0.50 ng/mL               AND        <80% decrease in PCT             Encourage initiation of  antibiotics       Encourage continuation           of antibiotics    ----------------------------     -----------------------------        PCT >= 0.50 ng/mL                  PCT > 0.50 ng/mL               AND         increase in PCT                  Strongly encourage                                      initiation of antibiotics    Strongly encourage escalation           of antibiotics                                     -----------------------------                                           PCT <= 0.25 ng/mL                                                 OR                                        > 80%  decrease in PCT                                     Discontinue / Do not initiate                                             antibiotics Performed at [facility], [address], [place], [place] [address]   Basic metabolic panel     Status: Abnormal   Collection Time: 10/04/17  2:41 AM  Result Value Ref Range   Sodium 142 135 - 145 mmol/L   Potassium 3.8 3.5 - 5.1 mmol/L   Chloride 105 101 - 111 mmol/L   CO2 27 22 - 32 mmol/L   Glucose, Bld 177 (H) 65 - 99 mg/dL   BUN 27 (H) 6 - 20 mg/dL   Creatinine, Ser 0.71 0.61 - 1.24 mg/dL   Calcium 9.2 8.9 - 10.3 mg/dL   GFR calc non Af Amer >60 >60 mL/min   GFR calc Af Amer >60 >60 mL/min    Comment: (NOTE) The eGFR has been calculated using the CKD EPI equation. This calculation has not been validated in all clinical situations. eGFR's persistently <60 mL/min signify possible Chronic Kidney Disease.    Anion gap 10 5 - 15    Comment: Performed  at [facility], [place] [address] [place], [place] [address]  Glucose, capillary     Status: Abnormal   Collection Time: 10/04/17  3:38 AM  Result Value Ref Range   Glucose-Capillary 183 (H) 65 - 99 mg/dL  Glucose, capillary     Status: Abnormal   Collection Time: 10/04/17  7:32 AM  Result Value Ref Range   Glucose-Capillary 188 (H) 65 - 99 mg/dL   Comment 1 Notify RN    Comment 2 Document in Chart   Glucose, capillary     Status: Abnormal   Collection Time: 10/04/17 11:03 AM  Result Value Ref Range   Glucose-Capillary 184 (H) 65 - 99 mg/dL  Glucose, capillary     Status: Abnormal   Collection Time: 10/04/17  4:33 PM  Result Value Ref Range   Glucose-Capillary 230 (H) 65 - 99 mg/dL  Glucose, capillary     Status: Abnormal   Collection Time: 10/04/17  7:46 PM  Result Value Ref Range   Glucose-Capillary 218 (H) 65 - 99 mg/dL  Glucose, capillary     Status: Abnormal   Collection Time: 10/04/17 11:54 PM  Result Value Ref Range   Glucose-Capillary 268 (H) 65 - 99 mg/dL  CBC  with Differential/Platelet     Status: Abnormal   Collection Time: 10/05/17  2:56 AM  Result Value Ref Range   WBC 13.4 (H) 4.0 - 10.5 K/uL   RBC 4.22 4.22 - 5.81 MIL/uL   Hemoglobin 13.7 13.0 - 17.0 g/dL   HCT 40.6 39.0 - 52.0 %   MCV 96.2 78.0 - 100.0 fL   MCH 32.5 26.0 - 34.0 pg   MCHC 33.7 30.0 - 36.0 g/dL   RDW 13.2 11.5 - 15.5 %   Platelets 301 150 - 400 K/uL   Neutrophils Relative % 67 %   Neutro Abs 9.1 (H) 1.7 - 7.7 K/uL   Lymphocytes Relative 19 %   Lymphs Abs 2.5 0.7 - 4.0 K/uL   Monocytes Relative 12 %   Monocytes Absolute 1.5 (H) 0.1 - 1.0 K/uL   Eosinophils Relative 1 %   Eosinophils Absolute 0.1 0.0 - 0.7 K/uL   Basophils Relative 0 %   Basophils Absolute 0.1 0.0 - 0.1 K/uL   Immature Granulocytes 1 %   Abs Immature Granulocytes 0.1 0.0 - 0.1 K/uL    Comment: Performed at [facility], [name], [place], [place] [address]  Glucose, capillary     Status: Abnormal   Collection Time: 10/05/17  3:46 AM  Result Value Ref Range   Glucose-Capillary 288 (H) 65 - 99 mg/dL  Glucose, capillary     Status: Abnormal   Collection Time: 10/05/17  7:43 AM  Result Value Ref Range   Glucose-Capillary 268 (H) 65 - 99 mg/dL   Comment 1 Document in Chart     Dg Chest Port 1 View  Result Date: 10/04/2017 CLINICAL DATA:  Acute respiratory failure. EXAM: PORTABLE CHEST 1 VIEW COMPARISON:  10/03/2017 and prior radiographs FINDINGS: Endotracheal tube, NG tube and LEFT central venous catheter have been removed. UPPER limits normal heart size again noted. Peribronchial thickening/interstitial opacities are unchanged. There is no evidence of focal airspace disease, pulmonary edema, suspicious pulmonary nodule/mass, pleural effusion, or pneumothorax. No acute bony abnormalities are identified. IMPRESSION: Support apparatus removal without other significant change. Electronically Signed   By: Margarette Canada M.D.   On: 10/04/2017 09:02   Dg Abd Portable 1v  Result Date:  10/04/2017 CLINICAL  DATA:  Confirm feeding tube placement EXAM: PORTABLE ABDOMEN - 1 VIEW COMPARISON:  None. FINDINGS: The distal tip of the feeding tube is near the gastric fundus, pulled back several cm in the interval. IMPRESSION: The distal tip of the feeding tube is in the gastric fundus, pulled back several cm in the interval. Electronically Signed   By: Dorise Bullion III M.D   On: 10/04/2017 18:53   Dg Abd Portable 1v  Result Date: 10/04/2017 CLINICAL DATA:  Feeding tube placement. EXAM: PORTABLE ABDOMEN - 1 VIEW COMPARISON:  None. FINDINGS: A small bore feeding tube is identified at the peri pyloric region of the stomach. IMPRESSION: Small bore feeding tube at the peri pyloric stomach. Electronically Signed   By: Margarette Canada M.D.   On: 10/04/2017 15:22    Review of Systems  Unable to perform ROS: Patient nonverbal   Blood pressure 135/75, pulse 65, temperature 99.9 F (37.7 C), temperature source Axillary, resp. rate (!) 21, height '5\' 10"'  (1.778 m), weight 112 kg (246 lb 14.6 oz), SpO2 98 %. Physical Exam  Constitutional: He appears well-developed and well-nourished. No distress.  HENT:  Head: Normocephalic.  Cortrak  Neck: Neck supple. No thyromegaly present.  Cardiovascular: Normal rate and normal heart sounds.  Respiratory: Effort normal and breath sounds normal. No respiratory distress. He has no wheezes.  GI: Soft. He exhibits no distension. There is no tenderness. There is no rebound and no guarding.  No upper abdominal scars  Neurological:  Aphasic, some purposeful movement on L side, does not F/C  Skin: Skin is warm.    Assessment/Plan: Dysphagia S/P large L basal ganglia/thalamic ICH - I offered PEG tube placement to be done tomorrow by Dr. Hulen Skains in Endoscopy with anesthesia support. I discussed the procedure, risks, and benefits with his wife and I answered her questions. She agrees. Scheduled for 5/31 at 1300.  Zenovia Jarred 10/05/2017, 12:58 PM

## 2017-10-05 NOTE — Progress Notes (Signed)
Inpatient Diabetes Program Recommendations  AACE/ADA: New Consensus Statement on Inpatient Glycemic Control (2015)  Target Ranges:  Prepandial:   less than 140 mg/dL      Peak postprandial:   less than 180 mg/dL (1-2 hours)      Critically ill patients:  140 - 180 mg/dL   Lab Results  Component Value Date   GLUCAP 268 (H) 10/05/2017   HGBA1C 8.7 (H) 09/29/2017    Review of Glycemic ControlResults for ESAUL, DORWART (MRN [id number]) as of 10/05/2017 10:56  Ref. Range 10/04/2017 16:33 10/04/2017 19:46 10/04/2017 23:54 10/05/2017 03:46 10/05/2017 07:43  Glucose-Capillary Latest Ref Range: 65 - 99 mg/dL 409 (H) 811 (H) 914 (H) 288 (H) 268 (H)    Diabetes history: Type 2 DM  Outpatient Diabetes medications: Glipizide XL 10 mg daily, Metformin 1000 mg bid  Current orders for Inpatient glycemic control:  Novolog moderate q 4 hours, Lantus 16 units q HS, Jevity 65 ml/hr Inpatient Diabetes Program Recommendations:    Note blood sugars increased with tube feeds. May consider adding Novolog tube feed coverage 3 units q 4 hours.    Thanks,  Beryl Meager, RN, BC-ADM Inpatient Diabetes Coordinator Pager [phone] (8a-5p)

## 2017-10-05 NOTE — Progress Notes (Signed)
  Speech Language Pathology Treatment: Dysphagia;Cognitive-Linquistic  Patient Details Name: Dwayne [name] MRN: [id number] DOB: [birth year] Today's Date: 10/05/2017 Time: 1010-1020 SLP Time Calculation (min) (ACUTE ONLY): 10 min  Assessment / Plan / Recommendation Clinical Impression  Pt demonstrates no improvement in function today for SLP despite max intervention to improve responsiveness and arousal. Providers report pt very alert this am, following 50% of commands, but this was not seen in my session. Pt briefly arousable with upright position, oral care, max stim without ability to mobilize out of bed. Pt did not follow commands or sustain attention. He reached for a cup of water in left visual field x1, but no sufficient oral manipulation with water, ice or puree, all suctioned from oral cavity. Pt furthermore shows signs of pharyngeal dysphagia regardless of arousal including dysphonia (heard with moaning), weak reflexive cough and poor independent management of thick standing oral secretions. Prognosis for adequate/safe PO intake in the short term is poor.   HPI HPI: 72 year old man with a history of COPD, coronary artery disease (on aspirin and Plavix), diabetes, hypertension, hyperlipidemia.  He developed acute inability to stand, right-sided weakness on 5/24, was found to be severely hypertensive to the 200s with a suppressed sensorium.  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 Intubated on 5/24, one way extubation on 5/28.       SLP Plan  Continue with current plan of care       Recommendations  Diet recommendations: NPO                Follow up Recommendations: Skilled Nursing facility SLP Visit Diagnosis: Aphasia (R47.01) Plan: Continue with current plan of care       GO               [facility], MA CCC-SLP [phone]  Dwayne [name] 10/05/2017, 10:30 AM

## 2017-10-05 NOTE — Progress Notes (Signed)
Preliminary results by tech - Carotid duplex completed. Positive for bilateral 80-99% stenosis in the internal carotid arteries. Results given to [facility]. Marilynne Halsted, BS, RDMS, RVT

## 2017-10-05 NOTE — Progress Notes (Signed)
PULMONARY / CRITICAL CARE MEDICINE   Name: Dwayne [name] MRN: [id number] DOB: 02-12-1946    ADMISSION DATE:  09/29/2017 CONSULTATION DATE:  09/29/17  REFERRING MD:  [facility] Neurology.  CHIEF COMPLAINT:  Obtundation.   HISTORY OF PRESENT ILLNESS:   72 year old man with a history of COPD, coronary artery disease (on aspirin and Plavix), diabetes, hypertension, hyperlipidemia.  He developed acute inability to stand, right-sided weakness on 5/24, was found to be severely hypertensive to the 200s with a suppressed sensorium.  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  He was started on nicardipine for blood pressure management and admitted for further care.   After discussion with family 5/28.  At their request a temporary feeding tube was placed on 5/29 but they do not wish to have a PEG tube placed. He is a little lighter for me today and is following some simple instructions.        VITAL SIGNS: BP (!) 189/84   Pulse 70   Temp 98.8 F (37.1 C) (Oral)   Resp (!) 25   Ht  (1.778 m)   Wt 246 lb 14.6 oz (112 kg)   SpO2 98%   BMI 35.43 kg/m   HEMODYNAMICS:    VENTILATOR SETTINGS:    INTAKE / OUTPUT: I/O last 3 completed shifts: In: 883.3 [NG/GT:783.3; IV Piggyback:100] Out: 2125 [Urine:2125]  PHYSICAL EXAMINATION: General: More alert and tracking.  Breathing comfortably on a nasal cannula, in no overt distress Neuro: He is orienting towards the speaker this morning and following some simple instructions with the left hand.  He does not attempt speech. the right side remains plegic Cardiovascular: S1 and S2 are somewhat distant and regular without murmur rub or gallop   Lungs: There is less upper airway gurgling this morning.  Respirations are unlabored, there is symmetric air movement, few scattered rhonchi, no wheezes  Abdomen: The abdomen is soft  without any organomegaly masses tenderness guarding or rebound  Musculoskeletal:  No deformities Skin:  Warm, dry.  No obvious rashes, lesions, or ulcerations  LABS:  BMET Recent Labs  Lab 10/02/17 0505 10/03/17 0421 10/04/17 0241  NA 139 140 142  K 3.8 3.7 3.8  CL 102 103 105  CO2 BUN 25* 33* 27*  CREATININE 0.82 0.79 0.71  GLUCOSE 201* 277* 177*    Electrolytes Recent Labs  Lab 10/01/17 0214 10/01/17 1801  10/02/17 0505 10/03/17 0421 10/04/17 0241  CALCIUM 8.8*  --    < > 9.2 9.3 9.2  MG 1.7 1.8  --  1.9  --   --   PHOS 2.6 2.2*  --  3.7  --   --    < > = values in this interval not displayed.    CBC Recent Labs  Lab 10/03/17 0421 10/04/17 0241 10/05/17 0256  WBC 14.9* 15.9* 13.4*  HGB 13.0 14.6 13.7  HCT 39.0 42.4 40.6  PLT 242 258 301    Coag's Recent Labs  Lab 09/29/17 1053  APTT 27  INR 0.97    Sepsis Markers Recent Labs  Lab 09/29/17 1056 10/02/17 0721 10/03/17 0421 10/04/17 0241  LATICACIDVEN 2.35*  --   --   --   PROCALCITON  --  0.13 <0.10 <0.10    ABG Recent Labs  Lab 09/29/17 1542 10/03/17 0438  PHART 7.353 7.407  PCO2ART 49.3* 43.7  PO2ART 493.0*  99.4    Liver Enzymes Recent Labs  Lab 09/29/17 1053  AST 17  ALT 17  ALKPHOS 62  BILITOT 1.0  ALBUMIN 4.0    Cardiac Enzymes No results for input(s): TROPONINI, PROBNP in the last 168 hours.  Glucose Recent Labs  Lab 10/04/17 0732 10/04/17 1103 10/04/17 1633 10/04/17 1946 10/04/17 2354 10/05/17 0346  GLUCAP 188* 184* 230* 218* 268* 288*    Imaging Dg Abd Portable 1v  Result Date: 10/04/2017 CLINICAL DATA:  Confirm feeding tube placement EXAM: PORTABLE ABDOMEN - 1 VIEW COMPARISON:  None. FINDINGS: The distal tip of the feeding tube is near the gastric fundus, pulled back several cm in the interval. IMPRESSION: The distal tip of the feeding tube is in the gastric fundus, pulled back several cm in the interval. Electronically Signed   By: Gerome Sam III M.D   On: 10/04/2017 18:53   Dg Abd Portable 1v  Result Date: 10/04/2017 CLINICAL DATA:  Feeding tube placement. EXAM: PORTABLE ABDOMEN - 1 VIEW COMPARISON:  None. FINDINGS: A small bore feeding tube is identified at the peri pyloric region of the stomach. IMPRESSION: Small bore feeding tube at the peri pyloric stomach. Electronically Signed   By: Harmon Pier M.D.   On: 10/04/2017 15:22    STUDIES:  N/A  CULTURES:  ANTIBIOTICS:  SIGNIFICANT EVENTS: Intubated 5/24 for airway protection.  LINES/TUBES: ETT 5/24 Left [place] TLC 5/24>>  DISCUSSION: Acute deep white matter hemorrhage consistent with a hypertensive bleed.  ICH score: 3 .  Family has elected not to reintubate should he develop respiratory difficulties  ASSESSMENT / PLAN:  PULMONARY A: He is in no respiratory distress at present, and actually seems to be handling his upper airway secretions a little better today.  Family has asked that we do not reintubate and is no aggressive interventions are desired, the critical care service will not routinely follow, please reconsult [place] if needed    ENDOCRINE A:   Hyperglycemia Hx: Type II DM on oral hypoglycemic agents at home P:   CBG's SSI He is still hyperglycemic and I have increased his basal insulin today   NEUROLOGIC A:   High-grade intracranial hemorrhage.  High risk of long-term disability.  At risk for worsening cerebral edema. P: This is day 6 following hypertensive left basal ganglia hemorrhage      Penny Pia, MD Critical Care Medicine Pager: [phone] After hours [phone]  10/05/2017, 7:14 AM

## 2017-10-06 ENCOUNTER — Inpatient Hospital Stay (HOSPITAL_COMMUNITY): Payer: Medicare Other | Admitting: Anesthesiology

## 2017-10-06 ENCOUNTER — Encounter (HOSPITAL_COMMUNITY): Payer: Self-pay | Admitting: Certified Registered Nurse Anesthetist

## 2017-10-06 ENCOUNTER — Encounter (HOSPITAL_COMMUNITY)
Admission: EM | Disposition: A | Discharge status: Skilled Nursing Facility | Payer: Self-pay | Source: Home / Self Care | Attending: Neurology

## 2017-10-06 HISTORY — PX: ESOPHAGOGASTRODUODENOSCOPY (EGD) WITH PROPOFOL: SHX5813

## 2017-10-06 HISTORY — PX: PEG PLACEMENT: SHX5437

## 2017-10-06 LAB — CBC WITH DIFFERENTIAL/PLATELET
Abs Immature Granulocytes: 0.1 10*3/uL (ref 0.0–0.1)
Basophils Absolute: 0.1 10*3/uL (ref 0.0–0.1)
Basophils Relative: 1 %
EOS ABS: 0.1 10*3/uL (ref 0.0–0.7)
Eosinophils Relative: 1 %
HCT: 43.2 % (ref 39.0–52.0)
Hemoglobin: 14.3 g/dL (ref 13.0–17.0)
IMMATURE GRANULOCYTES: 1 %
Lymphocytes Relative: 18 %
Lymphs Abs: 2.4 10*3/uL (ref 0.7–4.0)
MCH: 32.4 pg (ref 26.0–34.0)
MCHC: 33.1 g/dL (ref 30.0–36.0)
MCV: 97.7 fL (ref 78.0–100.0)
MONO ABS: 1.6 10*3/uL — AB (ref 0.1–1.0)
MONOS PCT: 12 %
NEUTROS PCT: 69 %
Neutro Abs: 9.5 10*3/uL — ABNORMAL HIGH (ref 1.7–7.7)
PLATELETS: 307 10*3/uL (ref 150–400)
RBC: 4.42 MIL/uL (ref 4.22–5.81)
RDW: 13.2 % (ref 11.5–15.5)
WBC: 13.7 10*3/uL — ABNORMAL HIGH (ref 4.0–10.5)

## 2017-10-06 LAB — BASIC METABOLIC PANEL
Anion gap: 8 (ref 5–15)
BUN: 41 mg/dL — AB (ref 6–20)
CALCIUM: 9.4 mg/dL (ref 8.9–10.3)
CO2: 27 mmol/L (ref 22–32)
Chloride: 108 mmol/L (ref 101–111)
Creatinine, Ser: 0.8 mg/dL (ref 0.61–1.24)
GFR calc Af Amer: >60 mL/min (ref >60)
GLUCOSE: 299 mg/dL — AB (ref 65–99)
Potassium: 3.8 mmol/L (ref 3.5–5.1)
Sodium: 143 mmol/L (ref 135–145)

## 2017-10-06 LAB — GLUCOSE, CAPILLARY
Glucose-Capillary: 296 mg/dL — ABNORMAL HIGH (ref 65–99)
Glucose-Capillary: 188 mg/dL — ABNORMAL HIGH (ref 65–99)
Glucose-Capillary: 265 mg/dL — ABNORMAL HIGH (ref 65–99)
Glucose-Capillary: 264 mg/dL — ABNORMAL HIGH (ref 65–99)

## 2017-10-06 SURGERY — ESOPHAGOGASTRODUODENOSCOPY (EGD) WITH PROPOFOL
Anesthesia: Monitor Anesthesia Care

## 2017-10-06 MED ORDER — SODIUM CHLORIDE 0.9 % IV SOLN
INTRAVENOUS | Status: DC | PRN
  Administered 2017-10-06: 13:00:00 via INTRAVENOUS

## 2017-10-06 MED ORDER — PROPOFOL 500 MG/50ML IV EMUL
INTRAVENOUS | Status: DC | PRN
  Administered 2017-10-06: 50 ug/kg/min via INTRAVENOUS

## 2017-10-06 MED ORDER — PROPOFOL 10 MG/ML IV BOLUS
INTRAVENOUS | Status: DC | PRN
  Administered 2017-10-06 (×3): 15 mg via INTRAVENOUS

## 2017-10-06 NOTE — Progress Notes (Signed)
Patient transferred from 4N. Patient mute but responds to voice. Patient made comfortable. Will continue to monitor..Marland Kitchen

## 2017-10-06 NOTE — Transfer of Care (Signed)
Immediate Anesthesia Transfer of Care Note  Patient: Dwayne [name]  Procedure(s) Performed: ESOPHAGOGASTRODUODENOSCOPY (EGD) WITH PROPOFOL (N/A ) PERCUTANEOUS ENDOSCOPIC GASTROSTOMY (PEG) PLACEMENT (N/A )  Patient Location: Endoscopy Unit  Anesthesia Type:MAC  Level of Consciousness: drowsy and responds to stimulation  Airway & Oxygen Therapy: Patient Spontanous Breathing and Patient connected to nasal cannula oxygen  Post-op Assessment: Report given to RN and Post -op Vital signs reviewed and stable  Post vital signs: Reviewed and stable  Last Vitals:  Vitals Value Taken Time  BP 105/44 10/06/2017  1:54 PM  Temp 32 C 10/06/2017  1:54 PM  Pulse 68 10/06/2017  1:54 PM  Resp 21 10/06/2017  1:54 PM  SpO2 96 % 10/06/2017  1:54 PM  Vitals shown include unvalidated device data.  Last Pain:  Vitals:   10/06/17 1354  TempSrc: Oral         Complications: No apparent anesthesia complications

## 2017-10-06 NOTE — Progress Notes (Signed)
SLP Cancellation Note  Patient Details Name: Dwayne [name] MRN: [id number] DOB: 10/05/1945   Cancelled treatment:       Reason Eval/Treat Not Completed: Other (comment) Pt for PEG today. Will continue to follow.   Maxcine Hamaiewonsky, Jamaiyah Pyle 10/06/2017, 1:15 PM  Maxcine HamLaura Paiewonsky, M.A. CCC-SLP [phone]

## 2017-10-06 NOTE — Anesthesia Preprocedure Evaluation (Addendum)
Anesthesia Evaluation  Patient identified by MRN, date of birth, ID band Patient confused    Reviewed: Allergy & Precautions, H&P , NPO status , Patient's Chart, lab work & pertinent test results, reviewed documented beta blocker date and time , Unable to perform ROS - Chart review only  Airway Mallampati: III  TM Distance: >3 FB Neck ROM: Full    Dental no notable dental hx. (+) Teeth Intact, Dental Advisory Given   Pulmonary COPD,  COPD inhaler, Current Smoker,    Pulmonary exam normal breath sounds clear to auscultation       Cardiovascular hypertension, Pt. on medications and Pt. on home beta blockers + CAD, + Past MI, + Cardiac Stents, + Peripheral Vascular Disease and +CHF   Rhythm:Regular Rate:Normal  '19 TTE - Moderate LVH. EF 55% to 60%. Grade 1 diastolic dysfunction. Left atrium was moderately dilated.   Neuro/Psych Anxiety Depression '19 CT Head - Intraparenchymal hematoma centered in the left thalamus and basal ganglia is unchanged in size. There is persistent intraventricular extension of blood, which is also unchanged. The size and configuration of the ventricles are unchanged. Mass effect on the left lateral ventricle is unchanged. Midline shift remains 74m to the right. Hypoattenuating focus in the right frontal white matter is unchanged.  Aphasic CVA, Residual Symptoms    GI/Hepatic Neg liver ROS, GERD  Medicated and Controlled,Intermittent dysphagia   Endo/Other  diabetes, Poorly Controlled, Type 2, Oral Hypoglycemic AgentsMorbid obesityObesity  Renal/GU negative Renal ROS  negative genitourinary   Musculoskeletal  (+) Arthritis , Osteoarthritis,    Abdominal   Peds  Hematology negative hematology ROS (+)   Anesthesia Other Findings   Reproductive/Obstetrics negative OB ROS                            Anesthesia Physical  Anesthesia Plan  ASA: IV  Anesthesia Plan: MAC    Post-op Pain Management:    Induction: Intravenous  PONV Risk Score and Plan: Propofol infusion and Treatment may vary due to age or medical condition  Airway Management Planned: Nasal Cannula  Additional Equipment: None  Intra-op Plan:   Post-operative Plan:   Informed Consent: I have reviewed the patients History and Physical, chart, labs and discussed the procedure including the risks, benefits and alternatives for the proposed anesthesia with the patient or authorized representative who has indicated his/her understanding and acceptance.     Plan Discussed with: CRNA, Anesthesiologist and Surgeon  Anesthesia Plan Comments:        Anesthesia Quick Evaluation

## 2017-10-06 NOTE — Op Note (Signed)
[facility] Patient Name: Dwayne [name] Procedure Date : 10/06/2017 MRN: [id number] Attending MD: Kathrin Ruddy, MD Date of Birth: 03-01-1946 CSN: [id number] Age: 72 Admit Type: Inpatient Procedure:                Upper GI endoscopy Indications:              Place PEG because patient is unable to eat, Place                            PEG due to impaired swallowing, Place PEG due to                            aspiration risk, Place PEG due to neurological                            disorder causing impaired swallowing, Place PEG                            because patient is unable to eat due to stroke (CVA) Providers:                Kathrin Ruddy, MD, Norman Clay, RN, Verita Schneiders,                            Technician Referring MD:              Medicines:                Propofol per Anesthesia Complications:            No immediate complications. Estimated Blood Loss:     Estimated blood loss: none. Procedure:                Pre-Anesthesia Assessment:                           - This assessment was completed at 06:00 AM, prior                            to the administration of sedation.                           - Prior to the procedure, a History and Physical                            was performed, and patient medications and                            allergies were reviewed. The patient is unable to                            give consent secondary to the patient's altered                            mental status. The risks and benefits of the  procedure and the sedation options and risks were                            discussed with the patient's spouse. All questions                            were answered and informed consent was obtained.                            Patient identification and proposed procedure were                            verified by the physician and the nurse Patient's                            room. Mental  Status Examination: lethargic. Airway                            Examination: normal oropharyngeal airway and neck                            mobility. Respiratory Examination: clear to                            auscultation. CV Examination: normal. ASA Grade                            Assessment: III - A patient with severe systemic                            disease. After reviewing the risks and benefits,                            the patient was deemed in satisfactory condition to                            undergo the procedure. The anesthesia plan was to                            use moderate sedation / analgesia (conscious                            sedation). Immediately prior to administration of                            medications, the patient was re-assessed for                            adequacy to receive sedatives. The heart rate,                            respiratory rate, oxygen saturations, blood  pressure, adequacy of pulmonary ventilation, and                            response to care were monitored throughout the                            procedure. The physical status of the patient was                            re-assessed after the procedure.                           After obtaining informed consent, the endoscope was                            passed under direct vision. Throughout the                            procedure, the patient's blood pressure, pulse, and                            oxygen saturations were monitored continuously. The                            EG-[id number] ([id number]) scope was introduced through the                            mouth, and advanced to the second part of duodenum.                            The upper GI endoscopy was accomplished without                            difficulty. The patient tolerated the procedure                            well. The total duration of the procedure was 10                             minutes. Scope In: Scope Out: Findings:      The esophagus was normal.      The examined duodenum was normal.      The esophagus was normal.      The examined duodenum was normal.      The anterior wall of the stomach was normal. The patient was placed in       the supine position for PEG placement. The stomach was insufflated to       appose gastric and abdominal walls. A site was located in the body of       the stomach with excellent manual external pressure for placement. The       abdominal wall was marked and prepped in a sterile manner. The area was       anesthetized with 4 mL of 1% lidocaine. The trocar needle was introduced       through the abdominal  wall and into the stomach under direct endoscopic       view. A snare was introduced through the endoscope and opened in the       gastric lumen. The guide wire was passed through the trocar and into the       open snare. The snare was closed around the guide wire. The endoscope       and snare were removed, pulling the wire out through the mouth. A skin       incision was made at the site of needle insertion. The externally       removable 24 Fr Bard gastrostomy tube was lubricated. The G-tube was       tied to the guide wire and pulled through the mouth and into the       stomach. The trocar needle was removed, and the gastrostomy tube was       pulled out from the stomach through the skin. The external bumper was       attached to the gastrostomy tube, and the tube was cut to remove the       guide wire. The final position of the gastrostomy tube was confirmed by       relook endoscopy, and skin marking noted to be 4 cm at the external       bumper. The final tension and compression of the abdominal wall by the       PEG tube and external bumper were checked and revealed that the bumper       was moderately tight and mildly deforming the skin and that the PEG       balloon was moderately tight and mildly  compressing the stomach. The       feeding tube was capped, and the tube site cleaned and dressed. Impression:               - Normal esophagus.                           - Normal examined duodenum.                           - Normal esophagus.                           - Normal examined duodenum.                           - Normal anterior wall of the stomach.                           - An externally removable PEG placement was                            successfully completed.                           - No specimens collected. Recommendation:           - Please follow the post-PEG recommendations                            including: may use PEG today for meds and water,  use PEG today after checked by physician and start                            using PEG today. Procedure Code(s):        --- Professional ---                           [phone], Esophagogastroduodenoscopy, flexible,                            transoral; with directed placement of percutaneous                            gastrostomy tube Diagnosis Code(s):        --- Professional ---                           R63.3, Feeding difficulties                           Z43.1, Encounter for attention to gastrostomy                           R13.10, Dysphagia, unspecified                           R29.818, Other symptoms and signs involving the                            nervous system CPT copyright 2017 [employer]. All rights reserved. The codes documented in this report are preliminary and upon coder review may  be revised to meet current compliance requirements. Kathrin Ruddy, MD Jimmye Norman III, MD 10/06/2017 2:03:48 PM This report has been signed electronically. Number of Addenda: 0

## 2017-10-06 NOTE — Progress Notes (Signed)
Spoke with the wife today who is working with the patient to improve.  Followed some commands today.  Only prior abdominal surgery was lap chole.  For PEG later today.  Marta LamasJames O. Gae BonWyatt, III, MD, FACS [phone] Trauma Surgeon

## 2017-10-06 NOTE — Progress Notes (Addendum)
Occupational Therapy Treatment Patient Details Name: Dwayne [name] MRN: [id number] DOB: Feb 20, 1946 Today's Date: 10/06/2017    History of present illness 57 male s/p ICH L basal ganglia PMH: COPD, MI, CAD, HF   OT comments  Pt presents supine in bed, awake/alert initially though with increased fatigue/lethargy as session progressed given recent procedure from earlier today. Pt visually tracking during session though does not often do so on command or with intent/purpose. Pt demonstrates difficulty following one step commands, though intermittently engages with therapist/family members using facial expressions. Pt continues to require max-totalA for ADLs at this time. Completed additional PROM to RUE and repositioned in bed for comfort. Pt with very supportive family members present and engaged during session. Continue to recommend CIR level services at time of discharge. Will continue to follow acutely to progress pt towards established OT goals.   Follow Up Recommendations  CIR    Equipment Recommendations  3 in 1 bedside commode;Wheelchair (measurements OT);Wheelchair cushion (measurements OT);Hospital bed(hoyer lift )          Precautions / Restrictions Precautions Precautions: Fall Restrictions Weight Bearing Restrictions: No                                                                                           ADL either performed or assessed with clinical judgement   ADL Overall ADL's : Needs assistance/impaired                                       General ADL Comments: pt awake/alert initially, though with increased lethargy as session progressed due to procedure earlier today; completed PROM to RUE at bed level; family present and educating pt on safe movements to assist with moving RUE (PROM) while not in therapy; pt continues to require max-totalA for ADLs      Vision   Additional Comments: pt tracking  during session though does not do so on command or with inentional purpose; tends to maintain gaze L               Cognition Arousal/Alertness: Lethargic Behavior During Therapy: WFL for tasks assessed/performed Overall Cognitive Status: Difficult to assess Area of Impairment: Attention;Following commands;Problem solving                   Current Attention Level: Focused   Following Commands: Follows one step commands inconsistently       General Comments: pt continues to demonstrate limited ability to follow commands; though does intermittently appear to make facial expressions in response to therapist/family members         Exercises General Exercises - Upper Extremity Shoulder Flexion: PROM;10 reps;Right;Supine Shoulder Extension: PROM;Right;10 reps;Supine Shoulder Horizontal ABduction: PROM;10 reps;Right;Supine Shoulder Horizontal ADduction: PROM;10 reps;Right;Supine Elbow Flexion: PROM;10 reps;Right;Supine Elbow Extension: PROM;10 reps;Right;Supine Wrist Flexion: PROM;10 reps;Right;Supine Wrist Extension: PROM;10 reps;Right;Supine Digit Composite Flexion: PROM;10 reps;Right;Supine Composite Extension: PROM;10 reps;Right;Supine                Pertinent Vitals/ Pain       Pain Assessment: Faces Faces Pain Scale:  No hurt Pain Intervention(s): Monitored during session                                                          Frequency  Min 2X/week        Progress Toward Goals  OT Goals(current goals can now be found in the care plan section)  Progress towards OT goals: OT to reassess next treatment  Acute Rehab OT Goals Patient Stated Goal: none stated by patient and wife wanting to speak with someone regarding [employer] benefits OT Goal Formulation: With patient/family Time For Goal Achievement: 10/30/2017 Potential to Achieve Goals: Good  Plan Discharge plan remains appropriate                     AM-PAC PT "6  Clicks" Daily Activity     Outcome Measure   Help from another person eating meals?: Total Help from another person taking care of personal grooming?: Total Help from another person toileting, which includes using toliet, bedpan, or urinal?: Total Help from another person bathing (including washing, rinsing, drying)?: Total Help from another person to put on and taking off regular upper body clothing?: Total Help from another person to put on and taking off regular lower body clothing?: Total 6 Click Score: 6    End of Session Equipment Utilized During Treatment: Oxygen  OT Visit Diagnosis: Unsteadiness on feet (R26.81);Muscle weakness (generalized) (M62.81);Other symptoms and signs involving cognitive function;Hemiplegia and hemiparesis Hemiplegia - Right/Left: Right Hemiplegia - dominant/non-dominant: Dominant Hemiplegia - caused by: Nontraumatic intracerebral hemorrhage   Activity Tolerance Patient limited by lethargy   Patient Left in bed;with call bell/phone within reach;with family/visitor present;with bed alarm set   Nurse Communication Mobility status        Time: 1610-9604 OT Time Calculation (min): 22 min  Charges: OT General Charges $OT Visit: 1 Visit OT Treatments $Therapeutic Activity: 8-22 mins  Marcy Siren, OT Pager [phone] 10/06/2017    Orlando Penner 10/06/2017, 4:39 PM

## 2017-10-06 NOTE — Progress Notes (Signed)
OT Cancellation Note  Patient Details Name: Dyan Creelman MRN: [id number] DOB: 01-04-1946   Cancelled Treatment:    Reason Eval/Treat Not Completed: Patient at procedure or test/ unavailable (for PEG); will follow up as schedule permits.  Marcy Siren, OT Pager [phone] 10/06/2017   Orlando Penner 10/06/2017, 1:43 PM

## 2017-10-06 NOTE — Progress Notes (Signed)
STROKE TEAM PROGRESS NOTE   SUBJECTIVE (INTERVAL HISTORY) No family at bedside. Plan for PEG for feeding today by trauma team.  No  changes in exam.Awaiting bed on floor.  OBJECTIVE Temp:  [97.6 F (36.4 C)-99.4 F (37.4 C)] 98.6 F (37 C) (05/31 1354) Pulse Rate:  [63-80] 65 (05/31 1405) Cardiac Rhythm: Heart block (05/31 0000) Resp:  [16-26] 19 (05/31 1405) BP: (105-187)/(44-83) 125/53 (05/31 1405) SpO2:  [92 %-98 %] 92 % (05/31 1405) Weight:  [259 lb 4.2 oz (117.6 kg)] 259 lb 4.2 oz (117.6 kg) (05/31 0500)  CBC:  Recent Labs  Lab 10/05/17 0256 10/06/17 0326  WBC 13.4* 13.7*  NEUTROABS 9.1* 9.5*  HGB 13.7 14.3  HCT 40.6 43.2  MCV 96.2 97.7  PLT 301 307    Basic Metabolic Panel:  Recent Labs  Lab 10/01/17 1801  10/02/17 0505  10/04/17 0241 10/06/17 0326  NA  --    < > 139   < > 142 143  K  --    < > 3.8   < > 3.8 3.8  CL  --    < > 102   < > 105 108  CO2  --    < > 27   < > 27 27  GLUCOSE  --    < > 201*   < > 177* 299*  BUN  --    < > 25*   < > 27* 41*  CREATININE  --    < > 0.82   < > 0.71 0.80  CALCIUM  --    < > 9.2   < > 9.2 9.4  MG 1.8  --  1.9  --   --   --   PHOS 2.2*  --  3.7  --   --   --    < > = values in this interval not displayed.    PHYSICAL EXAM per Dr. Pearlean Brownie    Afebrile. Head is nontraumatic. Neck is supple without bruit.Cardiac exam no murmur or gallop. Lungs are clear to auscultation. Distal pulses are well felt. Neurological Exam :  Awake. Globally aphasic. Left gaze deviation. Unable to look to the right past midline. Does not blink to threat.  Right lower facial weakness. Does not follow any commands. Tongue midline. Cough and gag positive. Motor system exam reveals spontaneous antigravity and purposeful left upper movement, left arm action tremor.  Right upper extremity flicker  Withdraws bilateral lower extremity to pain but right  less than the left side. Right plantar upgoing left upgoing. Brisk reflexes.   ASSESSMENT/PLAN Mr.  Dwayne [name] is a 72 y.o. male with history of coronary artery disease with previous MI, COPD with continued tobacco use, diabetes mellitus, hyperlipidemia, hypertension, depression, and anxiety presenting with onset of right-sided weakness followed by unresponsiveness.  He did not receive IV t-PA due to ICH.  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 Stable.   CT Head F/U 5/25  Essentially stable  Ct Head Wo Contrast - 10/02/2017 -- Unchanged  CTA Head - No acute or focal vascular lesion to explain the ICH. Bilateral carotid disease.  Carotid Doppler - B 80-99% stenosis 2D Echo - Left ventricle: The cavity size was normal. Wall thickness was   increased in a pattern of moderate LVH. Systolic function was  normal. The estimated ejection fraction was in the range of 55%   to 60%. Wall motion was normal; there were no regional wall    motion abnormalities  LDL - 37  HgbA1c - 8.7  VTE prophylaxis - change to Lovenox 40 mg sq daily   aspirin 81 mg daily and clopidogrel 75 mg daily prior to admission, now on No antithrombotic due to bleed  Ongoing aggressive stroke risk factor management  Therapy recommendations:  CIR  Disposition:  Pending  Await transfer to the floor (2nd day)  Carotid Stenosis  Severe B ICA stenosis 80-99%  Given hmg, unable to treat with antiplatelets  Not a surgical candidate at this time  AVOID HYPOTENSION  Respiratory distress d/t hemorrhage, resolved  Extubated 528 with plans for no reintubation   Dysphagia, secondary to stroke  NPO. Failed swallow. For PEG placement. Trauma contacted.  Hypertensive Emergency  BP as high as 196/154 on arrival . Treated with IV Cardene, now off . SBP goal < 180  . up to 190s during the night . Was on coreg - resumed home meds of lasix, imdur  and lisinopril this am. Given severe ICA stenosis, will d/c lasix and lisinopril, keeping coreg and imdur.  . f/u BP tomorrow  Hyperlipidemia  Lipid lowering medication PTA:  Lipitor 80 mg daily  LDL 37, goal < 70  resume lower dose of Lipitor at time of discharge  Diabetes  HgbA1c 8.7, goal < 7.0  Uncontrolled  Glucoses elevated [phone]  Note recommendations for insulin - will hold until PEG placed tomorrow and TF restarted - NPO for PEG placement tonight  Other Stroke Risk Factors  Advanced age  Cigarette smoker will be advised to stop smoking  ETOH use, advised to drink no more than 1 alcoholic beverage per day.  Obesity, Body mass index is 37.2 kg/m., recommend weight loss, diet and exercise as appropriate  Coronary artery disease  Other Active Problems Bilateral carotid artery disease - check carotid Dopplers pending   Leukocytosis - 13.4 (temp 99.9) CXR stable.  Thought to be reactive in setting of hemorrhage.  Morphine allergy  Anxiety and depression history - consider resuming Zoloft  Hospital day # 7      I have personally examined this patient, reviewed notes, independently viewed imaging studies, participated in medical decision making and plan of care.ROS completed by me personally and pertinent positives fully documented  I have made any additions or clarifications directly to the above note.  . The patient remains globally aphasic with dense right hemiplegia and significant dysphagia. Family is agreeable to PEG tube to be done by  trauma team today. Transfer to the floor bed. Transfer to skilled nursing facility for rehabilitation early next week.This patient is critically ill and at significant risk of neurological worsening, death and care requires constant monitoring of vital signs, hemodynamics,respiratory and cardiac monitoring, extensive review of multiple databases, frequent neurological assessment, discussion with family, other specialists and  medical decision making of high complexity.I have made any additions or clarifications directly to the above note.This critical care time does not reflect procedure time, or teaching time or supervisory time of PA/NP/Med Resident etc but could involve care discussion time.  I spent 30 minutes of neurocritical care time  in the care of  this patient.      Delia Heady, MD Medical Director [facility] Pager: [phone] 10/06/2017 3:13 PM  To contact Stroke Continuity provider, please refer to [email or web address]. After hours, contact General Neurology

## 2017-10-06 NOTE — Anesthesia Procedure Notes (Signed)
Procedure Name: MAC Date/Time: 10/06/2017 1:32 PM Performed by: White, Amedeo Plenty, CRNA Pre-anesthesia Checklist: Patient identified, Emergency Drugs available, Suction available and Patient being monitored Patient Re-evaluated:Patient Re-evaluated prior to induction Oxygen Delivery Method: Nasal cannula

## 2017-10-06 NOTE — Care Management Important Message (Signed)
Important Message  Patient Details  Name: Dwayne [name] MRN: [id number] Date of Birth: 02-Feb-1946   Medicare Important Message Given:  Yes  Due to illness patient not able to sign.  Zahi Plaskett 10/06/2017, 2:58 PM

## 2017-10-06 NOTE — Anesthesia Postprocedure Evaluation (Signed)
Anesthesia Post Note  Patient: Dwayne [name]  Procedure(s) Performed: ESOPHAGOGASTRODUODENOSCOPY (EGD) WITH PROPOFOL (N/A ) PERCUTANEOUS ENDOSCOPIC GASTROSTOMY (PEG) PLACEMENT (N/A )     Patient location during evaluation: PACU Anesthesia Type: MAC Level of consciousness: awake Pain management: pain level controlled Vital Signs Assessment: post-procedure vital signs reviewed and stable Respiratory status: spontaneous breathing, nonlabored ventilation and respiratory function stable Cardiovascular status: stable and blood pressure returned to baseline Anesthetic complications: no    Last Vitals:  Vitals:   10/06/17 1355 10/06/17 1405  BP: (!) 105/44 (!) 125/53  Pulse: 68 65  Resp: (!) 21 19  Temp:    SpO2: 96% 92%    Last Pain:  Vitals:   10/06/17 1405  TempSrc:   PainSc: 0-No pain                 Audry Pili

## 2017-10-07 DIAGNOSIS — E1165 Type 2 diabetes mellitus with hyperglycemia: Secondary | ICD-10-CM

## 2017-10-07 DIAGNOSIS — R1312 Dysphagia, oropharyngeal phase: Secondary | ICD-10-CM

## 2017-10-07 DIAGNOSIS — E1159 Type 2 diabetes mellitus with other circulatory complications: Secondary | ICD-10-CM

## 2017-10-07 LAB — GLUCOSE, CAPILLARY
GLUCOSE-CAPILLARY: 324 mg/dL — AB (ref 65–99)
GLUCOSE-CAPILLARY: 261 mg/dL — AB (ref 65–99)
GLUCOSE-CAPILLARY: 299 mg/dL — AB (ref 65–99)
Glucose-Capillary: 283 mg/dL — ABNORMAL HIGH (ref 65–99)
Glucose-Capillary: 349 mg/dL — ABNORMAL HIGH (ref 65–99)
Glucose-Capillary: 350 mg/dL — ABNORMAL HIGH (ref 65–99)
Glucose-Capillary: 387 mg/dL — ABNORMAL HIGH (ref 65–99)

## 2017-10-07 LAB — CBC WITH DIFFERENTIAL/PLATELET
ABS IMMATURE GRANULOCYTES: 0.1 10*3/uL (ref 0.0–0.1)
Basophils Absolute: 0.1 10*3/uL (ref 0.0–0.1)
Basophils Relative: 1 %
Eosinophils Absolute: 0.1 10*3/uL (ref 0.0–0.7)
Eosinophils Relative: 1 %
HCT: 43.6 % (ref 39.0–52.0)
Hemoglobin: 14.5 g/dL (ref 13.0–17.0)
IMMATURE GRANULOCYTES: 1 %
LYMPHS PCT: 15 %
Lymphs Abs: 2.5 10*3/uL (ref 0.7–4.0)
MCH: 32.9 pg (ref 26.0–34.0)
MCHC: 33.3 g/dL (ref 30.0–36.0)
MCV: 98.9 fL (ref 78.0–100.0)
MONOS PCT: 11 %
Monocytes Absolute: 1.8 10*3/uL — ABNORMAL HIGH (ref 0.1–1.0)
NEUTROS ABS: 12.1 10*3/uL — AB (ref 1.7–7.7)
NEUTROS PCT: 73 %
Platelets: 317 10*3/uL (ref 150–400)
RBC: 4.41 MIL/uL (ref 4.22–5.81)
RDW: 13 % (ref 11.5–15.5)
WBC: 16.6 10*3/uL — ABNORMAL HIGH (ref 4.0–10.5)

## 2017-10-07 NOTE — Progress Notes (Signed)
STROKE TEAM PROGRESS NOTE   SUBJECTIVE (INTERVAL HISTORY) No family at bedside. Pt lying in bed, still has global aphasia. S/p PEG on tube feeding. Hyperglycemia after resume TF. Need to add novolog Q4h.   OBJECTIVE Temp:  [97.5 F (36.4 C)-100 F (37.8 C)] 99 F (37.2 C) (06/01 1144) Pulse Rate:  [49-80] 77 (06/01 1144) Cardiac Rhythm: Heart block (05/31 2000) Resp:  [17-20] 18 (06/01 1144) BP: (109-157)/(59-86) 109/59 (06/01 1144) SpO2:  [88 %-97 %] 88 % (06/01 1144) Weight:  [257 lb 0.9 oz (116.6 kg)] 257 lb 0.9 oz (116.6 kg) (06/01 0424)  CBC:  Recent Labs  Lab 10/06/17 0326 10/07/17 0333  WBC 13.7* 16.6*  NEUTROABS 9.5* 12.1*  HGB 14.3 14.5  HCT 43.2 43.6  MCV 97.7 98.9  PLT 307 317    Basic Metabolic Panel:  Recent Labs  Lab 10/01/17 1801  10/02/17 0505  10/04/17 0241 10/06/17 0326  NA  --    < > 139   < > 142 143  K  --    < > 3.8   < > 3.8 3.8  CL  --    < > 102   < > 105 108  CO2  --    < > 27   < > 27 27  GLUCOSE  --    < > 201*   < > 177* 299*  BUN  --    < > 25*   < > 27* 41*  CREATININE  --    < > 0.82   < > 0.71 0.80  CALCIUM  --    < > 9.2   < > 9.2 9.4  MG 1.8  --  1.9  --   --   --   PHOS 2.2*  --  3.7  --   --   --    < > = values in this interval not displayed.   IMAGING I have personally reviewed the radiological images below and agree with the radiology interpretations.  Ct Angio Head W Or Wo Contrast 09/29/2017 IMPRESSION:  1. No acute or focal vascular lesion to explain the intracranial hemorrhage.  2. Atherosclerotic changes in the cavernous internal carotid arteries bilaterally with moderate stenoses relative to the more distal ICA termini, right greater than left.  3. Asymmetric narrowing of the distal left ICA lumen below the skull base suggesting a significant proximal stenosis.  4. No significant change in the parenchymal hemorrhage or intraventricular component.   Ct Head Wo Contrast 10/02/2017 IMPRESSION:  Unchanged  examination of intraparenchymal hematoma centered in the left thalamus and basal ganglia with intraventricular extension.   Ct Head Wo Contrast 09/30/2017 IMPRESSION:  Essentially stable LEFT thalamic/basal ganglia hemorrhage with intraventricular extension. No increase in size, worsening shift, or hydrocephalus.   Ct Head Wo Contrast 09/29/2017 IMPRESSION:  1. Increase size of hematoma within the left basal ganglia. Stable intraventricular hemorrhage with minimal redistribution to the occipital horn of right lateral ventricle.  2. Mild increase in local mass effect. Stable 4 mm left-to-right midline shift.   Ct Head Code Stroke Wo Contrast 09/29/2017 IMPRESSION:  1. 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 Hemorrhage measures 2.5 x 4.2 x 2.0 cm (estimated volume is 88 cm^3).  2. Intraventricular extension of hemorrhage into the left lateral ventricle to the foramen of Monro. There is early dilation of the left lateral ventricle concerning for developing hydrocephalus.  3. 4 mm left right midline shift.   Transthoracic Echocardiogram 10/05/2017 Study  Conclusions - Left ventricle: The cavity size was normal. Wall thickness was   increased in a pattern of moderate LVH. Systolic function was   normal. The estimated ejection fraction was in the range of 55%   to 60%. Wall motion was normal; there were no regional wall   motion abnormalities. Doppler parameters are consistent with   abnormal left ventricular relaxation (grade 1 diastolic   dysfunction). - Left atrium: The atrium was moderately dilated. Impressions: - No cardiac source of embolism was identified, but cannot be ruled   out on the basis of this examination.  Bilateral Carotid Dopplers  10/05/2017 Final Interpretation: Right Carotid: Velocities in the right ICA are consistent with a 80-99% stenosis. Left Carotid: Velocities in the left ICA are consistent with a 80-99% stenosis. Vertebrals: Bilateral vertebral  arteries demonstrate antegrade flow.   PHYSICAL EXAM Vitals:   10/07/17 0424 10/07/17 0452 10/07/17 0827 10/07/17 1144  BP:  138/69 (!) 153/70 (!) 109/59  Pulse:  76 68 77  Resp:  18 17 18   Temp:  99.3 F (37.4 C) 100 F (37.8 C) 99 F (37.2 C)  TempSrc:  Oral Axillary Oral  SpO2:  93% 93% (!) 88%  Weight: 257 lb 0.9 oz (116.6 kg)     Height:        PHYSICAL EXAM per Dr. Pearlean [name]    Afebrile. Head is nontraumatic. Neck is supple without bruit.Cardiac exam no murmur or gallop. Lungs are clear to auscultation. Distal pulses are well felt. Neurological Exam :  Awake. Globally aphasic. Left gaze deviation. Unable to look to the right past midline. Does not blink to threat on the right.  Right lower facial weakness. Does not follow any commands. Tongue midline. Cough and gag positive. Motor system exam reveals spontaneous antigravity and purposeful left upper movement, left arm action tremor.  Right upper extremity flicker  Withdraws bilateral lower extremity to pain but right  less than the left side. Right plantar upgoing left downgoing. Brisk reflexes. Sensation, coordination and gait not tested.   ASSESSMENT/PLAN Dwayne [name] is a 72 y.o. male with history of coronary artery disease with previous MI, COPD with continued tobacco use, diabetes mellitus, hyperlipidemia, hypertension, depression, and anxiety presenting with onset of right-sided weakness followed by unresponsiveness.  He did not receive IV t-PA due to ICH.  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  Resultant  Right hemiparesis, left gaze, global aphasia  CT head - Acute left basal ganglia ICH Stable x 3  CTA Head - No acute or focal vascular lesion to explain the ICH. Bilateral cavernous carotid moderate stenosis R>L.  Carotid Doppler - Bilateral 80-99% stenosis  2D Echo -EF 55 to 60%.  No cardiac source of emboli identified  LDL - 37  HgbA1c - 8.7  VTE prophylaxis - change to  Lovenox 40 mg sq daily   aspirin 81 mg daily and clopidogrel 75 mg daily prior to admission, now on No antithrombotic due to ICH  Ongoing aggressive stroke risk factor management  Therapy recommendations:  CIR  Disposition:  Pending  Carotid Stenosis  Severe B ICA stenosis 80-99%  Given hmg, unable to treat with antiplatelets  Not a surgical candidate at this time  AVOID HYPOTENSION  Dysphagia, secondary to stroke  NPO. Failed swallow.   s/p PEG placement 10/06/17  Trauma following.  Hypertensive Emergency  BP stable now . Treated with IV Cardene, now off . SBP goal < 160  . On coreg 25  bid, imdur  Hyperlipidemia  Lipid lowering medication PTA:  Lipitor 80 mg daily  LDL 37, goal < 70  Hold off statin for now due to low level of LDL  Diabetes  HgbA1c 8.7, goal < 7.0  Uncontrolled  Glucoses elevated [id number]  On lantus 16u  novolog 5u Q4h  SSI  CBG monitoring  Leukocytosis  WBC 15.9->13.7->16.6   afebrile   CXR stable 10/04/17  UA pending  CXR repeat pending   Other Stroke Risk Factors  Advanced age  Cigarette smoker will be advised to stop smoking  ETOH use, advised to drink no more than 1 alcoholic beverage per day.  Obesity, Body mass index is 36.88 kg/m., recommend weight loss, diet and exercise as appropriate  Coronary artery disease  Other Active Problems  Anxiety and depression history - [facility]   H[facility] day # 8   Marvel PlanJindong Jonise Weightman, MD PhD Stroke Neurology 10/08/2017 3:20 AM   To contact Stroke Continuity provider, please refer to [email or web address]. After hours, contact General Neurology

## 2017-10-07 NOTE — Progress Notes (Signed)
1 Day Post-Op    CC:  Dysphagia  Subjective: He is in bed and wife is with him.  He is fairly sleepy this AM  Tube feeding is going and the site looks fine.    Objective: Vital signs in last 24 hours: Temp:  [97.5 F (36.4 C)-100 F (37.8 C)] 100 F (37.8 C) (06/01 0827) Pulse Rate:  [49-80] 68 (06/01 0827) Resp:  [17-21] 17 (06/01 0827) BP: (105-187)/(44-86) 153/70 (06/01 0827) SpO2:  [92 %-97 %] 93 % (06/01 0827) Weight:  [116.6 kg (257 lb 0.9 oz)] 116.6 kg (257 lb 0.9 oz) (06/01 0424) Last BM Date: 10/04/17 100 IV Urine x 1 recorded Stool x 1 recorded TM 100 VSS WBC 16.6  Intake/Output from previous day: 05/31 0701 - 06/01 0700 In: 100 [I.V.:100] Out: 0  Intake/Output this shift: No intake/output data recorded.  General appearance: Awake but sleepy GI: soft, non-tender; bowel sounds normal; no masses,  no organomegaly and PEG is being used for tube feedings, and the site looks fine.    Lab Results:  Recent Labs    10/06/17 0326 10/07/17 0333  WBC 13.7* 16.6*  HGB 14.3 14.5  HCT 43.2 43.6  PLT 307 317    BMET Recent Labs    10/06/17 0326  NA 143  K 3.8  CL 108  CO2 27  GLUCOSE 299*  BUN 41*  CREATININE 0.80  CALCIUM 9.4   PT/INR No results for input(s): LABPROT, INR in the last 72 hours.  No results for input(s): AST, ALT, ALKPHOS, BILITOT, PROT, ALBUMIN in the last 168 hours.   Lipase  No results found for: LIPASE   Medications: . carvedilol  25 mg Oral BID WC  . enoxaparin (LOVENOX) injection  40 mg Subcutaneous Q24H  . famotidine  20 mg Oral BID  . feeding supplement (PRO-STAT SUGAR FREE 64)  60 mL Per Tube BID  . insulin aspart  0-15 Units Subcutaneous Q4H  . insulin glargine  16 Units Subcutaneous QHS  . isosorbide mononitrate  10 mg Oral BID  . mouth rinse  15 mL Mouth Rinse 10 times per day  . senna-docusate  1 tablet Oral BID   . feeding supplement (JEVITY 1.2 CAL) Stopped (10/06/17 0001)   Anti-infectives (From admission,  onward)   None      Assessment/Plan Hypertension COPD Diabetes ID  Large L basal ganglia/thalamic ICH Dysphagia/Global aphasia Upper endoscopy and PEG placement, 10/06/17, Dr. Jimmye NormanJames Wyatt  FEN:  Tube feed ID:  No abx DVT:  Lovenox Follow up:  PRN Dr. Lindie SpruceWyatt   Plan:  From our standpoint the PEG is working and the site is fine.  We will see again as needed.  Keep the site clean and dry.  Call if we can help.           LOS: 8 days    Dorna Mallet 10/07/2017 [phone]

## 2017-10-08 ENCOUNTER — Inpatient Hospital Stay (HOSPITAL_COMMUNITY): Payer: Medicare Other

## 2017-10-08 ENCOUNTER — Encounter (HOSPITAL_COMMUNITY): Payer: Self-pay | Admitting: General Surgery

## 2017-10-08 DIAGNOSIS — I6523 Occlusion and stenosis of bilateral carotid arteries: Secondary | ICD-10-CM

## 2017-10-08 DIAGNOSIS — E785 Hyperlipidemia, unspecified: Secondary | ICD-10-CM

## 2017-10-08 LAB — URINALYSIS, COMPLETE (UACMP) WITH MICROSCOPIC
BACTERIA UA: NONE SEEN
Bilirubin Urine: NEGATIVE
GLUCOSE, UA: 50 mg/dL — AB
KETONES UR: NEGATIVE mg/dL
NITRITE: NEGATIVE
PH: 5 (ref 5.0–8.0)
Protein, ur: 30 mg/dL — AB
Specific Gravity, Urine: 1.024 (ref 1.005–1.030)

## 2017-10-08 LAB — GLUCOSE, CAPILLARY
GLUCOSE-CAPILLARY: 280 mg/dL — AB (ref 65–99)
GLUCOSE-CAPILLARY: 252 mg/dL — AB (ref 65–99)
Glucose-Capillary: 162 mg/dL — ABNORMAL HIGH (ref 65–99)
Glucose-Capillary: 311 mg/dL — ABNORMAL HIGH (ref 65–99)
Glucose-Capillary: 313 mg/dL — ABNORMAL HIGH (ref 65–99)

## 2017-10-08 LAB — CBC WITH DIFFERENTIAL/PLATELET
Abs Immature Granulocytes: 0.1 10*3/uL (ref 0.0–0.1)
BASOS PCT: 0 %
Basophils Absolute: 0.1 10*3/uL (ref 0.0–0.1)
EOS ABS: 0.1 10*3/uL (ref 0.0–0.7)
EOS PCT: 1 %
HCT: 44.9 % (ref 39.0–52.0)
Hemoglobin: 14.7 g/dL (ref 13.0–17.0)
IMMATURE GRANULOCYTES: 1 %
Lymphocytes Relative: 17 %
Lymphs Abs: 3.1 10*3/uL (ref 0.7–4.0)
MCH: 32.5 pg (ref 26.0–34.0)
MCHC: 32.7 g/dL (ref 30.0–36.0)
MCV: 99.1 fL (ref 78.0–100.0)
Monocytes Absolute: 1.9 10*3/uL — ABNORMAL HIGH (ref 0.1–1.0)
Monocytes Relative: 10 %
NEUTROS PCT: 71 %
Neutro Abs: 12.9 10*3/uL — ABNORMAL HIGH (ref 1.7–7.7)
PLATELETS: 321 10*3/uL (ref 150–400)
RBC: 4.53 MIL/uL (ref 4.22–5.81)
RDW: 13.2 % (ref 11.5–15.5)
WBC: 18.1 10*3/uL — AB (ref 4.0–10.5)

## 2017-10-08 MED ORDER — GLUCERNA 1.2 CAL PO LIQD
1000.0000 mL | ORAL | Status: DC
  Administered 2017-10-08 – 2017-10-12 (×6): 1000 mL
  Filled 2017-10-08 (×10): qty 1000

## 2017-10-08 MED ORDER — CARVEDILOL 12.5 MG PO TABS
12.5000 mg | ORAL_TABLET | Freq: Two times a day (BID) | ORAL | Status: DC
  Administered 2017-10-08 – 2017-10-12 (×8): 12.5 mg via ORAL
  Filled 2017-10-08 (×8): qty 1

## 2017-10-08 MED ORDER — GLIPIZIDE ER 10 MG PO TB24
10.0000 mg | ORAL_TABLET | Freq: Every day | ORAL | Status: DC
  Administered 2017-10-09 – 2017-10-13 (×5): 10 mg via ORAL
  Filled 2017-10-08 (×7): qty 1

## 2017-10-08 MED ORDER — SERTRALINE HCL 100 MG PO TABS
200.0000 mg | ORAL_TABLET | Freq: Every day | ORAL | Status: DC
  Administered 2017-10-08 – 2017-10-12 (×5): 200 mg via ORAL
  Filled 2017-10-08 (×5): qty 2

## 2017-10-08 MED ORDER — METFORMIN HCL 500 MG PO TABS
1000.0000 mg | ORAL_TABLET | Freq: Two times a day (BID) | ORAL | Status: DC
  Administered 2017-10-08 – 2017-10-13 (×10): 1000 mg via ORAL
  Filled 2017-10-08 (×11): qty 2

## 2017-10-08 MED ORDER — INSULIN ASPART 100 UNIT/ML ~~LOC~~ SOLN
5.0000 [IU] | SUBCUTANEOUS | Status: DC
  Administered 2017-10-08 – 2017-10-09 (×7): 5 [IU] via SUBCUTANEOUS

## 2017-10-08 NOTE — Progress Notes (Signed)
STROKE TEAM PROGRESS NOTE   SUBJECTIVE (INTERVAL HISTORY) No family at bedside. Pt lying in bed, still has global aphasia. S/p PEG on tube feeding. Continued to have hyperglycemia since resumed TF. Added novolog Q4h yesterday. Will change TF to glucerna 1.2 today.   OBJECTIVE Temp:  [97.7 F (36.5 C)-99.6 F (37.6 C)] 97.7 F (36.5 C) (06/02 0819) Pulse Rate:  [68-78] 76 (06/02 0819) Resp:  [18] 18 (06/02 0819) BP: (109-160)/(54-76) 160/76 (06/02 0819) SpO2:  [88 %-96 %] 92 % (06/02 0819)  CBC:  Recent Labs  Lab 10/07/17 0333 10/08/17 0541  WBC 16.6* 18.1*  NEUTROABS 12.1* 12.9*  HGB 14.5 14.7  HCT 43.6 44.9  MCV 98.9 99.1  PLT 317 321    Basic Metabolic Panel:  Recent Labs  Lab 10/01/17 1801  10/02/17 0505  10/04/17 0241 10/06/17 0326  NA  --    < > 139   < > 142 143  K  --    < > 3.8   < > 3.8 3.8  CL  --    < > 102   < > 105 108  CO2  --    < > 27   < > 27 27  GLUCOSE  --    < > 201*   < > 177* 299*  BUN  --    < > 25*   < > 27* 41*  CREATININE  --    < > 0.82   < > 0.71 0.80  CALCIUM  --    < > 9.2   < > 9.2 9.4  MG 1.8  --  1.9  --   --   --   PHOS 2.2*  --  3.7  --   --   --    < > = values in this interval not displayed.   IMAGING I have personally reviewed the radiological images below and agree with the radiology interpretations.   Portable CXR 1 View 10/08/2017 IMPRESSION: 1. No active disease.  No evidence of pneumonia or pulmonary edema. 2. Probable chronic interstitial lung disease.   Ct Angio Head W Or Wo Contrast 09/29/2017 IMPRESSION:  1. No acute or focal vascular lesion to explain the intracranial hemorrhage.  2. Atherosclerotic changes in the cavernous internal carotid arteries bilaterally with moderate stenoses relative to the more distal ICA termini, right greater than left.  3. Asymmetric narrowing of the distal left ICA lumen below the skull base suggesting a significant proximal stenosis.  4. No significant change in the parenchymal  hemorrhage or intraventricular component.   Ct Head Wo Contrast 10/02/2017 IMPRESSION:  Unchanged examination of intraparenchymal hematoma centered in the left thalamus and basal ganglia with intraventricular extension.   Ct Head Wo Contrast 09/30/2017 IMPRESSION:  Essentially stable LEFT thalamic/basal ganglia hemorrhage with intraventricular extension. No increase in size, worsening shift, or hydrocephalus.   Ct Head Wo Contrast 09/29/2017 IMPRESSION:  1. Increase size of hematoma within the left basal ganglia. Stable intraventricular hemorrhage with minimal redistribution to the occipital horn of right lateral ventricle.  2. Mild increase in local mass effect. Stable 4 mm left-to-right midline shift.   Ct Head Code Stroke Wo Contrast 09/29/2017 IMPRESSION:  1. 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 Hemorrhage measures 2.5 x 4.2 x 2.0 cm (estimated volume is 88 cm^3).  2. Intraventricular extension of hemorrhage into the left lateral ventricle to the foramen of Monro. There is early dilation of the left lateral ventricle concerning for developing hydrocephalus.  3. 4 mm left right midline shift.   Transthoracic Echocardiogram 10/05/2017 Study Conclusions - Left ventricle: The cavity size was normal. Wall thickness was   increased in a pattern of moderate LVH. Systolic function was   normal. The estimated ejection fraction was in the range of 55%   to 60%. Wall motion was normal; there were no regional wall   motion abnormalities. Doppler parameters are consistent with   abnormal left ventricular relaxation (grade 1 diastolic   dysfunction). - Left atrium: The atrium was moderately dilated. Impressions: - No cardiac source of embolism was identified, but cannot be ruled   out on the basis of this examination.  Bilateral Carotid Dopplers  10/05/2017 Final Interpretation: Right Carotid: Velocities in the right ICA are consistent with a 80-99% stenosis. Left Carotid:  Velocities in the left ICA are consistent with a 80-99% stenosis. Vertebrals: Bilateral vertebral arteries demonstrate antegrade flow.   PHYSICAL EXAM Vitals:   10/07/17 1958 10/07/17 2351 10/08/17 0402 10/08/17 0819  BP: 126/60 130/61 122/63 (!) 160/76  Pulse: 72 78 76 76  Resp: 18 18 18 18   Temp: 99.3 F (37.4 C) 99 F (37.2 C) 99.3 F (37.4 C) 97.7 F (36.5 C)  TempSrc: Oral Oral Oral Oral  SpO2: 95% 96% 91% 92%  Weight:      Height:        PHYSICAL EXAM per Dr. Pearlean Brownie    Afebrile. Head is nontraumatic. Neck is supple without bruit.Cardiac exam no murmur or gallop. Lungs are clear to auscultation. Distal pulses are well felt. Neurological Exam :  Awake. Globally aphasic. Left gaze deviation. Unable to look to the right past midline. Does not blink to threat on the right.  Right lower facial weakness. Does not follow any commands. Tongue midline. Cough and gag positive. Motor system exam reveals spontaneous antigravity and purposeful left upper movement, left arm action tremor.  Right upper extremity flicker  Withdraws bilateral lower extremity to pain but right  less than the left side. Right plantar upgoing left downgoing. Brisk reflexes. Sensation, coordination and gait not tested.  Exam unchanged from yesterday.    ASSESSMENT/PLAN [name] is a 72 y.o. male with history of coronary artery disease with previous MI, COPD with continued tobacco use, diabetes mellitus, hyperlipidemia, hypertension, depression, and anxiety presenting with onset of right-sided weakness followed by unresponsiveness.  He did not receive IV t-PA due to ICH.  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  Resultant  Right hemiparesis, left gaze, global aphasia  CT head - Acute left basal ganglia ICH Stable x 3  CTA Head - No acute or focal vascular lesion to explain the ICH. Bilateral cavernous carotid moderate stenosis R>L.  Carotid Doppler - Bilateral 80-99%  stenosis  2D Echo -EF 55 to 60%.  No cardiac source of emboli identified  LDL - 37  HgbA1c - 8.7  VTE prophylaxis - change to Lovenox 40 mg sq daily   aspirin 81 mg daily and clopidogrel 75 mg daily prior to admission, now on No antithrombotic due to ICH  Ongoing aggressive stroke risk factor management  Therapy recommendations:  CIR  Disposition:  Pending  Carotid Stenosis  Severe B ICA stenosis 80-99%  Given hmg, unable to treat with antiplatelets  Not a surgical candidate at this time  AVOID HYPOTENSION  Dysphagia, secondary to stroke  NPO. Failed swallow.   s/p PEG placement 10/06/17  Trauma following  Hypertensive Emergency  BP on the low side .  Off IV Cardene  . SBP goal 130-160 given b/l ICA high grade stenosis . Decrease coreg to 12.5 bid  Hyperlipidemia  Lipid lowering medication PTA:  Lipitor 80 mg daily PTA  LDL 37, goal < 70  Hold off statin for now due to low level of LDL  Diabetes  HgbA1c 8.7, goal < 7.0  Uncontrolled  Continue to have hyperglycemia    change TF to glucerna 10/08/17  On lantus 16u  novolog 5u Q4h  Resume home meds of glipizide and metformin  SSI  CBG monitoring  Leukocytosis  WBC 15.9->13.7->16.6 -> 18.1  afebrile  CXR stable 10/04/17  UA pending   CXR repeat 10/08/2017 negative.  Other Stroke Risk Factors  Advanced age  Cigarette smoker will be advised to stop smoking  ETOH use, advised to drink no more than 1 alcoholic beverage per day.  Obesity, Body mass index is 36.88 kg/m., recommend weight loss, diet and exercise as appropriate  Coronary artery disease  Other Active Problems  Anxiety and depression history - consider resuming [facility] day # 9  Marvel Plan, MD PhD Stroke Neurology 10/08/2017 3:56 PM     To contact Stroke Continuity provider, please refer to [email or web address]. After hours, contact General Neurology

## 2017-10-09 ENCOUNTER — Inpatient Hospital Stay (HOSPITAL_COMMUNITY): Payer: Medicare Other

## 2017-10-09 DIAGNOSIS — E119 Type 2 diabetes mellitus without complications: Secondary | ICD-10-CM

## 2017-10-09 DIAGNOSIS — N179 Acute kidney failure, unspecified: Secondary | ICD-10-CM

## 2017-10-09 DIAGNOSIS — I2510 Atherosclerotic heart disease of native coronary artery without angina pectoris: Secondary | ICD-10-CM

## 2017-10-09 DIAGNOSIS — M545 Low back pain: Secondary | ICD-10-CM

## 2017-10-09 DIAGNOSIS — Z794 Long term (current) use of insulin: Secondary | ICD-10-CM

## 2017-10-09 DIAGNOSIS — J9600 Acute respiratory failure, unspecified whether with hypoxia or hypercapnia: Secondary | ICD-10-CM | POA: Diagnosis not present

## 2017-10-09 DIAGNOSIS — I610 Nontraumatic intracerebral hemorrhage in hemisphere, subcortical: Principal | ICD-10-CM

## 2017-10-09 DIAGNOSIS — I69391 Dysphagia following cerebral infarction: Secondary | ICD-10-CM

## 2017-10-09 DIAGNOSIS — E669 Obesity, unspecified: Secondary | ICD-10-CM

## 2017-10-09 DIAGNOSIS — E1169 Type 2 diabetes mellitus with other specified complication: Secondary | ICD-10-CM

## 2017-10-09 DIAGNOSIS — G8929 Other chronic pain: Secondary | ICD-10-CM

## 2017-10-09 DIAGNOSIS — D72829 Elevated white blood cell count, unspecified: Secondary | ICD-10-CM

## 2017-10-09 DIAGNOSIS — I5189 Other ill-defined heart diseases: Secondary | ICD-10-CM

## 2017-10-09 DIAGNOSIS — Z931 Gastrostomy status: Secondary | ICD-10-CM

## 2017-10-09 DIAGNOSIS — I10 Essential (primary) hypertension: Secondary | ICD-10-CM

## 2017-10-09 DIAGNOSIS — J449 Chronic obstructive pulmonary disease, unspecified: Secondary | ICD-10-CM

## 2017-10-09 LAB — GLUCOSE, CAPILLARY
GLUCOSE-CAPILLARY: 264 mg/dL — AB (ref 65–99)
GLUCOSE-CAPILLARY: 255 mg/dL — AB (ref 65–99)
GLUCOSE-CAPILLARY: 165 mg/dL — AB (ref 65–99)
Glucose-Capillary: 258 mg/dL — ABNORMAL HIGH (ref 65–99)
Glucose-Capillary: 221 mg/dL — ABNORMAL HIGH (ref 65–99)
Glucose-Capillary: 187 mg/dL — ABNORMAL HIGH (ref 65–99)

## 2017-10-09 LAB — BASIC METABOLIC PANEL
Anion gap: 12 (ref 5–15)
BUN: 50 mg/dL — ABNORMAL HIGH (ref 6–20)
CHLORIDE: 109 mmol/L (ref 101–111)
CO2: 28 mmol/L (ref 22–32)
Calcium: 9.7 mg/dL (ref 8.9–10.3)
Creatinine, Ser: 1.09 mg/dL (ref 0.61–1.24)
GFR calc non Af Amer: >60 mL/min (ref >60)
Glucose, Bld: 271 mg/dL — ABNORMAL HIGH (ref 65–99)
POTASSIUM: 4.1 mmol/L (ref 3.5–5.1)
SODIUM: 149 mmol/L — AB (ref 135–145)

## 2017-10-09 LAB — CBC
HEMATOCRIT: 46.6 % (ref 39.0–52.0)
Hemoglobin: 15.1 g/dL (ref 13.0–17.0)
MCH: 32.6 pg (ref 26.0–34.0)
MCHC: 32.4 g/dL (ref 30.0–36.0)
MCV: 100.6 fL — AB (ref 78.0–100.0)
Platelets: 304 10*3/uL (ref 150–400)
RBC: 4.63 MIL/uL (ref 4.22–5.81)
RDW: 13.2 % (ref 11.5–15.5)
WBC: 23.3 10*3/uL — AB (ref 4.0–10.5)

## 2017-10-09 MED ORDER — INSULIN GLARGINE 100 UNIT/ML ~~LOC~~ SOLN
30.0000 [IU] | Freq: Every day | SUBCUTANEOUS | Status: DC
  Administered 2017-10-09 – 2017-10-12 (×4): 30 [IU] via SUBCUTANEOUS
  Filled 2017-10-09 (×5): qty 0.3

## 2017-10-09 MED ORDER — FREE WATER
200.0000 mL | Freq: Three times a day (TID) | Status: DC
  Administered 2017-10-09 – 2017-10-10 (×2): 200 mL

## 2017-10-09 MED ORDER — SODIUM CHLORIDE 0.9 % IV SOLN
2.0000 g | INTRAVENOUS | Status: DC
  Administered 2017-10-09 – 2017-10-10 (×2): 2 g via INTRAVENOUS
  Filled 2017-10-09 (×2): qty 20

## 2017-10-09 MED ORDER — INSULIN ASPART 100 UNIT/ML ~~LOC~~ SOLN
7.0000 [IU] | SUBCUTANEOUS | Status: DC
  Administered 2017-10-09 – 2017-10-12 (×17): 7 [IU] via SUBCUTANEOUS

## 2017-10-09 NOTE — Progress Notes (Signed)
  Speech Language Pathology Treatment: Dysphagia  Patient Details Name: Dwayne MaterDennis Awan MRN: [id number] DOB: 11/08/1945 Today's Date: 10/09/2017 Time: [id number] SLP Time Calculation (min) (ACUTE ONLY): 8 min  Assessment / Plan / Recommendation Clinical Impression  For the third consecutive SLP session pt is minimally responsive, unable to fully arouse. Pt briefly awakens with max stim, but does not respond to wet spoon in mouth, snoring respirations persist. No focused attention to any intervention attempted with SLP. Will f/u once more, possibly with co-tx session with PT or OT to see pt fully alert with mobility. Would not recommend CIR level therapies unless ability to participate in intensive therapy interventions improve.   HPI HPI: 72 year old man with a history of COPD, coronary artery disease (on aspirin and Plavix), diabetes, hypertension, hyperlipidemia.  He developed acute inability to stand, right-sided weakness on 5/24, was found to be severely hypertensive to the 200s with a suppressed sensorium.  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 Intubated on 5/24, one way extubation on 5/28.       SLP Plan  Continue with current plan of care       Recommendations  Diet recommendations: NPO                Follow up Recommendations: Skilled Nursing facility SLP Visit Diagnosis: Aphasia (R47.01) Plan: Continue with current plan of care       GO               [facility] Center LLCBonnie Rosenda Geffrard, MA CCC-SLP [phone]  Claudine MoutonDeBlois, Amore Grater Caroline 10/09/2017, 9:44 AM

## 2017-10-09 NOTE — Progress Notes (Signed)
Physical Therapy Treatment Patient Details Name: Dwayne [name] MRN: [id number] DOB: 03/04/1946 Today's Date: 10/09/2017    History of Present Illness [id number] male s/p ICH L basal ganglia PMH: COPD, MI, CAD, HF    PT Comments    Patient very difficult to arouse today - limited session to bed activities and transfer to EOB for hopeful increased arousal and participation with PT. Very limited assist from patient during rolling and supine to sit EOB where patient required Max A +2 to maintain upright positioning and forward gaze. Will plan to change discharge recommendation to SNF at this time due to patients limited ability to participate with skilled PT. Will plan to continue to follow acutely to maximize functional mobility.     Follow Up Recommendations  SNF;Supervision/Assistance - 24 hour     Equipment Recommendations  Other (comment)(TBD)    Recommendations for Other Services       Precautions / Restrictions Precautions Precautions: Fall Restrictions Weight Bearing Restrictions: No    Mobility  Bed Mobility Overal bed mobility: Needs Assistance Bed Mobility: Rolling;Supine to Sit;Sit to Supine Rolling: Total assist;+2 for physical assistance   Supine to sit: Total assist;+2 for physical assistance Sit to supine: Total assist;+2 for physical assistance   General bed mobility comments: Total assist wtih HOB elevated for supine to sit. Patient with no initiation or activatio to assist with mobility. Was able to hold onto hand rail during rolling for hygeine, but difficulty releasing to return to supine  Transfers                 General transfer comment: deferred due to lethargy  Ambulation/Gait                 Stairs             Wheelchair Mobility    Modified Rankin (Stroke Patients Only) Modified Rankin (Stroke Patients Only) Pre-Morbid Rankin Score: No symptoms Modified Rankin: Severe disability     Balance Overall balance assessment: Needs  assistance Sitting-balance support: Feet supported;Single extremity supported Sitting balance-Leahy Scale: Zero Sitting balance - Comments: required Max A +2 for upright sitting posture with heavy assist to elevate head                                     Cognition Arousal/Alertness: Lethargic Behavior During Therapy: WFL for tasks assessed/performed(limited arousal) Overall Cognitive Status: Difficult to assess Area of Impairment: Attention;Following commands;Problem solving                   Current Attention Level: Focused   Following Commands: Follows one step commands inconsistently     Problem Solving: Slow processing;Decreased initiation;Difficulty sequencing;Requires verbal cues;Requires tactile cues General Comments: Patient with limited arousal during session, difficulty following commands even when eyes open and more alert      Exercises      General Comments        Pertinent Vitals/Pain Pain Assessment: Faces Faces Pain Scale: No hurt    Home Living                      Prior Function            PT Goals (current goals can now be found in the care plan section) Acute Rehab PT Goals PT Goal Formulation: With family Time For Goal Achievement: 10/11/17 Potential to Achieve Goals: Fair Progress towards PT goals:  Progressing toward goals    Frequency    Min 3X/week      PT Plan Discharge plan needs to be updated;Frequency needs to be updated    Co-evaluation              AM-PAC PT "6 Clicks" Daily Activity  Outcome Measure  Difficulty turning over in bed (including adjusting bedclothes, sheets and blankets)?: Unable Difficulty moving from lying on back to sitting on the side of the bed? : Unable Difficulty sitting down on and standing up from a chair with arms (e.g., wheelchair, bedside commode, etc,.)?: Unable Help needed moving to and from a bed to chair (including a wheelchair)?: Total Help needed walking  in hospital room?: Total Help needed climbing 3-5 steps with a railing? : Total 6 Click Score: 6    End of Session Equipment Utilized During Treatment: Oxygen Activity Tolerance: Patient limited by fatigue;Patient limited by lethargy Patient left: in bed;with call bell/phone within reach;with bed alarm set;with family/visitor present;with SCD's reapplied Nurse Communication: Mobility status PT Visit Diagnosis: Other abnormalities of gait and mobility (R26.89);Other symptoms and signs involving the nervous system (R29.898);Hemiplegia and hemiparesis Hemiplegia - Right/Left: Right Hemiplegia - dominant/non-dominant: Dominant Hemiplegia - caused by: Cerebral infarction     Time: 1191-4782 PT Time Calculation (min) (ACUTE ONLY): 34 min  Charges:  $Therapeutic Activity: 23-37 mins                    G Codes:        Kipp Laurence, PT, DPT 10/09/17 5:07 PM

## 2017-10-09 NOTE — Consult Note (Signed)
Physical Medicine and Rehabilitation Consult Reason for Consult: Decreased functional mobility Referring Physician: Dr.Xu   HPI: Dwayne [name] is a 72 y.o. right-handed male with history of COPD, chronic back pain, hypertension, diabetes mellitus, CAD with stenting maintained on aspirin and Plavix.  Per chart review and wife, patient lives with spouse.  Independent using a cane and sometimes walker prior to admission.  Wife works 4 hours daily.  Presented 09/29/2017 with sudden onset of right-sided weakness and aphasia.  Systolic blood pressure in the 200s.  Cranial CT reviewed, showing left ICH.  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  Echocardiogram with ejection fraction of 60% grade 1 diastolic dysfunction.  Hospital course noted dysphagia placement of gastrostomy PEG tube 10/06/2017 per Dr. Lindie Spruce.  Subcutaneous Lovenox for DVT prophylaxis.  Physical therapy evaluation completed with recommendations of physical medicine rehab consult.   Review of Systems  Unable to perform ROS: Acuity of condition   Past Medical History:  Diagnosis Date  . Anxiety   . Aortic atherosclerosis (HCC) 08/14/2017  . Arthritis    "knees, elbows, hands" (02/12/2014)  . Compression fracture of L2 (HCC) 03/08/2013  . COPD (chronic obstructive pulmonary disease) (HCC)   . Coronary artery disease    Dr. Delton See, [facility]- follows  . Depression   . Esophageal reflux   . Gallstones   . Hyperlipidemia   . Hypertension, benign    Benign  . 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 Myocardial infarction (HCC) 1998 X 2   "probably a couple since 1998" (02/12/2014)  . Palpitations   . Pneumonia ~ 2004 X 1  . Swallowing difficulty    intermittent-occ"doesn't matter with solid or liquid"   Past Surgical History:  Procedure Laterality Date  . CARDIAC CATHETERIZATION  ~ 2012  . CATARACT EXTRACTION W/ INTRAOCULAR LENS IMPLANT  Right 10/2013  . CHOLECYSTECTOMY N/A 03/07/2016   Procedure: LAPAROSCOPIC CHOLECYSTECTOMY WITH INTRAOPERATIVE CHOLANGIOGRAM;  Surgeon: Glenna Fellows, MD;  Location: [facility] ORS;  Service: General;  Laterality: N/A;  . CORONARY ANGIOPLASTY  1998  . CORONARY ANGIOPLASTY WITH STENT PLACEMENT  1999; 2014; 02/12/2014   "1; 2; 1"  . ESOPHAGOGASTRODUODENOSCOPY (EGD) WITH PROPOFOL N/A 10/06/2017   Procedure: ESOPHAGOGASTRODUODENOSCOPY (EGD) WITH PROPOFOL;  Surgeon: Jimmye Norman, MD;  Location: [facility] ENDOSCOPY;  Service: General;  Laterality: N/A;  . LEFT HEART CATHETERIZATION WITH CORONARY ANGIOGRAM N/A 02/12/2014   Procedure: LEFT HEART CATHETERIZATION WITH CORONARY ANGIOGRAM;  Surgeon: Lesleigh Noe, MD;  Location: [facility] CATH LAB;  Service: Cardiovascular;  Laterality: N/A;  . PEG PLACEMENT N/A 10/06/2017   Procedure: PERCUTANEOUS ENDOSCOPIC GASTROSTOMY (PEG) PLACEMENT;  Surgeon: Jimmye Norman, MD;  Location: [facility] ENDOSCOPY;  Service: General;  Laterality: N/A;   Family History  Problem Relation Age of Onset  . Heart attack Father   . Diabetes Father   . Stroke Neg Hx    Social History:  reports that he has been smoking cigarettes.  He has a 25.00 pack-year smoking history. He has never used smokeless tobacco. He reports that he drinks alcohol. He reports that he does not use drugs. Allergies:  Allergies  Allergen Reactions  . Morphine And Related Anaphylaxis    Swelling of the tongue and face   Medications Prior to Admission  Medication Sig Dispense Refill  . acetaminophen (TYLENOL) 500 MG tablet Take 500 mg by mouth every 6 (six) hours as needed for mild  pain.    . albuterol (PROVENTIL HFA;VENTOLIN HFA) 108 (90 BASE) MCG/ACT inhaler Inhale 2 puffs into the lungs every 6 (six) hours as needed for wheezing.    Marland Kitchen aspirin 81 MG chewable tablet Chew 1 tablet (81 mg total) by mouth daily.    Marland Kitchen atorvastatin (LIPITOR) 80 MG tablet Take 80 mg by mouth daily at 6 PM.     . clonazePAM (KLONOPIN) 0.5 MG tablet  Take 0.5 mg by mouth daily.     . clopidogrel (PLAVIX) 75 MG tablet Take 1 tablet (75 mg total) by mouth daily. 90 tablet 3  . furosemide (LASIX) 20 MG tablet Take 1 tablet (20 mg total) by mouth every other day. 45 tablet 3  . GLIPIZIDE XL 10 MG 24 hr tablet Take 10 mg by mouth daily.     . hydrochlorothiazide (HYDRODIURIL) 25 MG tablet Take 25 mg by mouth every other day.     . isosorbide mononitrate (IMDUR) 30 MG 24 hr tablet Take 1 tablet (30 mg total) by mouth daily. 90 tablet 3  . lisinopril (PRINIVIL,ZESTRIL) 40 MG tablet Take 40 mg by mouth daily.    . metFORMIN (GLUCOPHAGE) 1000 MG tablet Take 1 tablet (1,000 mg total) by mouth 2 (two) times daily with a meal.    . nitroGLYCERIN (NITROSTAT) 0.4 MG SL tablet Place 0.4 mg under the tongue every 5 (five) minutes as needed for chest pain (x 3 doses).    . pantoprazole (PROTONIX) 40 MG tablet Take 1 tablet (40 mg total) by mouth daily. 30 tablet 0  . Probiotic Product (PROBIOTIC PO) Take 1 tablet by mouth daily.    . sertraline (ZOLOFT) 100 MG tablet Take 200 mg by mouth at bedtime.       Home: Home Living Family/patient expects to be discharged to:: Inpatient rehab Living Arrangements: Spouse/significant other Available Help at Discharge: Family, Available PRN/intermittently, Available 24 hours/day Type of Home: House Home Access: Stairs to enter Additional Comments: wife reports that daughter is within the home to help   Functional History: Prior Function Level of Independence: Independent with assistive device(s) Comments: was using cane and walker but was able to complete all ADLs and mobility modified indep Functional Status:  Mobility: Bed Mobility Overal bed mobility: Needs Assistance Bed Mobility: Rolling Rolling: +2 for physical assistance, Total assist Supine to sit: Total assist, +2 for physical assistance Sit to supine: Total assist, +2 for physical assistance, +2 for safety/equipment General bed mobility comments:  assist for placing lift pad under pt Transfers Overall transfer level: Needs assistance Equipment used: (2 person lift with gait belt and bed pad) Transfer via Lift Equipment: [place] Transfers: Sit to/from Stand Sit to Stand: Total assist General transfer comment: lifted to recliner with maxi sky lift Ambulation/Gait General Gait Details: unable    ADL: ADL Overall ADL's : Needs assistance/impaired Eating/Feeding: NPO Grooming: Maximal assistance, Sitting Grooming Details (indicate cue type and reason): hand over hand with LOB with L UE used with posterior lean. Pt total (A) to reposition to sitting Upper Body Bathing: Total assistance Lower Body Bathing: Total assistance Upper Body Dressing : Total assistance Lower Body Dressing: Total assistance Toilet Transfer Details (indicate cue type and reason): na General ADL Comments: pt awake/alert initially, though with increased lethargy as session progressed due to procedure earlier today; completed PROM to RUE at bed level; family present and educating pt on safe movements to assist with moving RUE (PROM) while not in therapy; pt continues to require max-totalA for ADLs  Cognition: Cognition Overall Cognitive Status: Difficult to assess Arousal/Alertness: Lethargic Orientation Level: Other (comment)(unable to assess, pt non verbal) Attention: Focused Focused Attention: Impaired Focused Attention Impairment: Verbal basic, Functional basic Cognition Arousal/Alertness: Lethargic Behavior During Therapy: WFL for tasks assessed/performed Overall Cognitive Status: Difficult to assess Area of Impairment: Attention, Following commands, Problem solving Current Attention Level: Focused Following Commands: Follows one step commands inconsistently Problem Solving: Slow processing, Decreased initiation, Difficulty sequencing, Requires verbal cues, Requires tactile cues(pt demo'd withdrawl to pain on R UE and LE ) General Comments: pt  continues to demonstrate limited ability to follow commands; though does intermittently appear to make facial expressions in response to therapist/family members  Difficult to assess due to: Impaired communication  Blood pressure 119/81, pulse 86, temperature 98.9 F (37.2 C), temperature source Oral, resp. rate 20, height 5\' 10"  (1.778 m), weight 123.1 kg (271 lb 6.2 oz), SpO2 94 %. Physical Exam  Vitals reviewed. Constitutional: He appears well-developed.  Obese  HENT:  Head: Normocephalic and atraumatic.  Eyes: Right eye exhibits no discharge. Left eye exhibits no discharge.  Pupils sluggish but reactive to light  Neck: Normal range of motion. Neck supple.  Cardiovascular: Normal rate and regular rhythm.  Respiratory:  Limited inspiratory effort +Porter  GI: Soft. Bowel sounds are normal. He exhibits no distension.  PEG tube in place  Musculoskeletal:  LE edema  Neurological:  Patient extremely somnolent and difficult to arouse  Was not able to participate in physical exam  Skin: Skin is warm and dry.  Psychiatric:  Unable to assess due to lethargy    Results for orders placed or performed during the hospital encounter of 09/29/17 (from the past 24 hour(s))  Glucose, capillary     Status: Abnormal   Collection Time: 10/08/17  4:52 PM  Result Value Ref Range   Glucose-Capillary 264 (H) 65 - 99 mg/dL   Comment 1 Notify RN    Comment 2 Document in Chart   Urinalysis, Complete w Microscopic     Status: Abnormal   Collection Time: 10/08/17  5:11 PM  Result Value Ref Range   Color, Urine YELLOW YELLOW   APPearance HAZY (A) CLEAR   Specific Gravity, Urine 1.024 1.005 - 1.030   pH 5.0 5.0 - 8.0   Glucose, UA 50 (A) NEGATIVE mg/dL   Hgb urine dipstick SMALL (A) NEGATIVE   Bilirubin Urine NEGATIVE NEGATIVE   Ketones, ur NEGATIVE NEGATIVE mg/dL   Protein, ur 30 (A) NEGATIVE mg/dL   Nitrite NEGATIVE NEGATIVE   Leukocytes, UA TRACE (A) NEGATIVE   RBC / HPF 0-5 0 - 5 RBC/hpf    WBC, UA 6-10 0 - 5 WBC/hpf   Bacteria, UA NONE SEEN NONE SEEN   Mucus PRESENT   Glucose, capillary     Status: Abnormal   Collection Time: 10/08/17  8:13 PM  Result Value Ref Range   Glucose-Capillary 252 (H) 65 - 99 mg/dL  Glucose, capillary     Status: Abnormal   Collection Time: 10/08/17 11:40 PM  Result Value Ref Range   Glucose-Capillary 162 (H) 65 - 99 mg/dL  Glucose, capillary     Status: Abnormal   Collection Time: 10/09/17  3:25 AM  Result Value Ref Range   Glucose-Capillary 258 (H) 65 - 99 mg/dL   Comment 1 Notify RN    Comment 2 Document in Chart   Basic metabolic panel     Status: Abnormal   Collection Time: 10/09/17  6:12 AM  Result Value Ref Range  Sodium 149 (H) 135 - 145 mmol/L   Potassium 4.1 3.5 - 5.1 mmol/L   Chloride 109 101 - 111 mmol/L   CO2 28 22 - 32 mmol/L   Glucose, Bld 271 (H) 65 - 99 mg/dL   BUN 50 (H) 6 - 20 mg/dL   Creatinine, Ser 8.651.09 0.61 - 1.24 mg/dL   Calcium 9.7 8.9 - 78.410.3 mg/dL   GFR calc non Af Amer >60 >60 mL/min   GFR calc Af Amer >60 >60 mL/min   Anion gap 12 5 - 15  CBC     Status: Abnormal   Collection Time: 10/09/17  6:12 AM  Result Value Ref Range   WBC 23.3 (H) 4.0 - 10.5 K/uL   RBC 4.63 4.22 - 5.81 MIL/uL   Hemoglobin 15.1 13.0 - 17.0 g/dL   HCT 69.646.6 29.539.0 - 28.452.0 %   MCV 100.6 (H) 78.0 - 100.0 fL   MCH 32.6 26.0 - 34.0 pg   MCHC 32.4 30.0 - 36.0 g/dL   RDW 13.213.2 44.011.5 - 10.215.5 %   Platelets 304 150 - 400 K/uL  Glucose, capillary     Status: Abnormal   Collection Time: 10/09/17  8:44 AM  Result Value Ref Range   Glucose-Capillary 255 (H) 65 - 99 mg/dL   Comment 1 Notify RN    Comment 2 Document in Chart   Glucose, capillary     Status: Abnormal   Collection Time: 10/09/17 12:19 PM  Result Value Ref Range   Glucose-Capillary 221 (H) 65 - 99 mg/dL   Comment 1 Notify RN    Comment 2 Document in Chart    Dg Chest Port 1 View  Result Date: 10/08/2017 CLINICAL DATA:  Leukocytosis; hx of PNA, HTN, and COPD EXAM: PORTABLE CHEST  1 VIEW COMPARISON:  Chest x-rays dated 10/04/2017, 09/29/2017 and 07/14/2016. FINDINGS: Heart size and mediastinal contours are stable, within normal limits. Coarse lung markings again noted bilaterally suggesting chronic interstitial lung disease. No new lung findings. No pleural effusion or pneumothorax seen. No acute or suspicious osseous finding. IMPRESSION: 1. No active disease.  No evidence of pneumonia or pulmonary edema. 2. Probable chronic interstitial lung disease. Electronically Signed   By: Bary RichardStan  Maynard M.D.   On: 10/08/2017 08:18    Assessment/Plan: Diagnosis: Left basal ganglia and thalamic ICH Labs and images independently reviewed.  Records reviewed and summated above. Stroke: Continue secondary stroke prophylaxis and Risk Factor Modification listed below:   Blood Pressure Management:  Continue current medication with prn's with permisive HTN per primary team Diabetes management:   Tobacco abuse:   ?Right sided hemiparesis: fit for orthosis to prevent contractures (resting hand splint for day, wrist cock up splint at night, PRAFO, etc) Motor recovery: Fluoxetine  1. Does the need for close, 24 hr/day medical supervision in concert with the patient's rehab needs make it unreasonable for this patient to be served in a less intensive setting? Yes  2. Co-Morbidities requiring supervision/potential complications: dysphagia (status post PEG, advance diet as dysfunction tolerated), diastolic dysfunction (monitor for signs and symptoms of fluid overload), COPD (Wean supplemental oxygen as tolerated, monitor respiratory rate and O2 sats with increased physical exertion), chronic back pain (Biofeedback training with therapies to help reduce reliance on opiate pain medications, monitor pain control during therapies, and sedation at rest and titrate to maximum efficacy to ensure participation and gains in therapies), HTN (monitor and provide prns in accordance with increased physical exertion and  pain), diabetes mellitus (Monitor in accordance with  exercise and adjust meds as necessary), CAD (continue meds), AKI (avoid nephrotoxic meds), leukocytosis (cont to monitor for signs and symptoms of infection, further workup if indicated) 3. Due to bladder management, bowel management, safety, skin/wound care, disease management, medication administration, pain management and patient education, does the patient require 24 hr/day rehab nursing? Yes 4. Does the patient require coordinated care of a physician, rehab nurse, PT (1-2 hrs/day, 5 days/week), OT (1-2 hrs/day, 5 days/week) and SLP (1-2 hrs/day, 5 days/week) to address physical and functional deficits in the context of the above medical diagnosis(es)? Yes Addressing deficits in the following areas: balance, endurance, locomotion, strength, transferring, bowel/bladder control, bathing, dressing, feeding, grooming, toileting, cognition, speech, language, swallowing and psychosocial support 5. Can the patient actively participate in an intensive therapy program of at least 3 hrs of therapy per day at least 5 days per week? No 6. The potential for patient to make measurable gains while on inpatient rehab is TBD 7. Anticipated functional outcomes upon discharge from inpatient rehab are TBD  with PT, TBD with OT, TBD with SLP. 8. Estimated rehab length of stay to reach the above functional goals is: TBD. 9. Anticipated D/C setting: TBD 10. Anticipated post D/C treatments: TBD 11. Overall Rehab/Functional Prognosis: TBD  RECOMMENDATIONS: This patient's condition is appropriate for continued rehabilitative care in the following setting: Will consider CIR if patient is able to meaningfully participate in therapies and has the potential to make functional progress.  At this time recommend SNF with outpatient PM&R follow-up. Patient has agreed to participate in recommended program. Potentially Note that insurance prior authorization may be required for  reimbursement for recommended care.  Comment: Rehab Admissions Coordinator to follow up.   I have personally performed a face to face diagnostic evaluation, including, but not limited to relevant history and physical exam findings, of this patient and developed relevant assessment and plan.  Additionally, I have reviewed and concur with the physician assistant's documentation above.   Maryla Morrow, MD, ABPMR Mcarthur Rossetti Angiulli, PA-C 10/09/2017

## 2017-10-09 NOTE — Progress Notes (Addendum)
STROKE TEAM PROGRESS NOTE   SUBJECTIVE (INTERVAL HISTORY) No family at bedside. Pt lying in bed, still has global aphasia. S/p PEG on tube feeding. Still with hyperglycemia on glucerna. Insulin adjusted by Dr. Roda Shutters this am.  OBJECTIVE Temp:  [98 F (36.7 C)-99.5 F (37.5 C)] 98.9 F (37.2 C) (06/03 1205) Pulse Rate:  [77-89] 86 (06/03 1205) Resp:  [16-20] 20 (06/03 1205) BP: (117-141)/(60-81) 119/81 (06/03 1205) SpO2:  [92 %-94 %] 94 % (06/03 1205) Weight:  [123.1 kg (271 lb 6.2 oz)] 123.1 kg (271 lb 6.2 oz) (06/03 0331)  CBC:  Recent Labs  Lab 10/07/17 0333 10/08/17 0541 10/09/17 0612  WBC 16.6* 18.1* 23.3*  NEUTROABS 12.1* 12.9*  --   HGB 14.5 14.7 15.1  HCT 43.6 44.9 46.6  MCV 98.9 99.1 100.6*  PLT 317 321 304    Basic Metabolic Panel:  Recent Labs  Lab 10/06/17 0326 10/09/17 0612  NA 143 149*  K 3.8 4.1  CL 108 109  CO2 27 28  GLUCOSE 299* 271*  BUN 41* 50*  CREATININE 0.80 1.09  CALCIUM 9.4 9.7   IMAGING Portable CXR 1 View 10/08/2017 IMPRESSION: 1. No active disease.  No evidence of pneumonia or pulmonary edema. 2. Probable chronic interstitial lung disease.  Ct Angio Head W Or Wo Contrast 09/29/2017 IMPRESSION:  1. No acute or focal vascular lesion to explain the intracranial hemorrhage.  2. Atherosclerotic changes in the cavernous internal carotid arteries bilaterally with moderate stenoses relative to the more distal ICA termini, right greater than left.  3. Asymmetric narrowing of the distal left ICA lumen below the skull base suggesting a significant proximal stenosis.  4. No significant change in the parenchymal hemorrhage or intraventricular component.   Ct Head Wo Contrast 10/02/2017 IMPRESSION:  Unchanged examination of intraparenchymal hematoma centered in the left thalamus and basal ganglia with intraventricular extension.   Ct Head Wo Contrast 09/30/2017 IMPRESSION:  Essentially stable LEFT thalamic/basal ganglia hemorrhage with  intraventricular extension. No increase in size, worsening shift, or hydrocephalus.   Ct Head Wo Contrast 09/29/2017 IMPRESSION:  1. Increase size of hematoma within the left basal ganglia. Stable intraventricular hemorrhage with minimal redistribution to the occipital horn of right lateral ventricle.  2. Mild increase in local mass effect. Stable 4 mm left-to-right midline shift.   Ct Head Code Stroke Wo Contrast 09/29/2017 IMPRESSION:  1. 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 Hemorrhage measures 2.5 x 4.2 x 2.0 cm (estimated volume is 88 cm^3).  2. Intraventricular extension of hemorrhage into the left lateral ventricle to the foramen of Monro. There is early dilation of the left lateral ventricle concerning for developing hydrocephalus.  3. 4 mm left right midline shift.   Transthoracic Echocardiogram 10/05/2017 Study Conclusions - Left ventricle: The cavity size was normal. Wall thickness was increased in a pattern of moderate LVH. Systolic function was normal. The estimated ejection fraction was in the range of 55% to 60%. Wall motion was normal; there were no regional wall motion abnormalities. Doppler parameters are consistent with abnormal left ventricular relaxation (grade 1 diastolic dysfunction). - Left atrium: The atrium was moderately dilated. Impressions:   No cardiac source of embolism was identified, but cannot be ruled out on the basis of this examination.  Bilateral Carotid Dopplers  10/05/2017 Final Interpretation: Right Carotid: Velocities in the right ICA are consistent with a 80-99% stenosis. Left Carotid: Velocities in the left ICA are consistent with a 80-99% stenosis. Vertebrals: Bilateral vertebral arteries demonstrate antegrade flow.  PHYSICAL EXAM Afebrile. Head is nontraumatic. Neck is supple without bruit.Cardiac exam no murmur or gallop. Lungs are clear to auscultation. Distal pulses are well felt. Neurological Exam : Lethargic. Awakes with  effort. Globally aphasic. Left gaze deviation. Unable to look to the right past midline. Does not blink to threat on the right.  Right lower facial weakness. Does not follow any commands. Tongue midline. Cough and gag positive. Motor system exam reveals spontaneous antigravity and purposeful left upper movement, left arm action tremor.  Right upper extremity flicker  Withdraws bilateral lower extremity to pain but right  less than the left side. Right plantar upgoing left downgoing. Brisk reflexes. Sensation, coordination and gait not tested.   ASSESSMENT/PLAN [name] is a 72 y.o. male with history of coronary artery disease with previous MI, COPD with continued tobacco use, diabetes mellitus, hyperlipidemia, hypertension, depression, and anxiety presenting with onset of right-sided weakness followed by unresponsiveness.  He did not receive IV t-PA due to ICH.  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  Resultant  Right hemiparesis, left gaze, global aphasia  CT head - Acute left basal ganglia ICH Stable x 3  CTA Head - No acute or focal vascular lesion to explain the ICH. Bilateral cavernous carotid moderate stenosis R>L.  Carotid Doppler - Bilateral 80-99% stenosis  2D Echo -EF 55 to 60%.  No cardiac source of emboli identified  LDL - 37  HgbA1c - 8.7  VTE prophylaxis - change to Lovenox 40 mg sq daily   aspirin 81 mg daily and clopidogrel 75 mg daily prior to admission, now on No antithrombotic due to ICH  Ongoing aggressive stroke risk factor management  Therapy recommendations:  CIR  Disposition:  Pending. Medically ready for transfer when bed available.  Carotid Stenosis  Severe B ICA stenosis 80-99%  Given hmg, unable to treat with antiplatelets  Not a surgical candidate at this time  AVOID HYPOTENSION  Dysphagia, secondary to stroke  NPO. Failed swallow.   s/p PEG placement 10/06/17  Trauma following  Hypertensive  Emergency . Treated initially with IV Cardene  . BP now 110-140s . SBP goal 130-160 given b/l ICA high grade stenosis . Remains on coreg to 12.5 bid, isosorbide mononitrate 10 bid  Hyperlipidemia  Lipid lowering medication PTA:  Lipitor 80 mg daily PTA  LDL 37, goal < 70  Hold off statin for now due to low level of LDL  Diabetes  HgbA1c 8.7, goal < 7.0  Uncontrolled  Continue to have hyperglycemia    Now on glucerna 10/08/17  Received 54 u insulin past 24h -> increased lantus 30u  Increased novolog 7u Q4h  On home meds of glipizide and metformin 6/2  SSI  CBG monitoring  Leukocytosis  WBC 15.9->13.7->16.6 -> 18.1->23.3  afebrile  CXR stable 10/04/17. CXR repeat 10/08/2017 negative.  UA trace leukocytes, no bacteria, WBC 0-5  PEG site unremarkable  Other Stroke Risk Factors  Advanced age  Cigarette smoker will be advised to stop smoking  ETOH use, advised to drink no more than 1 alcoholic beverage per day.  Obesity, Body mass index is 38.94 kg/m., recommend weight loss, diet and exercise as appropriate  Coronary artery disease  Other Active Problems  Anxiety and depression history - consider resuming Zoloft  Hospital day # 10  Annie Main, MSN, APRN, ANVP-BC, AGPCNP-BC Advanced Practice Stroke Nurse [facility] See Amion for Schedule & Pager information 10/09/2017 2:41 PM   ATTENDING NOTE: I  reviewed above note and agree with the assessment and plan. I have made any additions or clarifications directly to the above note. Pt was seen and examined.   Patient seems more drowsy sleepy today, still able to arouse with voice, but back to sleep shortly. Still move left UE and LE spontaneously. Not sure whether patient did not get good sleep last night.  Repeat UA showed WBC 6-10, CXR yesterday unremarkable.  No fever, however, WBC trending up to 23.3 today.  No clear infection source.  Will do urine culture, and empiric treatment with Rocephin.   Will check CT head to rule out hydrocephalus, and repeat chest x-ray to rule out aspiration pneumonia.  Sodium 149, continue tube feeding and add free water every 8 hours. Increased lantus and novolog dose and resumed po DM meds for hyperglycemia coverage. CBG monitoring improved. Repeat CBC BMP tomorrow morning.  PT/OT recommend CR Friday, will order inpatient rehab consultation.   Marvel PlanJindong Jonette Wassel, MD PhD Stroke Neurology 10/09/2017 4:07 PM  I spent  35 minutes in total face-to-face time with the patient, more than 50% of which was spent in counseling and coordination of care, reviewing test results, images and medication, and discussing the diagnosis of lethargy, ICH with IVH, b/l carotid stenosis, leukocytosis, treatment plan and potential prognosis. This patient's care requiresreview of multiple databases, neurological assessment, discussion with family, other specialists and medical decision making of high complexity.       To contact Stroke Continuity provider, please refer to [email or web address]. After hours, contact General Neurology

## 2017-10-09 NOTE — NC FL2 (Signed)
[place] MEDICAID FL2 LEVEL OF CARE SCREENING TOOL     IDENTIFICATION  Patient Name: Dwayne MaterDennis Criswell Birthdate: 11/16/1945 Sex: male Admission Date (Current Location): 09/29/2017  [facility] [place]   [facility] and Address:  [facility], [address], [place], [place] [address]      Provider Number: [id number]  Attending Physician Name and Address:  Marvel PlanXu, Jindong, MD  Relative Name and Phone Number:       Current Level of Care: Hospital Recommended Level of Care: Skilled Nursing Facility Prior Approval Number:    Date Approved/Denied:   PASRR Number: Manual review  Discharge Plan: SNF    Current Diagnoses: Patient Active Problem List   Diagnosis Date Noted  . Acute respiratory failure (HCC)   . Leukocytosis   . Dysphagia, post-stroke   . Status post insertion of percutaneous endoscopic gastrostomy (PEG) tube (HCC)   . Chronic obstructive pulmonary disease (HCC)   . Diastolic dysfunction   . Chronic bilateral low back pain   . Diabetes mellitus type 2 in obese (HCC)   . AKI (acute kidney injury) (HCC)   . ICH (intracerebral hemorrhage) (HCC) 09/29/2017  . Aortic atherosclerosis (HCC) 08/14/2017  . Chronic diastolic CHF (congestive heart failure) (HCC) 10/05/2016  . [place] I 10/05/2016  . Dizziness 07/14/2016  . Symptomatic bradycardia 07/14/2016  . Cholelithiasis and cholecystitis without obstruction 03/07/2016  . Diabetic neuropathy (HCC) 07/30/2014  . Adjustment disorder with depressed mood 02/24/2014  . Acid reflux 02/24/2014  . Diabetes mellitus, type 2 (HCC) 02/24/2014  . 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 Abnormal nuclear stress test 02/12/2014  . Hx of class III angina pectoris 02/12/2014  . History of class III angina pectoris 02/12/2014  . Lumbar vertebral fracture (HCC) 03/07/2013  . Angioedema 03/07/2013  . Depressive disorder, not elsewhere  classified 02/25/2013  . Coronary artery disease involving native coronary artery of native heart without angina pectoris 01/30/2013  . Essential hypertension 01/30/2013  . IDDM (insulin dependent diabetes mellitus) (HCC) 01/30/2013  . Medication refill 01/30/2013  . Chest pain 08/03/2012  . Other reasons for seeking consultation 04/01/2011  . Anxiety 07/27/2010  . History of myocardial infarction 06/29/2010  . Presence of stent in coronary artery 06/29/2010  . Advance directive discussed with patient 06/04/2010  . Blood in the urine 01/17/2006  . HLD (hyperlipidemia) 01/17/2006  . Awareness of heartbeats 01/17/2006    Orientation RESPIRATION BLADDER Height & Weight     (unable to assess)  O2([place] 1L) Incontinent Weight: 271 lb 6.2 oz (123.1 kg) Height:  5\' 10"  (177.8 cm)  BEHAVIORAL SYMPTOMS/MOOD NEUROLOGICAL BOWEL NUTRITION STATUS      Incontinent Diet, Feeding tube(peg tube)  AMBULATORY STATUS COMMUNICATION OF NEEDS Skin   Extensive Assist Non-Verbally Normal                       Personal Care Assistance Level of Assistance  Bathing, Feeding, Dressing Bathing Assistance: Maximum assistance Feeding assistance: Maximum assistance Dressing Assistance: Maximum assistance     Functional Limitations Info  Hearing, Speech, Sight Sight Info: Adequate Hearing Info: Adequate Speech Info: Impaired(unable to communicate)    SPECIAL CARE FACTORS FREQUENCY  PT (By licensed PT), OT (By licensed OT), Speech therapy     PT Frequency: 5x/wk OT Frequency: 5x/wk     Speech Therapy Frequency: 5x/wk      Contractures Contractures Info: Not  present    Additional Factors Info  Code Status, Allergies, Psychotropic, Insulin Sliding Scale Code Status Info: DNR Allergies Info: Morphine and related Psychotropic Info: Zoloft 200mg  daily at bedtime Insulin Sliding Scale Info: 0-15 units every 4 hours; 7 units every 4 hours; lantus 20 units daily at bedtime       Current  Medications (10/09/2017):  This is the current hospital active medication list Current Facility-Administered Medications  Medication Dose Route Frequency Provider Last Rate Last Dose  . acetaminophen (TYLENOL) solution 650 mg  650 mg Per Tube Q4H PRN Layne Benton, NP   650 mg at 10/09/17 0855   Or  . acetaminophen (TYLENOL) suppository 650 mg  650 mg Rectal Q4H PRN Annie Main L, NP      . carvedilol (COREG) tablet 12.5 mg  12.5 mg Oral BID WC Marvel Plan, MD   12.5 mg at 10/09/17 0856  . cefTRIAXone (ROCEPHIN) 2 g in sodium chloride 0.9 % 100 mL IVPB  2 g Intravenous Q24H Marvel Plan, MD      . enoxaparin (LOVENOX) injection 40 mg  40 mg Subcutaneous Q24H Annie Main L, NP   40 mg at 10/08/17 1641  . famotidine (PEPCID) tablet 20 mg  20 mg Oral BID Annie Main L, NP   20 mg at 10/09/17 0901  . feeding supplement (GLUCERNA 1.2 CAL) liquid 1,000 mL  1,000 mL Per Tube Continuous Rinehuls, David L, PA-C 65 mL/hr at 10/09/17 0413 1,000 mL at 10/09/17 0413  . feeding supplement (PRO-STAT SUGAR FREE 64) liquid 60 mL  60 mL Per Tube BID Annie Main L, NP   60 mL at 10/09/17 0901  . free water 200 mL  200 mL Per Tube Q8H Marvel Plan, MD      . glipiZIDE (GLUCOTROL XL) 24 hr tablet 10 mg  10 mg Oral Daily Marvel Plan, MD   10 mg at 10/09/17 0900  . hydrALAZINE (APRESOLINE) injection 5 mg  5 mg Intravenous Q4H PRN Annie Main L, NP   5 mg at 10/04/17 1311  . insulin aspart (novoLOG) injection 0-15 Units  0-15 Units Subcutaneous Q4H Layne Benton, NP   5 Units at 10/09/17 1259  . insulin aspart (novoLOG) injection 7 Units  7 Units Subcutaneous Q4H Marvel Plan, MD   7 Units at 10/09/17 1259  . insulin glargine (LANTUS) injection 30 Units  30 Units Subcutaneous QHS Marvel Plan, MD      . isosorbide mononitrate (ISMO,MONOKET) tablet 10 mg  10 mg Oral BID Annie Main L, NP   10 mg at 10/09/17 0901  . MEDLINE mouth rinse  15 mL Mouth Rinse 10 times per day Annie Main L, NP   15 mL at 10/09/17 1400  .  metFORMIN (GLUCOPHAGE) tablet 1,000 mg  1,000 mg Oral BID WC Marvel Plan, MD   1,000 mg at 10/09/17 0900  . senna-docusate (Senokot-S) tablet 1 tablet  1 tablet Oral BID Layne Benton, NP   1 tablet at 10/09/17 0901  . sertraline (ZOLOFT) tablet 200 mg  200 mg Oral QHS Marvel Plan, MD   200 mg at 10/08/17 2211     Discharge Medications: Please see discharge summary for a list of discharge medications.  Relevant Imaging Results:  Relevant Lab Results:   Additional Information SS#: [id number]  Baldemar Lenis, LCSW

## 2017-10-09 NOTE — Care Management Note (Signed)
Case Management Note  Patient Details  Name: Dwayne MaterDennis [name] MRN: [id number] Date of Birth: 08/29/1945  Subjective/Objective:      Stroke, carotid stenosis, dysphagia, HTN urgency               Action/Plan: NCM spoke to pt's wife at bedside. PT recommended CIR. States she wants CIR but know they could not provide 24 hour care once dc from IP rehab. States she and daughters work. IP rehab consult and CSW referral for SNF placement.   Expected Discharge Date:             Expected Discharge Plan:  Skilled Nursing Facility  In-House Referral:  Clinical Social Work  Discharge planning Services  CM Consult  Post Acute Care Choice:  NA Choice offered to:  NA  DME Arranged:  N/A DME Agency:  NA  HH Arranged:  NA HH Agency:  NA  Status of Service:  Completed, signed off  If discussed at Long Length of Stay Meetings, dates discussed:    Additional Comments:  Elliot CousinShavis, Nadelyn Enriques Ellen, RN 10/09/2017, 2:41 PM

## 2017-10-10 ENCOUNTER — Inpatient Hospital Stay (HOSPITAL_COMMUNITY): Payer: Medicare Other

## 2017-10-10 DIAGNOSIS — E870 Hyperosmolality and hypernatremia: Secondary | ICD-10-CM

## 2017-10-10 DIAGNOSIS — I639 Cerebral infarction, unspecified: Secondary | ICD-10-CM

## 2017-10-10 DIAGNOSIS — J690 Pneumonitis due to inhalation of food and vomit: Secondary | ICD-10-CM

## 2017-10-10 LAB — BLOOD GAS, ARTERIAL
ACID-BASE EXCESS: 3.1 mmol/L — AB (ref 0.0–2.0)
BICARBONATE: 26.6 mmol/L (ref 20.0–28.0)
DRAWN BY: 358491
FIO2: 24
O2 Saturation: 92.3 %
PH ART: 7.467 — AB (ref 7.350–7.450)
PO2 ART: 65.6 mmHg — AB (ref 83.0–108.0)
Patient temperature: 98.6
pCO2 arterial: 37.3 mmHg (ref 32.0–48.0)

## 2017-10-10 LAB — CBC
HEMATOCRIT: 44.8 % (ref 39.0–52.0)
Hemoglobin: 14.7 g/dL (ref 13.0–17.0)
MCH: 33 pg (ref 26.0–34.0)
MCHC: 32.8 g/dL (ref 30.0–36.0)
MCV: 100.7 fL — AB (ref 78.0–100.0)
PLATELETS: 318 10*3/uL (ref 150–400)
RBC: 4.45 MIL/uL (ref 4.22–5.81)
RDW: 13.2 % (ref 11.5–15.5)
WBC: 25.5 10*3/uL — ABNORMAL HIGH (ref 4.0–10.5)

## 2017-10-10 LAB — BASIC METABOLIC PANEL
Anion gap: 16 — ABNORMAL HIGH (ref 5–15)
BUN: 63 mg/dL — ABNORMAL HIGH (ref 6–20)
CHLORIDE: 111 mmol/L (ref 101–111)
CO2: 26 mmol/L (ref 22–32)
Calcium: 10 mg/dL (ref 8.9–10.3)
Creatinine, Ser: 1.2 mg/dL (ref 0.61–1.24)
GFR, EST NON AFRICAN AMERICAN: 59 mL/min — AB (ref >60)
Glucose, Bld: 108 mg/dL — ABNORMAL HIGH (ref 65–99)
POTASSIUM: 4.2 mmol/L (ref 3.5–5.1)
Sodium: 153 mmol/L — ABNORMAL HIGH (ref 135–145)

## 2017-10-10 LAB — GLUCOSE, CAPILLARY
GLUCOSE-CAPILLARY: 124 mg/dL — AB (ref 65–99)
GLUCOSE-CAPILLARY: 154 mg/dL — AB (ref 65–99)
GLUCOSE-CAPILLARY: 233 mg/dL — AB (ref 65–99)
Glucose-Capillary: 115 mg/dL — ABNORMAL HIGH (ref 65–99)
Glucose-Capillary: 126 mg/dL — ABNORMAL HIGH (ref 65–99)
Glucose-Capillary: 176 mg/dL — ABNORMAL HIGH (ref 65–99)
Glucose-Capillary: 227 mg/dL — ABNORMAL HIGH (ref 65–99)

## 2017-10-10 LAB — URINE CULTURE

## 2017-10-10 MED ORDER — FREE WATER: 200.0000 mL | Status: DC

## 2017-10-10 MED ORDER — FREE WATER
300.0000 mL | Status: DC
  Administered 2017-10-10 – 2017-10-12 (×12): 300 mL

## 2017-10-10 MED ORDER — IOPAMIDOL (ISOVUE-370) INJECTION 76%
100.0000 mL | Freq: Once | INTRAVENOUS | Status: AC | PRN
  Administered 2017-10-10: 80 mL via INTRAVENOUS

## 2017-10-10 MED ORDER — PIPERACILLIN-TAZOBACTAM 3.375 G IVPB
3.3750 g | Freq: Three times a day (TID) | INTRAVENOUS | Status: DC
  Administered 2017-10-10 – 2017-10-13 (×9): 3.375 g via INTRAVENOUS
  Filled 2017-10-10 (×10): qty 50

## 2017-10-10 MED ORDER — IOPAMIDOL (ISOVUE-370) INJECTION 76%
INTRAVENOUS | Status: AC
  Filled 2017-10-10: qty 100

## 2017-10-10 NOTE — Progress Notes (Signed)
Bilateral lower extremity venous duplex has been completed. Negative for DVT.  10/10/17 1:54 PM Olen CordialGreg Britne Borelli RVT

## 2017-10-10 NOTE — Progress Notes (Signed)
ANTIBIOTIC CONSULT NOTE - INITIAL  Pharmacy Consult for Zosyn Indication: aspiration PNA  Allergies  Allergen Reactions  . Morphine And Related Anaphylaxis    Swelling of the tongue and face    Patient Measurements: Height: 5\' 10"  (177.8 cm) Weight: 250 lb 10.6 oz (113.7 kg) IBW/kg (Calculated) : 73 Adjusted Body Weight:    Vital Signs: Temp: 99.8 F (37.7 C) (06/04 1519) Temp Source: Oral (06/04 1519) BP: 122/55 (06/04 1519) Pulse Rate: 86 (06/04 1519) Intake/Output from previous day: 06/03 0701 - 06/04 0700 In: 1676.3 [NG/GT:1576.3; IV Piggyback:100] Out: -  Intake/Output from this shift: No intake/output data recorded.  Labs: Recent Labs    10/08/17 0541 10/09/17 0612 10/10/17 0421  WBC 18.1* 23.3* 25.5*  HGB 14.7 15.1 14.7  PLT 321 304 318  CREATININE  --  1.09 1.20   Estimated Creatinine Clearance: 70.3 mL/min (by C-G formula based on SCr of 1.2 mg/dL). No results for input(s): VANCOTROUGH, VANCOPEAK, VANCORANDOM, GENTTROUGH, GENTPEAK, GENTRANDOM, TOBRATROUGH, TOBRAPEAK, TOBRARND, AMIKACINPEAK, AMIKACINTROU, AMIKACIN in the last 72 hours.   Microbiology:   Medical History: Past Medical History:  Diagnosis Date  . Anxiety   . Aortic atherosclerosis (HCC) 08/14/2017  . Arthritis    "knees, elbows, hands" (02/12/2014)  . Compression fracture of L2 (HCC) 03/08/2013  . COPD (chronic obstructive pulmonary disease) (HCC)   . Coronary artery disease    Dr. Delton SeeNelson, [facility] follows  . Depression   . Esophageal reflux   . Gallstones   . Hyperlipidemia   . Hypertension, benign    Benign  . 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 Myocardial infarction (HCC) 1998 X 2   "probably a couple since 1998" (02/12/2014)  . Palpitations   . Pneumonia ~ 2004 X 1  . Swallowing difficulty    intermittent-occ"doesn't matter with solid or liquid"   Assessment: CC/HPI: Large L basal ganglia/thalamic hemorrhage with  intraventricular extension secondary to HTN  PMH: HTN, DM, CAD, HLD tobacco, anxiety/depression, alcohol  ID: Tmax 99.8. WBC 25.5 up. Start Zosyn for aspiration PNA. CrCl 70. 6/4 CT consistent with PNA. 6/4 Zosyn>>   Goal of Therapy:  Eradication of infection   Plan:  Zosyn 3.375g IV q 8 hrs.   Ruhee Enck S. Merilynn Finlandobertson, PharmD, BCPS Clinical Staff Pharmacist Pager [phone]  Misty Stanleyobertson, Mckade Gurka Stillinger 10/10/2017,5:00 PM

## 2017-10-10 NOTE — Progress Notes (Signed)
Inpatient Rehabilitation Admissions Coordinator  Patient is not a candidate for an inpt rehab admit at his current functional level. He will need SNF rehab. We will sign off at this time. I will alert RN CM and SW.  Ottie GlazierBarbara Alyce Inscore, RN, MSN Rehab Admissions Coordinator [phone] 10/10/2017 11:45 AM

## 2017-10-10 NOTE — Plan of Care (Signed)
  Problem: Education: Goal: Knowledge of General Education information will improve Outcome: Progressing   Problem: Health Behavior/Discharge Planning: Goal: Ability to manage health-related needs will improve Outcome: Progressing   Problem: Clinical Measurements: Goal: Ability to maintain clinical measurements within normal limits will improve Outcome: Progressing Goal: Will remain free from infection Outcome: Progressing Goal: Diagnostic test results will improve Outcome: Progressing Goal: Respiratory complications will improve Outcome: Progressing Goal: Cardiovascular complication will be avoided Outcome: Progressing   Problem: Activity: Goal: Risk for activity intolerance will decrease Outcome: Progressing   Problem: Nutrition: Goal: Adequate nutrition will be maintained Outcome: Progressing   Problem: Pain Managment: Goal: General experience of comfort will improve Outcome: Progressing   Problem: Safety: Goal: Ability to remain free from injury will improve Outcome: Progressing   Problem: Skin Integrity: Goal: Risk for impaired skin integrity will decrease Outcome: Progressing   Problem: Education: Goal: Knowledge of disease or condition will improve Outcome: Progressing Goal: Knowledge of secondary prevention will improve Outcome: Progressing Goal: Knowledge of patient specific risk factors addressed and post discharge goals established will improve Outcome: Progressing   Problem: Coping: Goal: Will verbalize positive feelings about self Outcome: Progressing Goal: Will identify appropriate support needs Outcome: Progressing   Problem: Health Behavior/Discharge Planning: Goal: Ability to manage health-related needs will improve Outcome: Progressing   Problem: Self-Care: Goal: Ability to participate in self-care as condition permits will improve Outcome: Progressing Goal: Verbalization of feelings and concerns over difficulty with self-care will  improve Outcome: Progressing Goal: Ability to communicate needs accurately will improve Outcome: Progressing   Problem: Nutrition: Goal: Risk of aspiration will decrease Outcome: Progressing Goal: Dietary intake will improve Outcome: Progressing   Problem: Intracerebral Hemorrhage Tissue Perfusion: Goal: Complications of Intracerebral Hemorrhage will be minimized Outcome: Progressing

## 2017-10-10 NOTE — Progress Notes (Signed)
OT Cancellation Note  Patient Details Name: Dwayne [name] MRN: [id number] DOB: 10/17/1945   Cancelled Treatment:    Reason Eval/Treat Not Completed: Patient at procedure or test/ unavailable; will follow up as schedule allows.   Dwayne [name], OT Pager [phone] 10/10/2017   Dwayne [name] 10/10/2017, 2:12 PM

## 2017-10-10 NOTE — Progress Notes (Signed)
Occupational Therapy Treatment Patient Details Name: Dwayne [name] MRN: [id number] DOB: 06-Aug-1945 Today's Date: 10/10/2017    History of present illness 68 male s/p ICH L basal ganglia PMH: COPD, MI, CAD, HF   OT comments  Pt presents supine in bed with spouse present start of session. Pt continues to have increased lethargy and level of arousal this session, only intermittently opening eyes during session. Pt minimally follows one step commands, requiring Max hand over hand assist to wash face using LUE this session; pt requiring totalA for all additional ADLs at this time. Completed additional PROM to L/R UE. Discharge recommendations have been updated to SNF due to recent decline in functional status. Will continue to follow acutely to progress pt towards established OT goals.    Follow Up Recommendations  SNF;Supervision/Assistance - 24 hour    Equipment Recommendations  3 in 1 bedside commode;Wheelchair (measurements OT);Wheelchair cushion (measurements OT);Hospital bed(hoyer lift; to be further assessed)          Precautions / Restrictions Precautions Precautions: Fall Restrictions Weight Bearing Restrictions: No                                                     ADL either performed or assessed with clinical judgement   ADL Overall ADL's : Needs assistance/impaired     Grooming: Maximal assistance;Bed level Grooming Details (indicate cue type and reason): max hand over hand assist to wash face                                General ADL Comments: pt minimally responsive to commands during session      Vision   Additional Comments: pt decreased ability to track this session, often dozing off and keeping eyes closed               Cognition Arousal/Alertness: Lethargic Behavior During Therapy: Flat affect Overall Cognitive Status: Difficult to assess Area of Impairment: Attention;Following commands;Problem solving                    Current Attention Level: Focused   Following Commands: Follows one step commands inconsistently     Problem Solving: Slow processing;Decreased initiation;Difficulty sequencing;Requires verbal cues;Requires tactile cues General Comments: Continued limited arousal during session. Does tend to open eyes for brief periods during positional changes, but little effort given with commands        Exercises General Exercises - Upper Extremity Shoulder Flexion: PROM;10 reps;Supine;Both Shoulder Extension: PROM;10 reps;Supine;Both Shoulder Horizontal ABduction: PROM;10 reps;Right;Supine Shoulder Horizontal ADduction: PROM;10 reps;Right;Supine Elbow Flexion: PROM;10 reps;Supine;Both Elbow Extension: PROM;10 reps;Supine;Both Digit Composite Flexion: PROM;10 reps;Right;Supine Composite Extension: PROM;10 reps;Right;Supine                Pertinent Vitals/ Pain       Pain Assessment: Faces Faces Pain Scale: No hurt Pain Intervention(s): Monitored during session                                                          Frequency  Min 2X/week        Progress Toward Goals  OT  Goals(current goals can now be found in the care plan section)  Progress towards OT goals: Not progressing toward goals - comment(pt with increased lethargy and decreased ability to follow commands )  Acute Rehab OT Goals Patient Stated Goal: none stated by patient  OT Goal Formulation: Patient unable to participate in goal setting Time For Goal Achievement: 02/01/18 Potential to Achieve Goals: Fair  Plan Discharge plan needs to be updated    Co-evaluation                 AM-PAC PT "6 Clicks" Daily Activity     Outcome Measure   Help from another person eating meals?: Total Help from another person taking care of personal grooming?: Total Help from another person toileting, which includes using toliet, bedpan, or urinal?: Total Help from another person  bathing (including washing, rinsing, drying)?: Total Help from another person to put on and taking off regular upper body clothing?: Total Help from another person to put on and taking off regular lower body clothing?: Total 6 Click Score: 6    End of Session Equipment Utilized During Treatment: Oxygen  OT Visit Diagnosis: Unsteadiness on feet (R26.81);Muscle weakness (generalized) (M62.81);Other symptoms and signs involving cognitive function;Hemiplegia and hemiparesis Hemiplegia - Right/Left: Right Hemiplegia - dominant/non-dominant: Dominant Hemiplegia - caused by: Nontraumatic intracerebral hemorrhage   Activity Tolerance Patient limited by lethargy   Patient Left in bed;with call bell/phone within reach;with family/visitor present;with bed alarm set   Nurse Communication Mobility status        Time: [id number] OT Time Calculation (min): 20 min  Charges: OT General Charges $OT Visit: 1 Visit OT Treatments $Therapeutic Activity: 8-22 mins  Marcy SirenBreanna Anila Bojarski, OT Pager [phone] 10/10/2017    Dwayne [name] 10/10/2017, 4:54 PM

## 2017-10-10 NOTE — Progress Notes (Signed)
Nutrition Follow-up  DOCUMENTATION CODES:   Morbid obesity  INTERVENTION:  Continue Glucerna 1.2 @ 65 ml/hr + 60 ml Prostat BID providing 2272 kcal, 154 grams protein, 1264 ml H2O; meeting 100% of kcal needs and 103% protein needs.  NUTRITION DIAGNOSIS:   Inadequate oral intake related to inability to eat as evidenced by NPO status.  Ongoing  GOAL:   Patient will meet greater than or equal to 90% of their needs  Ongoing - progressing  MONITOR:   TF tolerance, Labs  REASON FOR ASSESSMENT:   Consult Enteral/tube feeding initiation and management  ASSESSMENT:   Pt with PMH of COPD, CAD, DM, HTN, and HLD admitted 5/24 with large L thalamic bleed.  5/29 cortrak placed; Jevity 1.2 @ 65 ml/hr + 60 ml Prostat BID 5/31 PEG placement 6/2 switched to Glucerna due to uncontrolled hyperglycemia; still uncontrolled on Glucerna  Per PT note, pt recommended to be discharged to SNF.   Pt unable to respond, but awake in bed. PT wife at bedside; answered wife questions regarding TF and BG tolerance. Wife reports at night pt is more alert, but seems to be tolerating TF "as far as I can tell". Pt wife reports that he is not progressing like she wants him to and that it is hard to see him like that.   Medications reviewed: coreg, pepcid, PS 60 ml BID, glucerna 1.2 @ 65 ml/hr, free water 300 ml Q4H, glucotrol, novolog 0-15 & 7 units, lantus, metformin, senokot, zoloft, rocephin.   Labs reviewed: Na 153 (H), BG 108 (H), BUN 63 (H), GFR 59 (L), WBC 25.5 (H).   Diet Order:   Diet Order    None     EDUCATION NEEDS:   No education needs have been identified at this time  Skin:  Skin Assessment: (MASD: groin and abdomen)  Last BM:  6/3  Height:   Ht Readings from Last 1 Encounters:  09/29/17 5\' 10"  (1.778 m)    Weight:   Wt Readings from Last 1 Encounters:  10/10/17 250 lb 10.6 oz (113.7 kg)    Ideal Body Weight:  75.45 kg  BMI:  Body mass index is 35.97  kg/m.  Estimated Nutritional Needs:   Kcal:  2200-2400  Protein:  130-150 grams  Fluid:  >2 L/day    Sherrine MaplesMelissa Shikira Folino, Dietetic Intern

## 2017-10-10 NOTE — Progress Notes (Addendum)
STROKE TEAM PROGRESS NOTE   SUBJECTIVE (INTERVAL HISTORY) RN at bedside. Pt lying in bed, respiratory rate elevated. Sats 92/94% with mouth breathing. Will waken with stimulation, but difficulty staying awake. Sweaty to touch but afebrile. Peg site ok. LE same size and soft. WBC even higher than yesterday.   OBJECTIVE Temp:  [97.7 F (36.5 C)-98.9 F (37.2 C)] 97.7 F (36.5 C) (06/04 0306) Pulse Rate:  [83-92] 87 (06/04 0306) Resp:  [20] 20 (06/04 0306) BP: (113-134)/(56-81) 134/66 (06/04 0306) SpO2:  [93 %-95 %] 93 % (06/04 0306) Weight:  [113.7 kg (250 lb 10.6 oz)] 113.7 kg (250 lb 10.6 oz) (06/04 0306)  CBC:  Recent Labs  Lab 10/07/17 0333 10/08/17 0541 10/09/17 0612 10/10/17 0421  WBC 16.6* 18.1* 23.3* 25.5*  NEUTROABS 12.1* 12.9*  --   --   HGB 14.5 14.7 15.1 14.7  HCT 43.6 44.9 46.6 44.8  MCV 98.9 99.1 100.6* 100.7*  PLT 317 321 304 318    Basic Metabolic Panel:  Recent Labs  Lab 10/09/17 0612 10/10/17 0421  NA 149* 153*  K 4.1 4.2  CL 109 111  CO2 28 26  GLUCOSE 271* 108*  BUN 50* 63*  CREATININE 1.09 1.20  CALCIUM 9.7 10.0   IMAGING Ct Angio Chest PE w/ w/o Contrast 10/10/2017 1. No demonstrable pulmonary embolus. No thoracic aortic aneurysm. No dissection seen. Note that the contrast bolus for assessment for dissection is less than optimal, and dissection cannot be excluded is a differential consideration confidently on this study. There Is aortic atherosclerosis as well as foci of great vessel and coronary artery calcification. 2. Airspace consolidation consistent with pneumonia posterior right base. Underlying centrilobular and paraseptal emphysematous change with areas of scattered fibrosis. 3.  No appreciable adenopathy. 4. Stable 5 mm nodular opacity left upper lobe anteriorly toward the apex. 5.  Gallbladder absent  Ct Head Wo Contrast 10/09/2017 1. Unchanged size of left gangliothalamic intraparenchymal hematoma with decreased density of blood  products consistent with aging hematoma. 2. Decreased volume of intraventricular blood without hydrocephalus.    Portable CXR 1 View 10/08/2017 1. No active disease.  No evidence of pneumonia or pulmonary edema. 2. Probable chronic interstitial lung disease.  Ct Head Wo Contrast 10/02/2017 Unchanged examination of intraparenchymal hematoma centered in the left thalamus and basal ganglia with intraventricular extension.   Ct Head Wo Contrast 09/30/2017 Essentially stable LEFT thalamic/basal ganglia hemorrhage with intraventricular extension. No increase in size, worsening shift, or hydrocephalus.   Ct Head Wo Contrast 09/29/2017 1. Increase size of hematoma within the left basal ganglia. Stable intraventricular hemorrhage with minimal redistribution to the occipital horn of right lateral ventricle.  2. Mild increase in local mass effect. Stable 4 mm left-to-right midline shift.   Ct Angio Head W Or Wo Contrast 09/29/2017 1. No acute or focal vascular lesion to explain the intracranial hemorrhage.  2. Atherosclerotic changes in the cavernous internal carotid arteries bilaterally with moderate stenoses relative to the more distal ICA termini, right greater than left.  3. Asymmetric narrowing of the distal left ICA lumen below the skull base suggesting a significant proximal stenosis.  4. No significant change in the parenchymal hemorrhage or intraventricular component.   Ct Head Code Stroke Wo Contrast 09/29/2017 1. 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 Hemorrhage measures 2.5 x 4.2 x 2.0 cm (estimated volume is 88 cm^3).  2. Intraventricular extension of hemorrhage into the left lateral ventricle to the foramen of Monro. There is early dilation of the  left lateral ventricle concerning for developing hydrocephalus.  3. 4 mm left right midline shift.   Transthoracic Echocardiogram 10/05/2017 Study Conclusions - Left ventricle: The cavity size was normal. Wall thickness was  increased in a pattern of moderate LVH. Systolic function was normal. The estimated ejection fraction was in the range of 55% to 60%. Wall motion was normal; there were no regional wall motion abnormalities. Doppler parameters are consistent with abnormal left ventricular relaxation (grade 1 diastolic dysfunction). - Left atrium: The atrium was moderately dilated. Impressions:   No cardiac source of embolism was identified, but cannot be ruled out on the basis of this examination.  Bilateral Carotid Dopplers  10/05/2017 Final Interpretation: Right Carotid: Velocities in the right ICA are consistent with a 80-99% stenosis. Left Carotid: Velocities in the left ICA are consistent with a 80-99% stenosis. Vertebrals: Bilateral vertebral arteries demonstrate antegrade flow.  Bilateral LE Dopplers  10/10/2017 negative for DVT   PHYSICAL EXAM Afebrile. Head is nontraumatic. Neck is supple without bruit.Cardiac exam no murmur or gallop. Tachypnea. Sats 92-94% on O2. Lungs are clear to auscultation. Distal pulses are well felt. No edema in LE, LE size the same. PEG site unremarkable, non-tender.  Neurological Exam : Somnolent. Awakes with effort but returns to sleep quickly. Globally aphasic. Left gaze deviation. Unable to look to the right past midline. Does not blink to threat on the right.  Right lower facial weakness. Inconsistently followed squeeze/let go of hand, o/w does not follow any commands. Tongue midline. Cough and gag positive. Motor system exam reveals spontaneous antigravity and purposeful left upper movement, left arm action tremor.  LLE moves spontaneously. Right upper extremity flicker  Withdraws bilateral lower extremity to pain . Right plantar upgoing left downgoing. Brisk reflexes. Sensation, coordination and gait not tested.   ASSESSMENT/PLAN Dwayne [name] is a 72 y.o. male with history of coronary artery disease with previous MI, COPD with continued tobacco use, diabetes  mellitus, hyperlipidemia, hypertension, depression, and anxiety presenting with onset of right-sided weakness followed by unresponsiveness.  He did not receive IV t-PA due to ICH.  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  Resultant  Right hemiparesis, left gaze, global aphasia  CT head - Acute left basal ganglia ICH Stable x 3  CTA Head - No acute or focal vascular lesion to explain the ICH. Bilateral cavernous carotid moderate stenosis R>L.  Repeat CT head 6/3 resolving IVH, stable IPH  Carotid Doppler - Bilateral 80-99% stenosis  2D Echo -EF 55 to 60%.  No cardiac source of emboli identified  LE doppler negative for DVT  LDL - 37  HgbA1c - 8.7  VTE prophylaxis - Lovenox 40 mg sq daily   aspirin 81 mg daily and clopidogrel 75 mg daily prior to admission, now on No antithrombotic due to ICH  Ongoing aggressive stroke risk factor management  Therapy recommendations:  SNF (family works during the day and unable to provide 24/7 care following CIR)  Disposition:  Pending. Not medically ready for d/c - recheck labs in am - if stable - maybe tomorrow or Thurs  Carotid Stenosis  Severe B ICA stenosis 80-99%  Given hmg, unable to treat with antiplatelets  Not a surgical candidate at this time  AVOID HYPOTENSION  Dysphagia, secondary to stroke  NPO. Failed swallow.   s/p PEG placement 10/06/17  Site unremarkable  Hypertensive Emergency . Treated initially with IV Cardene  . BP now 110-140s . SBP goal 130-160 given b/l ICA high  grade stenosis . Remains on coreg to 12.5 bid, isosorbide mononitrate 10 bid  Hyperlipidemia  Lipid lowering medication PTA:  Lipitor 80 mg daily PTA  LDL 37, goal < 70  Hold off statin for now due to low level of LDL  Diabetes  HgbA1c 8.7, goal < 7.0  Uncontrolled  Continue to have hyperglycemia, but better [phone]    Now on glucerna 10/08/17  Received 54 u insulin past 24h -> increased  lantus 30u  Increased novolog 7u Q4h  On home meds of glipizide and metformin 6/2  SSI  CBG monitoring  Leukocytosis, Tachypnea  WBC 15.9->13.7->16.6 -> 18.1->23.3->25.5  Afebrile  sweaty  CXR stable 10/04/17. CXR repeat 10/08/2017 negative.  UA trace leukocytes, no bacteria, WBC 0-6-10  UCx mult species, asked then to recollect  BCx pending   PEG site unremarkable  CT chest neg for PE  Repeat labs in am  Other Stroke Risk Factors  Advanced age  Cigarette smoker will be advised to stop smoking  ETOH use, advised to drink no more than 1 alcoholic beverage per day.  Obesity, Body mass index is 35.97 kg/m., recommend weight loss, diet and exercise as appropriate  Coronary artery disease  Other Active Problems  Anxiety and depression history - consider resuming Zoloft Hypernatremia, increasing 149->152. Free water increased, Check labs in am AKI, BUN/Cr 50/1.09->63/1.2  Hospital day # 11  Annie Main, MSN, APRN, ANVP-BC, AGPCNP-BC Advanced Practice Stroke Nurse [facility] See Amion for Schedule & Pager information 10/10/2017 8:45 AM   ATTENDING NOTE: I reviewed above note and agree with the assessment and plan. I have made any additions or clarifications directly to the above note. Pt was seen and examined.   Patient continued to be lethargic, drowsy and sleepy.  Able to arouse with repetitive stimulation.  As per RN, he was at a similar condition overnight.  On today's visit, he was having mouth breathing, tachypnea.  Still has global aphasia and left gaze preference, but neuro stable.  Chest x-ray yesterday unremarkable.  UA negative.  Had LE venous Doppler no DVT.  Had CTA chest ruled out PE but showed right lung base pneumonia.  ABG showed respiratory alkalosis.  Continue to have leukocytosis WBC 25.5 today up from yesterday 23.3.  Sodium 153 up from yesterday 149.  Creatinine 1.2.  Glucose much better controlled.  Increased free water to 300 to  4 hours.  Afebrile, PEG tube site no signs of infection.  Given the CT findings of right lung base pneumonia, will change rocephin to zosyn for empiric treatment of aspiration pneumonia. If no improvement in am, will consult internal medicine.  Marvel Plan, MD PhD Stroke Neurology 10/10/2017 4:42 PM  I spent  35 minutes in total face-to-face time with the patient, more than 50% of which was spent in counseling and coordination of care, reviewing test results, images and medication, and discussing the diagnosis of leukocytosis, altered mental status, aspiration pneumonia, hyponatremia, treatment plan and potential prognosis. This patient's care requiresreview of multiple databases, neurological assessment, discussion with family, other specialists and medical decision making of high complexity. I had long discussion with wife at bedside, updated pt current condition, treatment plan and potential prognosis. She expressed understanding and appreciation.          To contact Stroke Continuity provider, please refer to [email or web address]. After hours, contact General Neurology

## 2017-10-10 NOTE — Progress Notes (Signed)
Physical Therapy Treatment Patient Details Name: Dwayne [name] MRN: [id number] DOB: 01/02/1946 Today's Date: 10/10/2017    History of Present Illness [id number] male s/p ICH L basal ganglia PMH: COPD, MI, CAD, HF    PT Comments    Continued limited arousal during session today with PT session limited to bed level activities. Does tend to open eyes and arouse with positional changes as well as noxious stimuli of L UE and LE, but with limited ability to follow one-step commands and produce intentional movements. Continued Max/Total A +2 for bed mobility and supine <> sit transfers. PT to continue to follow acutely to progress functional mobility.    Follow Up Recommendations  SNF;Supervision/Assistance - 24 hour     Equipment Recommendations  Other (comment)(TBD)    Recommendations for Other Services       Precautions / Restrictions Precautions Precautions: Fall Restrictions Weight Bearing Restrictions: No    Mobility  Bed Mobility Overal bed mobility: Needs Assistance Bed Mobility: Supine to Sit;Sit to Supine     Supine to sit: Total assist;+2 for physical assistance Sit to supine: Total assist;+2 for physical assistance   General bed mobility comments: Total assist for supine to sit EOB. HOB elevated during initial mobility. Does pull L LE away while donning socks as well as respond to noxious stimuli when seated upright. Seated EOB x 10 min with PT encouraging LE motion with limited carryover.  Transfers                 General transfer comment: deferred due to lethargy  Ambulation/Gait                 Stairs             Wheelchair Mobility    Modified Rankin (Stroke Patients Only) Modified Rankin (Stroke Patients Only) Pre-Morbid Rankin Score: No symptoms Modified Rankin: Severe disability     Balance Overall balance assessment: Needs assistance Sitting-balance support: Feet supported;Single extremity supported Sitting balance-Leahy Scale:  Zero Sitting balance - Comments: required Max A +2 for upright sitting posture with heavy assist to elevate head                                     Cognition Arousal/Alertness: Lethargic Behavior During Therapy: Flat affect(limited arousal) Overall Cognitive Status: Difficult to assess Area of Impairment: Attention;Following commands;Problem solving                   Current Attention Level: Focused   Following Commands: Follows one step commands inconsistently     Problem Solving: Slow processing;Decreased initiation;Difficulty sequencing;Requires verbal cues;Requires tactile cues General Comments: Continued limited arousal during session. Does tend to open eyes for brief periods during positional changes, but little effort given with commands      Exercises      General Comments        Pertinent Vitals/Pain Pain Assessment: Faces Faces Pain Scale: No hurt    Home Living                      Prior Function            PT Goals (current goals can now be found in the care plan section) Acute Rehab PT Goals PT Goal Formulation: With family Time For Goal Achievement: 10/11/17 Potential to Achieve Goals: Fair Progress towards PT goals: Progressing toward goals    Frequency  Min 3X/week      PT Plan Current plan remains appropriate    Co-evaluation              AM-PAC PT "6 Clicks" Daily Activity  Outcome Measure  Difficulty turning over in bed (including adjusting bedclothes, sheets and blankets)?: Unable Difficulty moving from lying on back to sitting on the side of the bed? : Unable Difficulty sitting down on and standing up from a chair with arms (e.g., wheelchair, bedside commode, etc,.)?: Unable Help needed moving to and from a bed to chair (including a wheelchair)?: Total Help needed walking in hospital room?: Total Help needed climbing 3-5 steps with a railing? : Total 6 Click Score: 6    End of Session  Equipment Utilized During Treatment: Oxygen Activity Tolerance: Patient limited by fatigue;Patient limited by lethargy Patient left: in bed;with call bell/phone within reach;with bed alarm set;with family/visitor present;with SCD's reapplied Nurse Communication: Mobility status PT Visit Diagnosis: Other abnormalities of gait and mobility (R26.89);Other symptoms and signs involving the nervous system (R29.898);Hemiplegia and hemiparesis Hemiplegia - Right/Left: Right Hemiplegia - dominant/non-dominant: Dominant Hemiplegia - caused by: Cerebral infarction     Time: 1610-9604 PT Time Calculation (min) (ACUTE ONLY): 35 min  Charges:  $Therapeutic Activity: 23-37 mins                    G Codes:       Kipp Laurence, PT, DPT 10/10/17 11:44 AM

## 2017-10-10 NOTE — Progress Notes (Signed)
  Speech Language Pathology Treatment: Dysphagia  Patient Details Name: Olena MaterDennis Corliss MRN: [id number] DOB: 10/10/1945 Today's Date: 10/10/2017 Time: 1100-1108 SLP Time Calculation (min) (ACUTE ONLY): 8 min  Assessment / Plan / Recommendation Clinical Impression  Pt not appropriately alert for fourth consecutive session. Repositioned pt, provided oral care and deep suction to oropharynx with return of thick standing secretions. Pt responded with one soft throat clear, minimal effort to resist. Pt grasped wash cloth and icee, but did not attempt to use either. With total assist placement of icee to mouth for max tactile stim pt did not suck, lick or demonstrate any awareness of presence of PO. Given very poor ability to participate in interventions and no progress over therapeutic trial will sign off at this time. Please reorder if pts function improves.    HPI HPI: 72 year old man with a history of COPD, coronary artery disease (on aspirin and Plavix), diabetes, hypertension, hyperlipidemia.  He developed acute inability to stand, right-sided weakness on 5/24, was found to be severely hypertensive to the 200s with a suppressed sensorium.  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 Intubated on 5/24, one way extubation on 5/28.       SLP Plan  Discharge SLP treatment due to (comment)       Recommendations  Diet recommendations: NPO                Plan: Discharge SLP treatment due to (comment)       GO               Harlon DittyBonnie Arlethia Basso, MA CCC-SLP [phone]  Claudine MoutonDeBlois, Omaya Nieland Caroline 10/10/2017, 11:14 AM

## 2017-10-11 LAB — BASIC METABOLIC PANEL
ANION GAP: 12 (ref 5–15)
BUN: 68 mg/dL — AB (ref 6–20)
CHLORIDE: 111 mmol/L (ref 101–111)
CO2: 29 mmol/L (ref 22–32)
Calcium: 9.8 mg/dL (ref 8.9–10.3)
Creatinine, Ser: 1.17 mg/dL (ref 0.61–1.24)
GFR calc Af Amer: >60 mL/min (ref >60)
GLUCOSE: 210 mg/dL — AB (ref 65–99)
POTASSIUM: 4.3 mmol/L (ref 3.5–5.1)
Sodium: 152 mmol/L — ABNORMAL HIGH (ref 135–145)

## 2017-10-11 LAB — GLUCOSE, CAPILLARY
GLUCOSE-CAPILLARY: 234 mg/dL — AB (ref 65–99)
GLUCOSE-CAPILLARY: 150 mg/dL — AB (ref 65–99)
Glucose-Capillary: 110 mg/dL — ABNORMAL HIGH (ref 65–99)
Glucose-Capillary: 147 mg/dL — ABNORMAL HIGH (ref 65–99)
Glucose-Capillary: 197 mg/dL — ABNORMAL HIGH (ref 65–99)
Glucose-Capillary: 250 mg/dL — ABNORMAL HIGH (ref 65–99)

## 2017-10-11 LAB — CBC
HEMATOCRIT: 44.3 % (ref 39.0–52.0)
HEMOGLOBIN: 14.6 g/dL (ref 13.0–17.0)
MCH: 33 pg (ref 26.0–34.0)
MCHC: 33 g/dL (ref 30.0–36.0)
MCV: 100.2 fL — AB (ref 78.0–100.0)
Platelets: 309 10*3/uL (ref 150–400)
RBC: 4.42 MIL/uL (ref 4.22–5.81)
RDW: 13.2 % (ref 11.5–15.5)
WBC: 22.3 10*3/uL — AB (ref 4.0–10.5)

## 2017-10-11 NOTE — Progress Notes (Signed)
Inpatient Diabetes Program Recommendations  AACE/ADA: New Consensus Statement on Inpatient Glycemic Control (2015)  Target Ranges:  Prepandial:   less than 140 mg/dL      Peak postprandial:   less than 180 mg/dL (1-2 hours)      Critically ill patients:  140 - 180 mg/dL  Results for Olena MaterHOARD, Aashir (MRN [id number]) as of 10/11/2017 11:23  Ref. Range 10/10/2017 08:00 10/10/2017 11:38 10/10/2017 15:48 10/10/2017 20:30 10/10/2017 23:51 10/11/2017 03:30 10/11/2017 08:25  Glucose-Capillary Latest Ref Range: 65 - 99 mg/dL 409154 (H) 811233 (H) 914126 (H) 115 (H) 227 (H) 250 (H) 197 (H)   Review of Glycemic Control  Current orders for Inpatient glycemic control: Lantus 30 QHS, Novolog 7 units Q4H (tube feeding coverage), Novolog 0-15 units Q4H, Metformin 1000 mg BID, Glipizide XL 10 mg daily; Glucerna @ 65 ml/hr  Inpatient Diabetes Program Recommendations: Insulin - Basal: Please consider increasing Lantus to 35 units QHS.  Thanks, Orlando PennerMarie Lerin Jech, RN, MSN, CDE Diabetes Coordinator Inpatient Diabetes Program [phone] (Team Pager from 8am to 5pm)

## 2017-10-11 NOTE — Progress Notes (Addendum)
STROKE TEAM PROGRESS NOTE   SUBJECTIVE (INTERVAL HISTORY) NT working with him, giving him a bath. She reports he is not doing anything for her other than helping hold on to the bed rail when she turns him. He is awake, alert. No speech. Continues with mouth breathing, less labored.   OBJECTIVE Temp:  [97.8 F (36.6 C)-100.5 F (38.1 C)] 98.9 F (37.2 C) (06/05 0830) Pulse Rate:  [75-90] 75 (06/05 0830) Resp:  [16-20] 20 (06/05 0830) BP: (113-142)/(55-98) 142/77 (06/05 0830) SpO2:  [92 %-99 %] 92 % (06/05 0830) Weight:  [123.1 kg (271 lb 6.2 oz)] 123.1 kg (271 lb 6.2 oz) (06/05 0359)  CBC:  Recent Labs  Lab 10/07/17 0333 10/08/17 0541  10/10/17 0421 10/11/17 0731  WBC 16.6* 18.1*   < > 25.5* 22.3*  NEUTROABS 12.1* 12.9*  --   --   --   HGB 14.5 14.7   < > 14.7 14.6  HCT 43.6 44.9   < > 44.8 44.3  MCV 98.9 99.1   < > 100.7* 100.2*  PLT 317 321   < > 318 309   < > = values in this interval not displayed.    Basic Metabolic Panel:  Recent Labs  Lab 10/10/17 0421 10/11/17 0731  NA 153* 152*  K 4.2 4.3  CL 111 111  CO2 26 29  GLUCOSE 108* 210*  BUN 63* 68*  CREATININE 1.20 1.17  CALCIUM 10.0 9.8   IMAGING Ct Angio Chest PE w/ w/o Contrast 10/10/2017 1. No demonstrable pulmonary embolus. No thoracic aortic aneurysm. No dissection seen. Note that the contrast bolus for assessment for dissection is less than optimal, and dissection cannot be excluded is a differential consideration confidently on this study. There Is aortic atherosclerosis as well as foci of great vessel and coronary artery calcification. 2. Airspace consolidation consistent with pneumonia posterior right base. Underlying centrilobular and paraseptal emphysematous change with areas of scattered fibrosis. 3. No appreciable adenopathy. 4. Stable 5 mm nodular opacity left upper lobe anteriorly toward the apex. 5.  Gallbladder absent  Ct Head Wo Contrast 10/09/2017 1. Unchanged size of left gangliothalamic  intraparenchymal hematoma with decreased density of blood products consistent with aging hematoma. 2. Decreased volume of intraventricular blood without hydrocephalus.    Portable CXR 1 View 10/08/2017 1. No active disease.  No evidence of pneumonia or pulmonary edema. 2. Probable chronic interstitial lung disease.  Ct Head Wo Contrast 10/02/2017 Unchanged examination of intraparenchymal hematoma centered in the left thalamus and basal ganglia with intraventricular extension.   Ct Head Wo Contrast 09/30/2017 Essentially stable LEFT thalamic/basal ganglia hemorrhage with intraventricular extension. No increase in size, worsening shift, or hydrocephalus.   Ct Head Wo Contrast 09/29/2017 1. Increase size of hematoma within the left basal ganglia. Stable intraventricular hemorrhage with minimal redistribution to the occipital horn of right lateral ventricle.  2. Mild increase in local mass effect. Stable 4 mm left-to-right midline shift.   Ct Angio Head W Or Wo Contrast 09/29/2017 1. No acute or focal vascular lesion to explain the intracranial hemorrhage.  2. Atherosclerotic changes in the cavernous internal carotid arteries bilaterally with moderate stenoses relative to the more distal ICA termini, right greater than left.  3. Asymmetric narrowing of the distal left ICA lumen below the skull base suggesting a significant proximal stenosis.  4. No significant change in the parenchymal hemorrhage or intraventricular component.   Ct Head Code Stroke Wo Contrast 09/29/2017 1. 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  Hemorrhage measures 2.5 x 4.2 x 2.0 cm (estimated volume is 88 cm^3).  2. Intraventricular extension of hemorrhage into the left lateral ventricle to the foramen of Monro. There is early dilation of the left lateral ventricle concerning for developing hydrocephalus.  3. 4 mm left right midline shift.   Transthoracic Echocardiogram 10/05/2017 Study Conclusions - Left  ventricle: The cavity size was normal. Wall thickness was increased in a pattern of moderate LVH. Systolic function was normal. The estimated ejection fraction was in the range of 55% to 60%. Wall motion was normal; there were no regional wall motion abnormalities. Doppler parameters are consistent with abnormal left ventricular relaxation (grade 1 diastolic dysfunction). - Left atrium: The atrium was moderately dilated. Impressions:   No cardiac source of embolism was identified, but cannot be ruled out on the basis of this examination.  Bilateral Carotid Dopplers  10/05/2017 Final Interpretation: Right Carotid: Velocities in the right ICA are consistent with a 80-99% stenosis. Left Carotid: Velocities in the left ICA are consistent with a 80-99% stenosis. Vertebrals: Bilateral vertebral arteries demonstrate antegrade flow.  Bilateral LE Dopplers  10/10/2017 negative for DVT   PHYSICAL EXAM Afebrile. Head is nontraumatic. Neck is supple without bruit.Cardiac exam no murmur or gallop. Lungs are clear to auscultation. Mouth breather, less labored today. Distal pulses are well felt. No edema in LE, LE size the same. PEG site unremarkable, non-tender. Condom cath on. Neurological Exam : Awakes. Eyes open. Stays awake today. Globally aphasic. Not following commands. Left gaze deviation. Unable to look to the right past midline. Does not blink to threat on the right.  Right lower facial weakness. Motor system exam reveals spontaneous antigravity and purposeful left upper movement, left arm action tremor.  LLE moves spontaneously. Right upper extremity flicker  Withdraws bilateral lower extremity to pain. Right plantar upgoing left downgoing. Brisk reflexes. Sensation, coordination and gait not tested.   ASSESSMENT/PLAN [name] is a 72 y.o. male with history of coronary artery disease with previous MI, COPD with continued tobacco use, diabetes mellitus, hyperlipidemia, hypertension,  depression, and anxiety presenting with onset of right-sided weakness followed by unresponsiveness.  He did not receive IV t-PA due to ICH.  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  Resultant  Right hemiparesis, left gaze, global aphasia  CT head - Acute left basal ganglia ICH Stable x 3  CTA Head - No acute or focal vascular lesion to explain the ICH. Bilateral cavernous carotid moderate stenosis R>L.  Repeat CT head 6/3 resolving IVH, stable IPH  Carotid Doppler - Bilateral 80-99% stenosis  2D Echo -EF 55 to 60%.  No cardiac source of emboli identified  LE doppler negative for DVT  LDL - 37  HgbA1c - 8.7  VTE prophylaxis - Lovenox 40 mg sq daily   aspirin 81 mg daily and clopidogrel 75 mg daily prior to admission, now on No antithrombotic due to ICH  Ongoing aggressive stroke risk factor management  Therapy recommendations:  SNF (family works during the day and unable to provide 24/7 care following CIR)  Disposition:  Pending. Not medically ready for d/c today - will reassess for possible d/c Thurs or more likely Friday. Discussed with Marisue Ivan SW.  Carotid Stenosis  Severe B ICA stenosis 80-99%  Given hmg, unable to treat with antiplatelets  Not a surgical candidate at this time  AVOID HYPOTENSION  Dysphagia, secondary to stroke  NPO. Failed swallow.   s/p PEG placement 10/06/17  Site unremarkable. Tolerating  TFs  HOB up 30 degrees  Hypertensive Emergency . Treated initially with IV Cardene  . BP now 110-140s . SBP goal 130-160 given b/l ICA high grade stenosis . Remains on coreg to 12.5 bid, isosorbide mononitrate 10 bid  Hyperlipidemia  Lipid lowering medication PTA:  Lipitor 80 mg daily PTA  LDL 37, goal < 70  Hold off statin for now due to low level of LDL  Diabetes  HgbA1c 8.7, goal < 7.0  Uncontrolled  Continue to have hyperglycemia, but better 250-227-115-126-233  on glucerna 10/08/17  On lantus 30u  On  novolog 7u Q4h  On home meds of glipizide and metformin 6/2  SSI  CBG monitoring  Leukocytosis, Tachypnea  WBC 18.1->23.3->25.5->22.3  Low grade temp over night 100.5  CXR stable 10/04/17. CXR repeat 10/08/2017 negative.  UA trace leukocytes, no bacteria, WBC 0-6-10  UCx mult species, asked then to recollect  BCx pending   PEG site unremarkable  CT chest neg for PE, PNA R Base  Rocephin change to Zosyn 6/4  Other Stroke Risk Factors  Advanced age  Cigarette smoker will be advised to stop smoking  ETOH use, advised to drink no more than 1 alcoholic beverage per day.  Obesity, Body mass index is 38.94 kg/m., recommend weight loss, diet and exercise as appropriate  Coronary artery disease  Other Active Problems  Anxiety and depression history - consider resuming Zoloft Hypernatremia, increasing 149->153->152 AKI stable, BUN/Cr 50/1.09->63/1.2 68/1.17  Hospital day # 12  Annie Main, MSN, APRN, ANVP-BC, AGPCNP-BC Advanced Practice Stroke Nurse [facility] See Amion for Schedule & Pager information 10/11/2017 9:23 AM   ATTENDING NOTE: I reviewed above note and agree with the assessment and plan. I have made any additions or clarifications directly to the above note. Pt was seen and examined.   Patient seems more awake than yesterday, open eyes and able to maintain awake. However, still has mouth breathing with tachypnea. No significant neuro change overnight. CTA chest PE protocol yesterday no PE but right LL mild pneumonia. Put on zosyn for empiric treatment. DVT negative. Continue to have leukocytosis but WBC down from 25.5 yesterday to 22.3 today.  Sodium 153 -> 152, continue TF and free water.  Creatinine stable 1.2->1.17.  Glucose better controlled, still on insulin. Tmax 100.5. Since all parameters improved from yesterday, will continue current management. If pt further decline or no improvement, will call internal medicine for consultation.    Marvel Plan, MD PhD Stroke Neurology 10/11/2017 2:12 PM  To contact Stroke Continuity provider, please refer to [email or web address]. After hours, contact General Neurology

## 2017-10-11 NOTE — Plan of Care (Signed)
  Problem: Education: Goal: Knowledge of General Education information will improve Outcome: Progressing   Problem: Health Behavior/Discharge Planning: Goal: Ability to manage health-related needs will improve Outcome: Progressing   Problem: Clinical Measurements: Goal: Ability to maintain clinical measurements within normal limits will improve Outcome: Progressing Goal: Will remain free from infection Outcome: Progressing Goal: Diagnostic test results will improve Outcome: Progressing Goal: Respiratory complications will improve Outcome: Progressing Goal: Cardiovascular complication will be avoided Outcome: Progressing   Problem: Activity: Goal: Risk for activity intolerance will decrease Outcome: Progressing   Problem: Nutrition: Goal: Adequate nutrition will be maintained Outcome: Progressing   Problem: Pain Managment: Goal: General experience of comfort will improve Outcome: Progressing   Problem: Safety: Goal: Ability to remain free from injury will improve Outcome: Progressing   Problem: Skin Integrity: Goal: Risk for impaired skin integrity will decrease Outcome: Progressing   Problem: Education: Goal: Knowledge of disease or condition will improve Outcome: Progressing Goal: Knowledge of secondary prevention will improve Outcome: Progressing Goal: Knowledge of patient specific risk factors addressed and post discharge goals established will improve Outcome: Progressing   Problem: Coping: Goal: Will verbalize positive feelings about self Outcome: Progressing Goal: Will identify appropriate support needs Outcome: Progressing   Problem: Health Behavior/Discharge Planning: Goal: Ability to manage health-related needs will improve Outcome: Progressing   Problem: Self-Care: Goal: Ability to participate in self-care as condition permits will improve Outcome: Progressing Goal: Verbalization of feelings and concerns over difficulty with self-care will  improve Outcome: Progressing Goal: Ability to communicate needs accurately will improve Outcome: Progressing   Problem: Nutrition: Goal: Risk of aspiration will decrease Outcome: Progressing Goal: Dietary intake will improve Outcome: Progressing   Problem: Intracerebral Hemorrhage Tissue Perfusion: Goal: Complications of Intracerebral Hemorrhage will be minimized Outcome: Progressing   

## 2017-10-12 ENCOUNTER — Inpatient Hospital Stay (HOSPITAL_COMMUNITY): Payer: Medicare Other

## 2017-10-12 DIAGNOSIS — F17210 Nicotine dependence, cigarettes, uncomplicated: Secondary | ICD-10-CM

## 2017-10-12 DIAGNOSIS — I161 Hypertensive emergency: Secondary | ICD-10-CM

## 2017-10-12 DIAGNOSIS — E870 Hyperosmolality and hypernatremia: Secondary | ICD-10-CM

## 2017-10-12 DIAGNOSIS — I6529 Occlusion and stenosis of unspecified carotid artery: Secondary | ICD-10-CM | POA: Diagnosis present

## 2017-10-12 DIAGNOSIS — I252 Old myocardial infarction: Secondary | ICD-10-CM

## 2017-10-12 LAB — BASIC METABOLIC PANEL
Anion gap: 11 (ref 5–15)
BUN: 63 mg/dL — AB (ref 6–20)
CHLORIDE: 113 mmol/L — AB (ref 101–111)
CO2: 29 mmol/L (ref 22–32)
CREATININE: 1.13 mg/dL (ref 0.61–1.24)
Calcium: 9.6 mg/dL (ref 8.9–10.3)
GFR calc Af Amer: >60 mL/min (ref >60)
GFR calc non Af Amer: >60 mL/min (ref >60)
Glucose, Bld: 168 mg/dL — ABNORMAL HIGH (ref 65–99)
Potassium: 3.9 mmol/L (ref 3.5–5.1)
Sodium: 153 mmol/L — ABNORMAL HIGH (ref 135–145)

## 2017-10-12 LAB — CBC
HCT: 44.6 % (ref 39.0–52.0)
Hemoglobin: 14.2 g/dL (ref 13.0–17.0)
MCH: 32.6 pg (ref 26.0–34.0)
MCHC: 31.8 g/dL (ref 30.0–36.0)
MCV: 102.5 fL — AB (ref 78.0–100.0)
Platelets: 288 10*3/uL (ref 150–400)
RBC: 4.35 MIL/uL (ref 4.22–5.81)
RDW: 13.3 % (ref 11.5–15.5)
WBC: 21.1 10*3/uL — ABNORMAL HIGH (ref 4.0–10.5)

## 2017-10-12 LAB — GLUCOSE, CAPILLARY
GLUCOSE-CAPILLARY: 124 mg/dL — AB (ref 65–99)
GLUCOSE-CAPILLARY: 140 mg/dL — AB (ref 65–99)
Glucose-Capillary: 156 mg/dL — ABNORMAL HIGH (ref 65–99)
Glucose-Capillary: 143 mg/dL — ABNORMAL HIGH (ref 65–99)
Glucose-Capillary: 147 mg/dL — ABNORMAL HIGH (ref 65–99)

## 2017-10-12 MED ORDER — AMANTADINE HCL 50 MG/5ML PO SYRP
200.0000 mg | ORAL_SOLUTION | Freq: Two times a day (BID) | ORAL | Status: DC
  Administered 2017-10-13 (×2): 200 mg via ORAL
  Filled 2017-10-12 (×4): qty 20

## 2017-10-12 MED ORDER — INSULIN ASPART 100 UNIT/ML ~~LOC~~ SOLN
5.0000 [IU] | SUBCUTANEOUS | Status: DC
  Administered 2017-10-12 – 2017-10-13 (×8): 5 [IU] via SUBCUTANEOUS

## 2017-10-12 MED ORDER — FREE WATER
300.0000 mL | Status: DC
  Administered 2017-10-12 – 2017-10-13 (×11): 300 mL

## 2017-10-12 MED ORDER — CARVEDILOL 6.25 MG PO TABS
6.2500 mg | ORAL_TABLET | Freq: Two times a day (BID) | ORAL | Status: DC
  Administered 2017-10-12 – 2017-10-13 (×3): 6.25 mg via ORAL
  Filled 2017-10-12 (×3): qty 1

## 2017-10-12 NOTE — Progress Notes (Addendum)
Physical Therapy Treatment Patient Details Name: Dwayne MaterDennis Norrod MRN: [id number] DOB: 11/26/1945 Today's Date: 10/12/2017    History of Present Illness [id number] male s/p ICH L basal ganglia PMH: COPD, MI, CAD, HF    PT Comments    Patient was limited by lethargy this session and required total A +2 for bed mobility. Pt is able to hold onto bed rail with L UE with hand over hand assist for placement and releasing bed rail but pt did not demonstrate purposeful movements. Pt responded to noxious stimuli on L side and with minimal eye opening during session. PT will continue to follow acutely and progress as able within POC.     Follow Up Recommendations  SNF;Supervision/Assistance - 24 hour     Equipment Recommendations  Other (comment)(TBD)    Recommendations for Other Services       Precautions / Restrictions Precautions Precautions: Fall Restrictions Weight Bearing Restrictions: No    Mobility  Bed Mobility Overal bed mobility: Needs Assistance Bed Mobility: Rolling Rolling: Total assist;+2 for physical assistance         General bed mobility comments: pt required assistance for all aspects of bed mobility; pt did hold onto R side bed rail with L UE in sidelying with hand over hand assist to place hand on rail and to release grip from rail   Transfers                    Ambulation/Gait                 Stairs             Wheelchair Mobility    Modified Rankin (Stroke Patients Only) Modified Rankin (Stroke Patients Only) Pre-Morbid Rankin Score: No symptoms Modified Rankin: Severe disability     Balance Overall balance assessment: Needs assistance                                          Cognition Arousal/Alertness: Lethargic Behavior During Therapy: Flat affect Overall Cognitive Status: Difficult to assess                                 General Comments: pt with eyes open at times only with transitional  movements but does not demonstrate purposeful movements with cues       Exercises General Exercises - Upper Extremity Shoulder Flexion: PROM;10 reps;Supine;Both Elbow Flexion: PROM;10 reps;Supine;Both Other Exercises Other Exercises: PROM LE's    General Comments General comments (skin integrity, edema, etc.): pt with R LE tone and will flex hip/knee more as reflex and not to commands       Pertinent Vitals/Pain Pain Assessment: Faces Pain Location: grimacing to noxious stimuli    Home Living                      Prior Function            PT Goals (current goals can now be found in the care plan section) Acute Rehab PT Goals PT Goal Formulation: With family Time For Goal Achievement: 10/11/17 Potential to Achieve Goals: Fair Progress towards PT goals: Not progressing toward goals - comment(lethargic)    Frequency    Min 2X/week      PT Plan Frequency needs to be updated;Current plan remains appropriate  Co-evaluation              AM-PAC PT "6 Clicks" Daily Activity  Outcome Measure  Difficulty turning over in bed (including adjusting bedclothes, sheets and blankets)?: Unable Difficulty moving from lying on back to sitting on the side of the bed? : Unable Difficulty sitting down on and standing up from a chair with arms (e.g., wheelchair, bedside commode, etc,.)?: Unable Help needed moving to and from a bed to chair (including a wheelchair)?: Total Help needed walking in hospital room?: Total Help needed climbing 3-5 steps with a railing? : Total 6 Click Score: 6    End of Session Equipment Utilized During Treatment: Oxygen Activity Tolerance: Patient limited by lethargy Patient left: in bed;with call bell/phone within reach;with bed alarm set;with SCD's reapplied Nurse Communication: Mobility status PT Visit Diagnosis: Other abnormalities of gait and mobility (R26.89);Other symptoms and signs involving the nervous system (R29.898);Hemiplegia  and hemiparesis Hemiplegia - Right/Left: Right Hemiplegia - dominant/non-dominant: Dominant Hemiplegia - caused by: Cerebral infarction     Time: 1021-1046 PT Time Calculation (min) (ACUTE ONLY): 25 min  Charges:  $Therapeutic Activity: 23-37 mins                    G Codes:       Erline Levine, PTA Pager: [phone]     Carolynne Edouard 10/12/2017, 1:50 PM

## 2017-10-12 NOTE — Progress Notes (Addendum)
STROKE TEAM PROGRESS NOTE   SUBJECTIVE (INTERVAL HISTORY) No family present. No longer mouth breathing or tachypnic. GLucose stabilizing. Cr slightly improved. WBC decreasing.    OBJECTIVE Temp:  [97.4 F (36.3 C)-99.1 F (37.3 C)] 97.4 F (36.3 C) (06/06 0836) Pulse Rate:  [68-85] 76 (06/06 0836) Resp:  [16-24] 24 (06/06 0836) BP: (99-142)/(53-76) 138/67 (06/06 0836) SpO2:  [9 %-96 %] 95 % (06/06 0836) Weight:  [122.5 kg (270 lb 1 oz)] 122.5 kg (270 lb 1 oz) (06/06 0343)  CBC:  Recent Labs  Lab 10/07/17 0333 10/08/17 0541  10/11/17 0731 10/12/17 0425  WBC 16.6* 18.1*   < > 22.3* 21.1*  NEUTROABS 12.1* 12.9*  --   --   --   HGB 14.5 14.7   < > 14.6 14.2  HCT 43.6 44.9   < > 44.3 44.6  MCV 98.9 99.1   < > 100.2* 102.5*  PLT 317 321   < > 309 288   < > = values in this interval not displayed.    Basic Metabolic Panel:  Recent Labs  Lab 10/11/17 0731 10/12/17 0425  NA 152* 153*  K 4.3 3.9  CL 111 113*  CO2 29 29  GLUCOSE 210* 168*  BUN 68* 63*  CREATININE 1.17 1.13  CALCIUM 9.8 9.6   IMAGING CXR 10/12/2017 Stable increased markings in the lung bases, atelectasis versus scarring.  Ct Angio Chest PE w/ w/o Contrast 10/10/2017 1. No demonstrable pulmonary embolus. No thoracic aortic aneurysm. No dissection seen. Note that the contrast bolus for assessment for dissection is less than optimal, and dissection cannot be excluded is a differential consideration confidently on this study. There Is aortic atherosclerosis as well as foci of great vessel and coronary artery calcification. 2. Airspace consolidation consistent with pneumonia posterior right base. Underlying centrilobular and paraseptal emphysematous change with areas of scattered fibrosis. 3. No appreciable adenopathy. 4. Stable 5 mm nodular opacity left upper lobe anteriorly toward the apex. 5.  Gallbladder absent  Ct Head Wo Contrast 10/09/2017 1. Unchanged size of left gangliothalamic intraparenchymal  hematoma with decreased density of blood products consistent with aging hematoma. 2. Decreased volume of intraventricular blood without hydrocephalus.    Portable CXR 1 View 10/08/2017 1. No active disease.  No evidence of pneumonia or pulmonary edema. 2. Probable chronic interstitial lung disease.  Ct Head Wo Contrast 10/02/2017 Unchanged examination of intraparenchymal hematoma centered in the left thalamus and basal ganglia with intraventricular extension.   Ct Head Wo Contrast 09/30/2017 Essentially stable LEFT thalamic/basal ganglia hemorrhage with intraventricular extension. No increase in size, worsening shift, or hydrocephalus.   Ct Head Wo Contrast 09/29/2017 1. Increase size of hematoma within the left basal ganglia. Stable intraventricular hemorrhage with minimal redistribution to the occipital horn of right lateral ventricle.  2. Mild increase in local mass effect. Stable 4 mm left-to-right midline shift.   Ct Angio Head W Or Wo Contrast 09/29/2017 1. No acute or focal vascular lesion to explain the intracranial hemorrhage.  2. Atherosclerotic changes in the cavernous internal carotid arteries bilaterally with moderate stenoses relative to the more distal ICA termini, right greater than left.  3. Asymmetric narrowing of the distal left ICA lumen below the skull base suggesting a significant proximal stenosis.  4. No significant change in the parenchymal hemorrhage or intraventricular component.   Ct Head Code Stroke Wo Contrast 09/29/2017 1. 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 Hemorrhage measures 2.5 x 4.2 x 2.0 cm (estimated volume is 88  cm^3).  2. Intraventricular extension of hemorrhage into the left lateral ventricle to the foramen of [place]ro. There is early dilation of the left lateral ventricle concerning for developing hydrocephalus.  3. 4 mm left right midline shift.   Transthoracic Echocardiogram 10/05/2017 Study Conclusions - Left ventricle: The cavity  size was normal. Wall thickness was increased in a pattern of moderate LVH. Systolic function was normal. The estimated ejection fraction was in the range of 55% to 60%. Wall motion was normal; there were no regional wall motion abnormalities. Doppler parameters are consistent with abnormal left ventricular relaxation (grade 1 diastolic dysfunction). - Left atrium: The atrium was moderately dilated. Impressions:   No cardiac source of embolism was identified, but cannot be ruled out on the basis of this examination.  Bilateral Carotid Dopplers  10/05/2017 Final Interpretation: Right Carotid: Velocities in the right ICA are consistent with a 80-99% stenosis. Left Carotid: Velocities in the left ICA are consistent with a 80-99% stenosis. Vertebrals: Bilateral vertebral arteries demonstrate antegrade flow.  Bilateral LE Dopplers  10/10/2017 negative for DVT   PHYSICAL EXAM Afebrile. Head is nontraumatic. Neck is supple without bruit.Cardiac exam no murmur or gallop. Lungs are clear to auscultation. No longer with labored respiratory mouth breathing, no tachypnea - RR 20-22 Distal pulses are well felt. No edema in LE, LE size the same. PEG site unremarkable, non-tender. Condom cath on. Neurological Exam : Awakes. Eyes open. Stays awake today. Globally aphasic. Not following commands. Eyes midline, unable to look to the right past midline. Does not blink to threat on the right.  Right lower facial weakness. Motor system exam reveals spontaneous antigravity and purposeful left upper movement, left arm action tremor.  LLE moves spontaneously. Right upper extremity flicker  Withdraws bilateral lower extremity to pain. Right plantar more equivocal upgoing, left downgoing. Brisk reflexes. Sensation, coordination and gait not tested.   ASSESSMENT/PLAN Dwayne [name] is a 72 y.o. [name] with history of coronary artery disease with previous MI, COPD with continued tobacco use, diabetes mellitus,  hyperlipidemia, hypertension, depression, and anxiety presenting with onset of right-sided weakness followed by unresponsiveness.  He did not receive IV t-PA due to [place].  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  Resultant  Right hemiparesis, left gaze, global aphasia  CT head - Acute left basal ganglia ICH Stable x 3  CTA Head - No acute or focal vascular lesion to explain the ICH. Bilateral cavernous carotid moderate stenosis R>L.  Repeat CT head 6/3 resolving IVH, stable IPH  Carotid Doppler - Bilateral 80-99% stenosis  2D Echo -EF 55 to 60%.  No cardiac source of emboli identified  LE doppler negative for DVT  LDL - 37  HgbA1c - 8.7  VTE prophylaxis - Lovenox 40 mg sq daily   aspirin 81 mg daily and clopidogrel 75 mg daily prior to admission, now on No antithrombotic due to [place]  Ongoing aggressive stroke risk factor management  Therapy recommendations:  SNF (family works during the day and unable to provide 24/7 care following CIR)  Disposition:  Pending. Not medically ready for d/c today - plan d/c Friday. Discussed with Kelli CM.  Carotid Stenosis  Severe B ICA stenosis 80-99%  Given hmg, unable to treat with antiplatelets  Not a surgical candidate at this time  AVOID HYPOTENSION  Lower BP, meds ajdusted  Dysphagia, secondary to stroke  NPO. Failed swallow.   s/p PEG placement 10/06/17  Site unremarkable. Tolerating TFs  HOB up 30 degrees  Hypertensive Emergency . Treated initially with IV Cardene  . BP as low as 99/56 overnight . SBP goal 130-160 given b/l ICA high grade stenosis . on coreg to 12.5 bid, isosorbide mononitrate 10 bid . Decrease coreg to 6.25 mg bid  Hyperlipidemia  Lipid lowering medication PTA:  Lipitor 80 mg daily PTA  LDL 37, goal < 70  Hold off statin for now due to low level of LDL  Diabetes  HgbA1c 8.7, goal < 7.0  Glucoses normalizing 143 - 156 - 147 - 110  on glucerna 10/08/17  On  lantus 30u - will not increase  On novolog 7u Q4h - decrease to 5u q4h  On home meds of glipizide and metformin 6/2  SSI  CBG monitoring  Leukocytosis, Tachypnea  WBC 18.1->23.3->25.5->22.3->21.1  Temp normalized 99.1  CXR stable 10/04/17. CXR repeat 10/08/2017 and 10/12/2017 negative.  UA trace leukocytes, no bacteria, WBC 0-6-10  UCx mult species, repeat UCx 30K coag neg staph   BCx no growth  PEG site unremarkable  CT chest neg for PE, PNA R Base  Rocephin change to Zosyn 6/4  Other Stroke Risk Factors  Advanced age  Cigarette smoker will be advised to stop smoking  ETOH use, advised to drink no more than 1 alcoholic beverage per day.  Obesity, Body mass index is 38.75 kg/m., recommend weight loss, diet and exercise as appropriate  Coronary artery disease  Other Active Problems  Anxiety and depression history - consider resuming Zoloft Hypernatremia, increasing 149->153->152-> 153 Increase free water to 300 q 3h AKI stable, BUN/Cr 50/1.09->63/1.2 ->68/1.17->63/1.13  Hospital day # Bell, MSN, APRN, ANVP-BC, AGPCNP-BC Advanced Practice Stroke Nurse [place] for Schedule & Pager information 10/12/2017 9:29 AM   ATTENDING NOTE: I reviewed above note and agree with the assessment and plan. I have made any additions or clarifications directly to the above note. Pt was seen and examined.  Met pt wife and daughter at the bedside. Pt mental status slightly better than yesterday.  Still fluctuating, at times more awake alert, able to thumbs up for RN, but at times was sleepy drowsy.  However less mouth breathing, no more tachypnea.  Still has left gaze preference and right hemiplegia.  Sodium still high 153, kidney function normal, leukocytosis improved some, WBC down from 22.3-21.1.  Afebrile.  Glucose 120s to 150s.  Decrease of NovoLog dose.  Increase free water due to high sodium level.  BP stable. Avoid hypotension due to b/l  carotid stenosis. Added amantadine 200 mg twice daily for arousal.  I had long discussion with wife and daughter at bedside, updated pt current condition, treatment plan and potential prognosis.  Patient likely need  SNF placement early next week.  They expressed understanding and appreciation.   Rosalin Hawking, MD PhD Stroke Neurology 10/12/2017 5:22 PM    To contact Stroke Continuity provider, please refer to [email or web address] After hours, contact General Neurology

## 2017-10-12 NOTE — Discharge Summary (Addendum)
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 Active Problems:   Essential hypertension   Depression   Advance directive discussed with patient   Anxiety   HLD (hyperlipidemia)   History of myocardial infarction   Presence of stent in coronary artery   Acute respiratory failure (HCC)   Leukocytosis   Dysphagia, post-stroke   Status post insertion of percutaneous endoscopic gastrostomy (PEG) tube (HCC)   Chronic obstructive pulmonary disease (HCC)   Diastolic dysfunction   Chronic bilateral low back pain   Diabetes mellitus type 2 in obese (HCC)   AKI (acute kidney injury) (HCC)   Carotid stenosis, bilateral   Hypertensive emergency   Cigarette smoker   Hypernatremia   Past Medical History:  Diagnosis Date  . Anxiety   . Aortic atherosclerosis (HCC) 08/14/2017  . Arthritis    "knees, elbows, hands" (02/12/2014)  . Compression fracture of L2 (HCC) 03/08/2013  . COPD (chronic obstructive pulmonary disease) (HCC)   . Coronary artery disease    Dr. Delton See, [facility]- follows  . Depression   . Esophageal reflux   . Gallstones   . Hyperlipidemia   . Hypertension, benign    Benign  . 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 Myocardial infarction (HCC) 1998 X 2   "probably a couple since 1998" (02/12/2014)  . Palpitations   . Pneumonia ~ 2004 X 1  . Swallowing difficulty    intermittent-occ"doesn't matter with  solid or liquid"   Past Surgical History:  Procedure Laterality Date  . CARDIAC CATHETERIZATION  ~ 2012  . CATARACT EXTRACTION W/ INTRAOCULAR LENS IMPLANT Right 10/2013  . CHOLECYSTECTOMY N/A 03/07/2016   Procedure: LAPAROSCOPIC CHOLECYSTECTOMY WITH INTRAOPERATIVE CHOLANGIOGRAM;  Surgeon: Glenna Fellows, MD;  Location: [facility] ORS;  Service: General;  Laterality: N/A;  . CORONARY ANGIOPLASTY  1998  . CORONARY ANGIOPLASTY WITH STENT PLACEMENT  1999; 2014; 02/12/2014   "1; 2; 1"  . ESOPHAGOGASTRODUODENOSCOPY (EGD) WITH PROPOFOL N/A 10/06/2017   Procedure: ESOPHAGOGASTRODUODENOSCOPY (EGD) WITH PROPOFOL;  Surgeon: Jimmye Norman, MD;  Location: [facility] ENDOSCOPY;  Service: General;  Laterality: N/A;  . LEFT HEART CATHETERIZATION WITH CORONARY ANGIOGRAM N/A 02/12/2014   Procedure: LEFT HEART CATHETERIZATION WITH CORONARY ANGIOGRAM;  Surgeon: Lesleigh Noe, MD;  Location: [facility] CATH LAB;  Service: Cardiovascular;  Laterality: N/A;  . PEG PLACEMENT N/A 10/06/2017   Procedure: PERCUTANEOUS ENDOSCOPIC GASTROSTOMY (PEG) PLACEMENT;  Surgeon: Jimmye Norman, MD;  Location: [facility] ENDOSCOPY;  Service: General;  Laterality: N/A;    Allergies as of 10/13/2017      Reactions   Morphine And Related Anaphylaxis   Swelling of the tongue and face      Medication List    STOP taking these medications   acetaminophen 500 MG tablet Commonly known as:  TYLENOL   albuterol 108 (90 Base) MCG/ACT inhaler Commonly known as:  PROVENTIL HFA;VENTOLIN HFA   aspirin 81 MG chewable tablet   atorvastatin 80 MG tablet Commonly  known as:  LIPITOR   clonazePAM 0.5 MG tablet Commonly known as:  KLONOPIN   clopidogrel 75 MG tablet Commonly known as:  PLAVIX   furosemide 20 MG tablet Commonly known as:  LASIX   hydrochlorothiazide 25 MG tablet Commonly known as:  HYDRODIURIL   isosorbide mononitrate 30 MG 24 hr tablet Commonly known as:  IMDUR Replaced by:  isosorbide mononitrate 10 MG tablet   lisinopril 40 MG  tablet Commonly known as:  PRINIVIL,ZESTRIL   nitroGLYCERIN 0.4 MG SL tablet Commonly known as:  NITROSTAT   pantoprazole 40 MG tablet Commonly known as:  PROTONIX   PROBIOTIC PO     TAKE these medications   amantadine 50 MG/5ML solution Commonly known as:  SYMMETREL Take 20 mLs (200 mg total) by mouth 2 (two) times daily.   amoxicillin-clavulanate 875-125 MG tablet Commonly known as:  AUGMENTIN Take 1 tablet by mouth every 12 (twelve) hours.   carvedilol 6.25 MG tablet Commonly known as:  COREG Take 1 tablet (6.25 mg total) by mouth 2 (two) times daily with a meal.   famotidine 20 MG tablet Commonly known as:  PEPCID Take 1 tablet (20 mg total) by mouth 2 (two) times daily.   feeding supplement (GLUCERNA 1.2 CAL) Liqd Place 1,000 mLs into feeding tube continuous.   feeding supplement (PRO-STAT SUGAR FREE 64) Liqd Place 60 mLs into feeding tube 2 (two) times daily.   free water Soln Place 300 mLs into feeding tube every 3 (three) hours.   GLIPIZIDE XL 10 MG 24 hr tablet Generic drug:  glipiZIDE Take 10 mg by mouth daily.   insulin aspart 100 UNIT/ML injection Commonly known as:  novoLOG Inject 5 Units into the skin every 4 (four) hours.   insulin glargine 100 UNIT/ML injection Commonly known as:  LANTUS Inject 0.3 mLs (30 Units total) into the skin at bedtime.   isosorbide mononitrate 10 MG tablet Commonly known as:  ISMO,MONOKET Take 1 tablet (10 mg total) by mouth 2 (two) times daily. Replaces:  isosorbide mononitrate 30 MG 24 hr tablet   metFORMIN 1000 MG tablet Commonly known as:  GLUCOPHAGE Take 1 tablet (1,000 mg total) by mouth 2 (two) times daily with a meal.   sertraline 100 MG tablet Commonly known as:  ZOLOFT Take 200 mg by mouth at bedtime.       LABORATORY STUDIES CBC    Component Value Date/Time   WBC 15.2 (H) 10/13/2017 0543   RBC 4.18 (L) 10/13/2017 0543   HGB 13.6 10/13/2017 0543   HCT 42.0 10/13/2017 0543   PLT 300 10/13/2017  0543   MCV 100.5 (H) 10/13/2017 0543   MCH 32.5 10/13/2017 0543   MCHC 32.4 10/13/2017 0543   RDW 13.1 10/13/2017 0543   LYMPHSABS 3.1 10/08/2017 0541   MONOABS 1.9 (H) 10/08/2017 0541   EOSABS 0.1 10/08/2017 0541   BASOSABS 0.1 10/08/2017 0541   CMP    Component Value Date/Time   NA 148 (H) 10/13/2017 0543   NA 137 08/02/2016 1317   K 3.7 10/13/2017 0543   CL 111 10/13/2017 0543   CO2 29 10/13/2017 0543   GLUCOSE 168 (H) 10/13/2017 0543   BUN 54 (H) 10/13/2017 0543   BUN 18 08/02/2016 1317   CREATININE 1.10 10/13/2017 0543   CALCIUM 9.1 10/13/2017 0543   PROT 7.8 09/29/2017 1053   ALBUMIN 4.0 09/29/2017 1053   AST 17 09/29/2017 1053   ALT 17 09/29/2017 1053   ALKPHOS 62 09/29/2017 1053  BILITOT 1.0 09/29/2017 1053   GFRNONAA >60 10/13/2017 0543   GFRAA >60 10/13/2017 0543   COAGS Lab Results  Component Value Date   INR 0.97 09/29/2017   INR 1.0 02/06/2014   INR 0.89 01/10/2014   Lipid Panel    Component Value Date/Time   CHOL 118 10/01/2017 0214   TRIG 111 10/02/2017 0505   HDL 62 10/01/2017 0214   CHOLHDL 1.9 10/01/2017 0214   VLDL 19 10/01/2017 0214   LDLCALC 37 10/01/2017 0214   HgbA1C  Lab Results  Component Value Date   HGBA1C 8.7 (H) 09/29/2017   Urinalysis    Component Value Date/Time   COLORURINE YELLOW 10/08/2017 1711   APPEARANCEUR HAZY (A) 10/08/2017 1711   LABSPEC 1.024 10/08/2017 1711   PHURINE 5.0 10/08/2017 1711   GLUCOSEU 50 (A) 10/08/2017 1711   HGBUR SMALL (A) 10/08/2017 1711   BILIRUBINUR NEGATIVE 10/08/2017 1711   KETONESUR NEGATIVE 10/08/2017 1711   PROTEINUR 30 (A) 10/08/2017 1711   NITRITE NEGATIVE 10/08/2017 1711   LEUKOCYTESUR TRACE (A) 10/08/2017 1711   Urine Drug Screen     Component Value Date/Time   LABOPIA NONE DETECTED 10/01/2017 0715   COCAINSCRNUR NONE DETECTED 10/01/2017 0715   LABBENZ NONE DETECTED 10/01/2017 0715   AMPHETMU NONE DETECTED 10/01/2017 0715   THCU NONE DETECTED 10/01/2017 0715   LABBARB  NONE DETECTED 10/01/2017 0715    Alcohol Level    Component Value Date/Time   ETH <10 09/29/2017 1053    SIGNIFICANT DIAGNOSTIC STUDIES CXR 10/12/2017 Stable increased markings in the lung bases, atelectasis versus scarring.  Ct Angio Chest PE w/ w/o Contrast 10/10/2017 1. No demonstrable pulmonary embolus. No thoracic aortic aneurysm. No dissection seen. Note that the contrast bolus for assessment for dissection is less than optimal, and dissection cannot be excluded is a differential consideration confidently on this study. There Is aortic atherosclerosis as well as foci of great vessel and coronary artery calcification. 2. Airspace consolidation consistent with pneumonia posterior right base. Underlying centrilobular and paraseptal emphysematous change with areas of scattered fibrosis. 3. No appreciable adenopathy. 4. Stable 5 mm nodular opacity left upper lobe anteriorly toward the apex. 5. Gallbladder absent  Ct Head Wo Contrast 10/09/2017 1. Unchanged size of left gangliothalamic intraparenchymal hematoma with decreased density of blood products consistent with aging hematoma. 2. Decreased volume of intraventricular blood without hydrocephalus.    Portable CXR 1 View 10/08/2017 1. No active disease. No evidence of pneumonia or pulmonary edema. 2. Probable chronic interstitial lung disease.  Ct Head Wo Contrast 10/02/2017 Unchanged examination of intraparenchymal hematoma centered in the left thalamus and basal ganglia with intraventricular extension.   Ct Head Wo Contrast 09/30/2017 Essentially stable LEFT thalamic/basal ganglia hemorrhage with intraventricular extension. No increase in size, worsening shift, or hydrocephalus.   Ct Head Wo Contrast 09/29/2017 1. Increase size of hematoma within the left basal ganglia. Stable intraventricular hemorrhage with minimal redistribution to the occipital horn of right lateral ventricle.  2. Mild increase in local mass effect.  Stable 4 mm left-to-right midline shift.   Ct Angio Head W Or Wo Contrast 09/29/2017 1. No acute or focal vascular lesion to explain the intracranial hemorrhage.  2. Atherosclerotic changes in the cavernous internal carotid arteries bilaterally with moderate stenoses relative to the more distal ICA termini, right greater than left.  3. Asymmetric narrowing of the distal left ICA lumen below the skull base suggesting a significant proximal stenosis.  4. No significant change in the parenchymal hemorrhage or intraventricular  component.   Ct Head Code Stroke Wo Contrast 09/29/2017 1. 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 Hemorrhage measures 2.5 x 4.2 x 2.0 cm (estimated volume is 88 cm^3).  2. Intraventricular extension of hemorrhage into the left lateral ventricle to the foramen of Monro. There is early dilation of the left lateral ventricle concerning for developing hydrocephalus.  3. 4 mm left right midline shift.   Transthoracic Echocardiogram 10/05/2017 Study Conclusions - Left ventricle: The cavity size was normal. Wall thickness wasincreased in a pattern of moderate LVH. Systolic function wasnormal. The estimated ejection fraction was in the range of 55%to 60%. Wall motion was normal; there were no regional wallmotion abnormalities. Doppler parameters are consistent withabnormal left ventricular relaxation (grade 1 diastolicdysfunction). - Left atrium: The atrium was moderately dilated. Impressions:   No cardiac source of embolism was identified, but cannot be ruled out on the basis of this examination.  Bilateral Carotid Dopplers  10/05/2017 Final Interpretation: Right Carotid: Velocities in the right ICA are consistent with a 80-99% stenosis. Left Carotid: Velocities in the left ICA are consistent with a 80-99% stenosis. Vertebrals: Bilateral vertebral arteries demonstrate antegrade flow.  Bilateral LE Dopplers  10/10/2017 negative for DVT     HISTORY OF  PRESENT ILLNESS Maurine MinisterDennis Hoardis an 72 y.o.malewho has an extensive medical history listed below and is on Plavix and ASA, but no anticoagulation. He was in his usual state of health (MRS 3 for using wlker), when family noted him to have sudden right side weakness. They called EMS and he was very hypertensive with SBP in 200's per PM report. He declined quickly and was not responding upon arrival to ER, GCS 9. Upon emergent bedside evaluation in CT he was noted to have a large left BG/thalamus ICH with IVH extension, volume ~30cc. ICH score 4. He will be admitted for further neurologic care.    HOSPITAL COURSE Mr. Olena MaterDennis Swim is a 72 y.o. male with history of coronary artery disease with previous MI, COPD with continued tobacco use, diabetes mellitus, hyperlipidemia, hypertension, depression, and anxiety presenting with onset of right-sided weakness followed by unresponsiveness.  He did not receive IV t-PA due to ICH.  Due to decreased mental status, he was intubated and admitted to the neuro intensive care unit.  Neurosurgery was consulted but not felt to be a surgical candidate.  Critical care medicine assisted with ventilatory management.  Initial thoughts were he would need tracheostomy though he was eventually extubated wit family making decision not to reintubate should he need it.  Trauma was consulted for PEG placement as he was unable to swallow.  He was then transferred to the floor, following simple commands, with plans for discharge to rehab.  On the floor he became somnolent with mouth breathing, and unable to awaken.  White blood cells increased to ~25 with one-time low-grade temp 100.5.  No real source of infection was found though rhe CT of the chest done while looking for pulmonary embolus did reveal a tiny amount of aspiration pneumonia in the lung base. He was placed on antibiotics empirically.  Within 3 days he was improving, more awake, less lethargic, interactive.  He was discharged to  skilled nursing facility as 24/7 home care was unavailable.  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  Resultant  Right hemiparesis, left gaze, global aphasia  CT head - Acute left basal ganglia ICH Stable x 3  CTA Head - No acute or focal vascular lesion to explain  the ICH. Bilateral cavernous carotid moderate stenosis R>L.  Repeat CT head 6/3 resolving IVH, stable IPH  Carotid Doppler - Bilateral 80-99% stenosis  2D Echo -EF 55 to 60%.  No cardiac source of emboli identified  LE doppler negative for DVT  LDL - 37  HgbA1c - 8.7  aspirin 81 mg daily and clopidogrel 75 mg daily prior to admission, now on No antithrombotic due to ICH  amantadine added to increase alertness  Ongoing aggressive stroke risk factor management  Therapy recommendations:  SNF (family works during the day and unable to provide 24/7 care following CIR)  Disposition:  SNF  Carotid Stenosis  Severe B ICA stenosis 80-99%  Given hmg, unable to treat with antiplatelets  Not a surgical candidate at this time  AVOID HYPOTENSION  Lower BP, meds ajdusted  Dysphagia, secondary to stroke  NPO. Failed swallow.   s/p PEG placement 10/06/17  Site unremarkable. Tolerating TFs  HOB up 30 degrees  Transition to bolus feeds as becomes more stable  Hypertensive Emergency  Treated initially with IV Cardene   SBP goal 130-160 given b/l ICA high grade stenosis  on coreg to 6.25 bid, isosorbide mononitrate 10 bid  BP within goal  Hyperlipidemia  Lipid lowering medication PTA:  Lipitor 80 mg daily PTA  LDL 37, goal < 70  Hold off statin for now due to low level of LDL  Recheck lipids and reassess lipitor as followup  Diabetes type II  HgbA1c 8.7, goal < 7.0  Glucoses normalizing 150-143-172-140  on glucerna 10/08/17  On lantus 30u - will not increase  On novolog 7u Q4h - decrease to 5u q4h  On home meds of glipizide and metformin 6/2  SSI  CBG  monitoring  Leukocytosis, Tachypnea  WBC 18.1->23.3->25.5->22.3->21.1->15.2  Temp normalized 98.7  CXR stable 10/04/17. CXR repeat 10/08/2017 and 10/12/2017 negative.  UA trace leukocytes, no bacteria, WBC 0-6-10  UCx mult species, repeat UCx 30K coag neg staph   x no growth  PEG site unremarkable  CT chest neg for PE, small PNA R Base  Rocephin change to Zosyn 6/4 - D 4 today. Complete 7 day abx course with augmentin 875 mg bid  Other Stroke Risk Factors  Advanced age  Cigarette smoker will be advised to stop smoking  ETOH use, advised to drink no more than 1 alcoholic beverage per day.  Obesity, Body mass index is 38.75 kg/m., recommend weight loss, diet and exercise as appropriate  Coronary artery disease s/p stent  Other Active Problems   Anxiety and depression history - consider resuming Zoloft  Hypernatremia, increasing 149->153-> 148. on free water 300 q 3h  AKI stable, BUN/Cr 54/1.10  DISCHARGE EXAM Blood pressure 139/72, pulse 74, temperature 98.6 F (37 C), temperature source Axillary, resp. rate 20, height 5\' 10"  (1.778 m), weight 114.4 kg (252 lb 3.3 oz), SpO2 99 %. Afebrile. Head is nontraumatic. Neck is supple without bruit.Cardiac exam no murmur or gallop. Lungs are clear to auscultation. No longer with labored respiratory mouth breathing, no tachypnea. Distal pulses are well felt. No edema in LE, LE size the same. PEG site unremarkable, non-tender. Condom cath on. Neurological Exam : Awake, alert, eyes open. Stays awake today. Will socially acknowledge presence or jokes with head nod or wink. Globally aphasic. No speech, not following commands (though did prior to move to floor). Eyes midline, unable to look to the right past midline. Does not blink to threat on the right.  Right lower facial weakness. Motor system  exam reveals spontaneous antigravity and purposeful left upper movement, left arm action tremor.  LLE moves spontaneously. Right upper  extremity flicker  Withdraws bilateral lower extremity to pain. Right plantar more equivocal upgoing, left downgoing. Brisk reflexes. Sensation, coordination and gait not tested.  Discharge Diet   NPO, tube feedings  DISCHARGE PLAN  Disposition:  Skilled nursing facility for ongoing PT, OT and ST.   Due to hemorrhage and risk of bleeding, do not take aspirin, aspirin-containing medications, or ibuprofen products   Ongoing risk factor control by Primary Care Physician at time of discharge  Follow-up Georgianne Fick, MD or MD at SNF in 2 weeks.  Follow-up in [facility] in 4 weeks, office to schedule an appointment.   40 minutes were spent preparing discharge.  Annie Main, MSN, APRN, ANVP-BC, AGPCNP-BC Advanced Practice Stroke Nurse [facility] See Amion for Schedule & Pager information 10/13/2017 2:20 PM    ATTENDING NOTE: I reviewed above note and agree with the assessment and plan. I have made any additions or clarifications directly to the above note. Pt was seen and examined.   Pt mental status much improved, more interactive. Na down to 148 with free water supplement. WBC also trending down to 15.2. Glucose stable with current insulin and po meds regimen. Cre normalized. Still has left gaze, global aphasia and right hemiplegia. Needs rehab. Feel ready for SNF placement and start rehab. Wife in agreement. Plan to d/c to SNF later today.   Marvel Plan, MD PhD Stroke Neurology 10/13/2017 5:55 PM

## 2017-10-12 NOTE — Care Management Note (Signed)
Case Management Note  Patient Details  Name: Olena MaterDennis Cinco MRN: [id number] Date of Birth: 09/03/1945  Subjective/Objective:                    Action/Plan: [facility]. CM following.  Expected Discharge Date:                  Expected Discharge Plan:  Skilled Nursing Facility  In-House Referral:  Clinical Social Work  Discharge planning Services  CM Consult  Post Acute Care Choice:  NA Choice offered to:  NA  DME Arranged:  N/A DME Agency:  NA  HH Arranged:  NA HH Agency:  NA  Status of Service:  Completed, signed off  If discussed at Long Length of Stay Meetings, dates discussed:    Additional Comments:  Kermit BaloKelli F Nyah Shepherd, RN 10/12/2017, 1:41 PM

## 2017-10-13 DIAGNOSIS — Z955 Presence of coronary angioplasty implant and graft: Secondary | ICD-10-CM

## 2017-10-13 LAB — BASIC METABOLIC PANEL
ANION GAP: 8 (ref 5–15)
BUN: 54 mg/dL — ABNORMAL HIGH (ref 6–20)
CALCIUM: 9.1 mg/dL (ref 8.9–10.3)
CO2: 29 mmol/L (ref 22–32)
Chloride: 111 mmol/L (ref 101–111)
Creatinine, Ser: 1.1 mg/dL (ref 0.61–1.24)
GFR calc non Af Amer: >60 mL/min (ref >60)
Glucose, Bld: 168 mg/dL — ABNORMAL HIGH (ref 65–99)
Potassium: 3.7 mmol/L (ref 3.5–5.1)
Sodium: 148 mmol/L — ABNORMAL HIGH (ref 135–145)

## 2017-10-13 LAB — CBC
HCT: 42 % (ref 39.0–52.0)
HEMOGLOBIN: 13.6 g/dL (ref 13.0–17.0)
MCH: 32.5 pg (ref 26.0–34.0)
MCHC: 32.4 g/dL (ref 30.0–36.0)
MCV: 100.5 fL — ABNORMAL HIGH (ref 78.0–100.0)
Platelets: 300 10*3/uL (ref 150–400)
RBC: 4.18 MIL/uL — AB (ref 4.22–5.81)
RDW: 13.1 % (ref 11.5–15.5)
WBC: 15.2 10*3/uL — AB (ref 4.0–10.5)

## 2017-10-13 LAB — GLUCOSE, CAPILLARY
GLUCOSE-CAPILLARY: 143 mg/dL — AB (ref 65–99)
GLUCOSE-CAPILLARY: 150 mg/dL — AB (ref 65–99)
Glucose-Capillary: 117 mg/dL — ABNORMAL HIGH (ref 65–99)
Glucose-Capillary: 133 mg/dL — ABNORMAL HIGH (ref 65–99)
Glucose-Capillary: 172 mg/dL — ABNORMAL HIGH (ref 65–99)

## 2017-10-13 LAB — URINE CULTURE: Culture: 30000 — AB

## 2017-10-13 MED ORDER — INSULIN GLARGINE 100 UNIT/ML ~~LOC~~ SOLN: 30.0000 [IU] | Freq: Every day | SUBCUTANEOUS | 11 refills | Status: AC

## 2017-10-13 MED ORDER — GLUCERNA 1.2 CAL PO LIQD: 1000.0000 mL | ORAL | Status: AC

## 2017-10-13 MED ORDER — AMOXICILLIN-POT CLAVULANATE 875-125 MG PO TABS: 1.0000 | ORAL_TABLET | Freq: Two times a day (BID) | ORAL | Status: AC

## 2017-10-13 MED ORDER — AMOXICILLIN-POT CLAVULANATE 875-125 MG PO TABS
1.0000 | ORAL_TABLET | Freq: Two times a day (BID) | ORAL | Status: DC
  Administered 2017-10-13: 1 via ORAL
  Filled 2017-10-13: qty 1

## 2017-10-13 MED ORDER — ISOSORBIDE MONONITRATE 10 MG PO TABS: 10.0000 mg | ORAL_TABLET | Freq: Two times a day (BID) | ORAL | Status: AC

## 2017-10-13 MED ORDER — AMANTADINE HCL 50 MG/5ML PO SYRP: 200.0000 mg | ORAL_SOLUTION | Freq: Two times a day (BID) | ORAL | 0 refills | Status: AC

## 2017-10-13 MED ORDER — PRO-STAT SUGAR FREE PO LIQD: 60.0000 mL | Freq: Two times a day (BID) | ORAL | 0 refills | Status: AC

## 2017-10-13 MED ORDER — AMANTADINE HCL 50 MG/5ML PO SYRP: 200.0000 mg | ORAL_SOLUTION | Freq: Two times a day (BID) | ORAL | 0 refills | Status: DC

## 2017-10-13 MED ORDER — FREE WATER: 300.0000 mL | Status: AC

## 2017-10-13 MED ORDER — INSULIN ASPART 100 UNIT/ML ~~LOC~~ SOLN: 5.0000 [IU] | SUBCUTANEOUS | 11 refills | Status: AC

## 2017-10-13 MED ORDER — CARVEDILOL 6.25 MG PO TABS: 6.2500 mg | ORAL_TABLET | Freq: Two times a day (BID) | ORAL | Status: AC

## 2017-10-13 MED ORDER — FAMOTIDINE 20 MG PO TABS: 20.0000 mg | ORAL_TABLET | Freq: Two times a day (BID) | ORAL | Status: AC

## 2017-10-13 NOTE — Progress Notes (Signed)
ANTIBIOTIC CONSULT NOTE - INITIAL  Pharmacy Consult for Zosyn Indication: aspiration PNA  Allergies  Allergen Reactions  . Morphine And Related Anaphylaxis    Swelling of the tongue and face    Patient Measurements: Height: 5\' 10"  (177.8 cm) Weight: 252 lb 3.3 oz (114.4 kg) IBW/kg (Calculated) : 73 Adjusted Body Weight:    Vital Signs: Temp: 99.3 F (37.4 C) (06/07 0855) Temp Source: Axillary (06/07 0855) BP: 161/81 (06/07 0855) Pulse Rate: 89 (06/07 0855) Intake/Output from previous day: 06/06 0701 - 06/07 0700 In: 2834.9 [NG/GT:2784.9; IV Piggyback:50] Out: 1153 [Urine:1150; Stool:3] Intake/Output from this shift: No intake/output data recorded.  Labs: Recent Labs    10/11/17 0731 10/12/17 0425 10/13/17 0543  WBC 22.3* 21.1* 15.2*  HGB 14.6 14.2 13.6  PLT 309 288 300  CREATININE 1.17 1.13 1.10   Estimated Creatinine Clearance: 76.9 mL/min (by C-G formula based on SCr of 1.1 mg/dL). No results for input(s): VANCOTROUGH, VANCOPEAK, VANCORANDOM, GENTTROUGH, GENTPEAK, GENTRANDOM, TOBRATROUGH, TOBRAPEAK, TOBRARND, AMIKACINPEAK, AMIKACINTROU, AMIKACIN in the last 72 hours.   Microbiology:   Medical History: Past Medical History:  Diagnosis Date  . Anxiety   . Aortic atherosclerosis (HCC) 08/14/2017  . Arthritis    "knees, elbows, hands" (02/12/2014)  . Compression fracture of L2 (HCC) 03/08/2013  . COPD (chronic obstructive pulmonary disease) (HCC)   . Coronary artery disease    [facility]Nelson[facility]- follows  . Depression   . Esophageal reflux   . Gallstones   . Hyperlipidemia   . Hypertension, benign    Benign  . 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 Myocardial infarction (HCC) 1998 X 2   "probably a couple since 1998" (02/12/2014)  . Palpitations   . Pneumonia ~ 2004 X 1  . Swallowing difficulty    intermittent-occ"doesn't matter with solid or liquid"   Assessment: CC/HPI: Large L basal  ganglia/thalamic hemorrhage with intraventricular extension secondary to HTN  PMH: HTN, DM, CAD, HLD tobacco, anxiety/depression, alcohol  ID: Tmax 99.3. WBC 15.2. Started Zosyn for aspiration PNA. CrCl 77. 6/4 CT consistent with PNA.   6/4 Zosyn>>    Plan:  Continue Zosyn 3.375g IV q 8 hrs.  Shamarion Coots A. Jeanella CrazePierce, PharmD, BCPS Clinical Pharmacist [place] Pager: [phone]   10/13/2017,10:07 AM

## 2017-10-13 NOTE — Progress Notes (Signed)
[facility] Patient is opening eyes spontanous. Global aphasia.  Patient is at baseline.  IV and tube feeing discontinued   Report called to the receiving nurse at the skill nursng facility.  Patient transferred to SNF by PTAR with wife at bedisde.

## 2017-10-13 NOTE — Clinical Social Work Placement (Signed)
Nurse to call report to [phone], Room 224b     CLINICAL SOCIAL WORK PLACEMENT  NOTE  Date:  10/13/2017  Patient Details  Name: Dwayne [name] MRN: [id number] Date of Birth: 07/07/1945  Clinical Social Work is seeking post-discharge placement for this patient at the Skilled  Nursing Facility level of care (*CSW will initial, date and re-position this form in  chart as items are completed):  Yes   Patient/family provided with [place] Clinical Social Work Department's list of facilities offering this level of care within the geographic area requested by the patient (or if unable, by the patient's family).  Yes   Patient/family informed of their freedom to choose among providers that offer the needed level of care, that participate in Medicare, Medicaid or managed care program needed by the patient, have an available bed and are willing to accept the patient.  Yes   Patient/family informed of [facility]'s ownership interest in [facility] and [facility], as well as of the fact that they are under no obligation to receive care at these facilities.  PASRR submitted to EDS on       PASRR number received on       Existing PASRR number confirmed on       FL2 transmitted to all facilities in geographic area requested by pt/family on       FL2 transmitted to all facilities within larger geographic area on       Patient informed that his/her managed care company has contracts with or will negotiate with certain facilities, including the following:        Yes   Patient/family informed of bed offers received.  [facility]     Physician recommends and patient chooses bed at      [facility].  Patient to be transferred to facility by [facility]     Patient family notified on 10/13/17 of transfer.  Name of family member notified:  Dwayne [name]     PHYSICIAN       Additional Comment:     _______________________________________________ Baldemar LenisElizabeth M Roxi Hlavaty, LCSW 10/13/2017, 4:28 PM

## 2017-10-13 NOTE — Care Management Note (Signed)
Case Management Note  Patient Details  Name: Dwayne [name] MRN: [id number] Date of Birth: 09/13/1945  Subjective/Objective:                    Action/Plan: Pt [facility]. CM signing off.   Expected Discharge Date:  10/13/17               Expected Discharge Plan:  Skilled Nursing Facility  In-House Referral:  Clinical Social Work  Discharge planning Services  CM Consult  Post Acute Care Choice:  NA Choice offered to:  NA  DME Arranged:  N/A DME Agency:  NA  HH Arranged:  NA HH Agency:  NA  Status of Service:  Completed, signed off  If discussed at [employer] Length of Stay Meetings, dates discussed:    Additional Comments:  [name], RN 10/13/2017, 3:37 PM

## 2017-10-13 NOTE — Plan of Care (Signed)
  Problem: Education: Goal: Knowledge of General Education information will improve Outcome: Completed/Met   Problem: Health Behavior/Discharge Planning: Goal: Ability to manage health-related needs will improve Outcome: Completed/Met   Problem: Clinical Measurements: Goal: Ability to maintain clinical measurements within normal limits will improve Outcome: Completed/Met Goal: Will remain free from infection Outcome: Completed/Met Goal: Diagnostic test results will improve Outcome: Completed/Met Goal: Respiratory complications will improve Outcome: Completed/Met Goal: Cardiovascular complication will be avoided Outcome: Completed/Met   Problem: Activity: Goal: Risk for activity intolerance will decrease Outcome: Completed/Met   Problem: Nutrition: Goal: Adequate nutrition will be maintained Outcome: Completed/Met   Problem: Pain Managment: Goal: General experience of comfort will improve Outcome: Completed/Met   Problem: Safety: Goal: Ability to remain free from injury will improve Outcome: Completed/Met   Problem: Skin Integrity: Goal: Risk for impaired skin integrity will decrease Outcome: Completed/Met   Problem: Education: Goal: Knowledge of disease or condition will improve Outcome: Completed/Met Goal: Knowledge of secondary prevention will improve Outcome: Completed/Met Goal: Knowledge of patient specific risk factors addressed and post discharge goals established will improve Outcome: Completed/Met   Problem: Coping: Goal: Will verbalize positive feelings about self Outcome: Completed/Met Goal: Will identify appropriate support needs Outcome: Completed/Met   Problem: Health Behavior/Discharge Planning: Goal: Ability to manage health-related needs will improve Outcome: Completed/Met   Problem: Self-Care: Goal: Ability to participate in self-care as condition permits will improve Outcome: Completed/Met Goal: Verbalization of feelings and concerns over  difficulty with self-care will improve Outcome: Completed/Met Goal: Ability to communicate needs accurately will improve Outcome: Completed/Met   Problem: Nutrition: Goal: Risk of aspiration will decrease Outcome: Completed/Met Goal: Dietary intake will improve Outcome: Completed/Met   Problem: Intracerebral Hemorrhage Tissue Perfusion: Goal: Complications of Intracerebral Hemorrhage will be minimized Outcome: Completed/Met

## 2017-10-15 LAB — CULTURE, BLOOD (ROUTINE X 2)
CULTURE: NO GROWTH
Culture: NO GROWTH
SPECIAL REQUESTS: ADEQUATE
Special Requests: ADEQUATE

## 2017-10-18 ENCOUNTER — Encounter (HOSPITAL_COMMUNITY): Payer: Self-pay

## 2017-10-18 ENCOUNTER — Emergency Department (HOSPITAL_COMMUNITY)
Admission: EM | Admit: 2017-10-18 | Discharge: 2017-11-06 | Disposition: E | Payer: Medicare Other | Attending: Emergency Medicine | Admitting: Emergency Medicine

## 2017-10-18 ENCOUNTER — Emergency Department (HOSPITAL_COMMUNITY): Payer: Medicare Other

## 2017-10-18 ENCOUNTER — Other Ambulatory Visit: Payer: Self-pay

## 2017-10-18 DIAGNOSIS — R4182 Altered mental status, unspecified: Secondary | ICD-10-CM | POA: Insufficient documentation

## 2017-10-18 DIAGNOSIS — Z794 Long term (current) use of insulin: Secondary | ICD-10-CM | POA: Insufficient documentation

## 2017-10-18 DIAGNOSIS — F17210 Nicotine dependence, cigarettes, uncomplicated: Secondary | ICD-10-CM | POA: Insufficient documentation

## 2017-10-18 DIAGNOSIS — I10 Essential (primary) hypertension: Secondary | ICD-10-CM | POA: Diagnosis not present

## 2017-10-18 DIAGNOSIS — J9600 Acute respiratory failure, unspecified whether with hypoxia or hypercapnia: Secondary | ICD-10-CM | POA: Diagnosis not present

## 2017-10-18 DIAGNOSIS — Z79899 Other long term (current) drug therapy: Secondary | ICD-10-CM | POA: Insufficient documentation

## 2017-10-18 DIAGNOSIS — E119 Type 2 diabetes mellitus without complications: Secondary | ICD-10-CM | POA: Diagnosis not present

## 2017-10-18 DIAGNOSIS — J449 Chronic obstructive pulmonary disease, unspecified: Secondary | ICD-10-CM | POA: Diagnosis not present

## 2017-10-18 LAB — CBC WITH DIFFERENTIAL/PLATELET
Abs Immature Granulocytes: 0.2 10*3/uL — ABNORMAL HIGH (ref 0.0–0.1)
Basophils Absolute: 0.1 10*3/uL (ref 0.0–0.1)
Basophils Relative: 0 %
EOS ABS: 0 10*3/uL (ref 0.0–0.7)
EOS PCT: 0 %
HEMATOCRIT: 56.6 % — AB (ref 39.0–52.0)
Hemoglobin: 17.6 g/dL — ABNORMAL HIGH (ref 13.0–17.0)
Immature Granulocytes: 1 %
LYMPHS ABS: 3.7 10*3/uL (ref 0.7–4.0)
Lymphocytes Relative: 22 %
MCH: 33 pg (ref 26.0–34.0)
MCHC: 31.1 g/dL (ref 30.0–36.0)
MCV: 106 fL — ABNORMAL HIGH (ref 78.0–100.0)
Monocytes Absolute: 1.5 10*3/uL — ABNORMAL HIGH (ref 0.1–1.0)
Monocytes Relative: 9 %
Neutro Abs: 11.4 10*3/uL — ABNORMAL HIGH (ref 1.7–7.7)
Neutrophils Relative %: 68 %
Platelets: 309 10*3/uL (ref 150–400)
RBC: 5.34 MIL/uL (ref 4.22–5.81)
RDW: 14 % (ref 11.5–15.5)
WBC: 16.8 10*3/uL — ABNORMAL HIGH (ref 4.0–10.5)

## 2017-10-18 LAB — BASIC METABOLIC PANEL
Anion gap: 13 (ref 5–15)
BUN: 82 mg/dL — AB (ref 6–20)
CHLORIDE: 116 mmol/L — AB (ref 101–111)
CO2: 27 mmol/L (ref 22–32)
CREATININE: 2.41 mg/dL — AB (ref 0.61–1.24)
Calcium: 9.5 mg/dL (ref 8.9–10.3)
GFR calc Af Amer: 29 mL/min — ABNORMAL LOW (ref >60)
GFR calc non Af Amer: 25 mL/min — ABNORMAL LOW (ref >60)
GLUCOSE: 343 mg/dL — AB (ref 65–99)
Potassium: 6.2 mmol/L — ABNORMAL HIGH (ref 3.5–5.1)
Sodium: 156 mmol/L — ABNORMAL HIGH (ref 135–145)

## 2017-10-18 LAB — I-STAT ARTERIAL BLOOD GAS, ED
Acid-base deficit: 8 mmol/L — ABNORMAL HIGH (ref 0.0–2.0)
Bicarbonate: 21.5 mmol/L (ref 20.0–28.0)
O2 SAT: 100 %
PCO2 ART: 62.5 mmHg — AB (ref 32.0–48.0)
PH ART: 7.145 — AB (ref 7.350–7.450)
TCO2: 23 mmol/L (ref 22–32)
pO2, Arterial: 347 mmHg — ABNORMAL HIGH (ref 83.0–108.0)

## 2017-10-18 LAB — I-STAT TROPONIN, ED: Troponin i, poc: 0.07 ng/mL (ref 0.00–0.08)

## 2017-10-18 LAB — BRAIN NATRIURETIC PEPTIDE: B NATRIURETIC PEPTIDE 5: 111.4 pg/mL — AB (ref 0.0–100.0)

## 2017-10-18 LAB — I-STAT CG4 LACTIC ACID, ED: LACTIC ACID, VENOUS: 9.56 mmol/L — AB (ref 0.5–1.9)

## 2017-10-18 MED ORDER — SODIUM CHLORIDE 0.9 % IV SOLN
INTRAVENOUS | Status: AC | PRN
  Administered 2017-10-18: 1000 mL via INTRAVENOUS

## 2017-10-18 MED ORDER — ETOMIDATE 2 MG/ML IV SOLN
INTRAVENOUS | Status: AC | PRN
  Administered 2017-10-18: 20 mg via INTRAVENOUS

## 2017-10-18 MED ORDER — SUCCINYLCHOLINE CHLORIDE 20 MG/ML IJ SOLN
INTRAMUSCULAR | Status: AC | PRN
  Administered 2017-10-18: 150 mg via INTRAVENOUS

## 2017-11-06 NOTE — Progress Notes (Signed)
ABG done and resulted. Increased Rate due to low pH and Decreased FiO2 and Peep due to high PaO2

## 2017-11-06 NOTE — Progress Notes (Signed)
Chaplain is very grateful for the empathic care of Nurse Brie, ED Staff and attending physician. Family reported feeling very supported by the inter-disciplinary team. Chaplain came to Pt. & family by way of page from ED. Chaplain escorted family w/ Nurse Brie from ED waiting room to Consultation B where the family was briefed by the attending Physician on the care Pt. Received from ED clinical team and eventual death.  Family was well accepting.  Chaplain offered Ministry of Presence, Comfort, Listening, and Prayer.  Family Present: Pt.'s Spouse: Dwayne [name] and Daughter Dwayne [name].  We all spent time together with deceased. In Trauma A.  Family has received card from Patient Placement Department and will be contacting hospital soon.  Family walked to their vehicle outside of ED.  Grateful, Chaplain Timtohy Broski [employer]

## 2017-11-06 NOTE — Progress Notes (Signed)
Transported to CT and back without issue.

## 2017-11-06 NOTE — ED Notes (Signed)
Patient went into bradycardia then asystolic.  No pulses palpable and no cardiac activity with bedside ultrasound.  Pronounced at 0249 on 15-Dec-2017 by Emily Filbertoug Delo MD.

## 2017-11-06 NOTE — ED Triage Notes (Signed)
Unresponsive found 60 min prior to arrival.  SpO2 80% Prior to EMS. Put on CPAP by EMS and increased to 92%.  Lots of vomit noted on EMS arrival.  Possible aspiration noted.  Agonal breathing on arrival to ED.  MD Delo at bedside preparing to intubate.

## 2017-11-06 NOTE — ED Notes (Signed)
Bad sample per Arlys JohnBrian don't need Chem 8 ran

## 2017-11-06 NOTE — ED Notes (Signed)
Family visiting with patient at this time.  Chaplin present

## 2017-11-06 NOTE — Progress Notes (Signed)
Patient heart stopped and MD declared patient deceased at [id number]. Vent shut off and EtTube remained in place

## 2017-11-06 NOTE — ED Provider Notes (Signed)
[facility] EMERGENCY DEPARTMENT Provider Note   CSN: [id number] Arrival date & time: 10/30/17  0042     History   Chief Complaint Chief Complaint  Patient presents with  . Respiratory Distress    HPI Trong Gosling is a 72 y.o. male.  Patient is a 72 year old male with extensive past medical history including coronary artery disease, COPD, hypertension, diabetes.  He was recently admitted with an intracerebral hemorrhage, then discharged to an extended care facility.  He was found at this facility to be with decreased level of consciousness this evening, then was transported here.  Patient arrived here agonal with bagged respirations.  Patient's gaze is deviated to the right and he has a GCS of 3.  The history is provided by the patient.    Past Medical History:  Diagnosis Date  . Anxiety   . Aortic atherosclerosis (HCC) 08/14/2017  . Arthritis    "knees, elbows, hands" (02/12/2014)  . Compression fracture of L2 (HCC) 03/08/2013  . COPD (chronic obstructive pulmonary disease) (HCC)   . Coronary artery disease    Dr. Delton See, [facility]- follows  . Depression   . Esophageal reflux   . Gallstones   . Hyperlipidemia   . Hypertension, benign    Benign  . 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 Myocardial infarction (HCC) 1998 X 2   "probably a couple since 1998" (02/12/2014)  . Palpitations   . Pneumonia ~ 2004 X 1  . Swallowing difficulty    intermittent-occ"doesn't matter with solid or liquid"    Patient Active Problem List   Diagnosis Date Noted  . Carotid stenosis, bilateral 10/12/2017  . Hypertensive emergency 10/12/2017  . Cigarette smoker 10/12/2017  . Hypernatremia 10/12/2017  . Acute respiratory failure (HCC)   . Leukocytosis   . Dysphagia, post-stroke   . Status post insertion of percutaneous endoscopic gastrostomy (PEG) tube (HCC)   . Chronic obstructive pulmonary disease (HCC)   .  Diastolic dysfunction   . Chronic bilateral low back pain   . Diabetes mellitus type 2 in obese (HCC)   . AKI (acute kidney injury) (HCC)   . 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 Aortic atherosclerosis (HCC) 08/14/2017  . Chronic diastolic CHF (congestive heart failure) (HCC) 10/05/2016  . Mobitz I 10/05/2016  . Dizziness 07/14/2016  . Symptomatic bradycardia 07/14/2016  . Cholelithiasis and cholecystitis without obstruction 03/07/2016  . Diabetic neuropathy (HCC) 07/30/2014  . Adjustment disorder with depressed mood 02/24/2014  . Acid reflux 02/24/2014  . Diabetes mellitus, type 2 (HCC) 02/24/2014  . 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 Abnormal nuclear stress test 02/12/2014  . Hx of class III angina pectoris 02/12/2014  . History of class III angina pectoris 02/12/2014  . Lumbar vertebral fracture (HCC) 03/07/2013  . Angioedema 03/07/2013  . Depression 02/25/2013  . Coronary artery disease involving native coronary artery of native heart without angina pectoris 01/30/2013  . Essential hypertension 01/30/2013  . IDDM (insulin dependent diabetes mellitus) (HCC) 01/30/2013  . Medication refill 01/30/2013  . Chest pain 08/03/2012  . Other reasons for seeking consultation 04/01/2011  . Anxiety 07/27/2010  . History of myocardial infarction 06/29/2010  . Presence of stent in coronary artery 06/29/2010  . Advance directive discussed with patient 06/04/2010  . Blood in the urine 01/17/2006  . HLD (hyperlipidemia) 01/17/2006  .  Awareness of heartbeats 01/17/2006    Past Surgical History:  Procedure Laterality Date  . CARDIAC CATHETERIZATION  ~ 2012  . CATARACT EXTRACTION W/ INTRAOCULAR LENS IMPLANT Right 10/2013  . CHOLECYSTECTOMY N/A 03/07/2016   Procedure: LAPAROSCOPIC CHOLECYSTECTOMY WITH INTRAOPERATIVE CHOLANGIOGRAM;  Surgeon: Glenna Fellows, MD;  Location: [facility] ORS;  Service:  General;  Laterality: N/A;  . CORONARY ANGIOPLASTY  1998  . CORONARY ANGIOPLASTY WITH STENT PLACEMENT  1999; 2014; 02/12/2014   "1; 2; 1"  . ESOPHAGOGASTRODUODENOSCOPY (EGD) WITH PROPOFOL N/A 10/06/2017   Procedure: ESOPHAGOGASTRODUODENOSCOPY (EGD) WITH PROPOFOL;  Surgeon: Jimmye Norman, MD;  Location: [facility] ENDOSCOPY;  Service: General;  Laterality: N/A;  . LEFT HEART CATHETERIZATION WITH CORONARY ANGIOGRAM N/A 02/12/2014   Procedure: LEFT HEART CATHETERIZATION WITH CORONARY ANGIOGRAM;  Surgeon: Lesleigh Noe, MD;  Location: [facility] CATH LAB;  Service: Cardiovascular;  Laterality: N/A;  . PEG PLACEMENT N/A 10/06/2017   Procedure: PERCUTANEOUS ENDOSCOPIC GASTROSTOMY (PEG) PLACEMENT;  Surgeon: Jimmye Norman, MD;  Location: [facility] ENDOSCOPY;  Service: General;  Laterality: N/A;        Home Medications    Prior to Admission medications   Medication Sig Start Date End Date Taking? Authorizing Provider  amantadine (SYMMETREL) 50 MG/5ML solution Take 20 mLs (200 mg total) by mouth 2 (two) times daily. 10/13/17   Layne Benton, NP  Amino Acids-Protein Hydrolys (FEEDING SUPPLEMENT, PRO-STAT SUGAR FREE 64,) LIQD Place 60 mLs into feeding tube 2 (two) times daily. 10/13/17   Layne Benton, NP  amoxicillin-clavulanate (AUGMENTIN) 875-125 MG tablet Take 1 tablet by mouth every 12 (twelve) hours. 10/13/17   Layne Benton, NP  carvedilol (COREG) 6.25 MG tablet Take 1 tablet (6.25 mg total) by mouth 2 (two) times daily with a meal. 10/13/17   Layne Benton, NP  famotidine (PEPCID) 20 MG tablet Take 1 tablet (20 mg total) by mouth 2 (two) times daily. 10/13/17   Layne Benton, NP  GLIPIZIDE XL 10 MG 24 hr tablet Take 10 mg by mouth daily.  02/17/16   [provider]  insulin aspart (NOVOLOG) 100 UNIT/ML injection Inject 5 Units into the skin every 4 (four) hours. 10/13/17   Layne Benton, NP  insulin glargine (LANTUS) 100 UNIT/ML injection Inject 0.3 mLs (30 Units total) into the skin at bedtime. 10/13/17   Layne Benton, NP  isosorbide mononitrate (ISMO,MONOKET) 10 MG tablet Take 1 tablet (10 mg total) by mouth 2 (two) times daily. 10/13/17   Layne Benton, NP  metFORMIN (GLUCOPHAGE) 1000 MG tablet Take 1 tablet (1,000 mg total) by mouth 2 (two) times daily with a meal. 02/16/14   Robbie Lis M, PA-C  Nutritional Supplements (FEEDING SUPPLEMENT, GLUCERNA 1.2 CAL,) LIQD Place 1,000 mLs into feeding tube continuous. 10/13/17   Layne Benton, NP  sertraline (ZOLOFT) 100 MG tablet Take 200 mg by mouth at bedtime.     [provider]  Water For Irrigation, Sterile (FREE WATER) SOLN Place 300 mLs into feeding tube every 3 (three) hours. 10/13/17   Layne Benton, NP    Family History Family History  Problem Relation Age of Onset  . Heart attack Father   . Diabetes Father   . Stroke Neg Hx     Social History Social History   Tobacco Use  . Smoking status: Current Every Day Smoker    Packs/day: 0.50    Years: 50.00    Pack years: 25.00    Types: Cigarettes  . Smokeless tobacco:  Never Used  . Tobacco comment: Counseled on quitting smoking  Substance Use Topics  . Alcohol use: Yes    Alcohol/week: 0.0 oz    Comment: "I was a drinker; I haven't had a drink since 1990"  . Drug use: No     Allergies   Morphine and related   Review of Systems Review of Systems  Unable to perform ROS: Acuity of condition     Physical Exam Updated Vital Signs There were no vitals taken for this visit.  Physical Exam  Constitutional: He appears distressed.  Patient is an acutely on chronically ill-appearing male.  He has agonal respirations and is unresponsive.  GCS is 3.  HENT:  Head: Normocephalic and atraumatic.  Mouth/Throat: Oropharynx is clear and moist.  Eyes:  Gaze is deviated to the right.  Pupils are 4 mm and minimally reactive.  Neck: Normal range of motion. Neck supple.  Cardiovascular: Normal rate and regular rhythm. Exam reveals no friction rub.  No murmur  heard. Pulmonary/Chest: Breath sounds normal. He is in respiratory distress. He has no wheezes. He has no rales.  He is in severe respiratory distress with gurgling breath sounds throughout.  Abdominal: Soft. Bowel sounds are normal. He exhibits no distension. There is no tenderness.  Musculoskeletal: Normal range of motion. He exhibits no edema.  Neurological:  GCS is 3  Skin: Skin is warm and dry. He is not diaphoretic.  Nursing note and vitals reviewed.    ED Treatments / Results  Labs (all labs ordered are listed, but only abnormal results are displayed) Labs Reviewed  BASIC METABOLIC PANEL  CBC WITH DIFFERENTIAL/PLATELET  BRAIN NATRIURETIC PEPTIDE  I-STAT TROPONIN, ED  I-STAT CG4 LACTIC ACID, ED  I-STAT CHEM 8, ED    ED ECG REPORT   Date: 2017-11-03  Rate: 101  Rhythm: sinus tachycardia  QRS Axis: left  Intervals: PR prolonged  ST/T Wave abnormalities: nonspecific T wave changes  Conduction Disutrbances:first-degree A-V block   Narrative Interpretation:   Old EKG Reviewed: none available  I have personally reviewed the EKG tracing and agree with the computerized printout as noted.   Radiology No results found.  Procedures Procedures (including critical care time)  Medications Ordered in ED Medications - No data to display   Initial Impression / Assessment and Plan / ED Course  I have reviewed the triage vital signs and the nursing notes.  Pertinent labs & imaging results that were available during my care of the patient were reviewed by me and considered in my medical decision making (see chart for details).  Patient is a 72 year old male with recent intracerebral hemorrhage.  He was since discharged to an extended care facility where he was found today unresponsive.  He was transported to the emergency department by EMS on CPAP for respiratory support.  He arrived here critically ill, unresponsive, not responding to voice or noxious stimuli, with  rightward eye deviation.  He was placed on BiPAP immediately upon arrival.  He was having difficulty maintaining his saturations and ventilation appeared to be ineffective due to his decline in mental status.    As the patient was showing impending respiratory failure, I made the decision to proceed with intubation.  He was given etomidate and succinylcholine and a 7.5 endotracheal tube was easily placed.  Tube placement was confirmed with direct visualization, end-tidal CO2, and auscultation of the chest and stomach.  Laboratory studies were obtained.  My initial impression was that of a possible recurrent intracerebral hemorrhage, however  this proved not to be the case with CT scan.    I discussed the care with Dr. Arsenio LoaderSommer from critical care and the PCCM staff will evaluate him in the ER.  Shortly after PCCM arrival, the patient became more unstable.  He became bradycardic with a heart rate into the 30s, then developed periods of asystole.  He eventually went to full asystole, then ultimately passed away.  Dr. Merlene PullingHammonds was speaking with the patient's wife simultaneously while the patient died.  She informed the wife.  I have sent an Epic message to the patient's PCP.  CRITICAL CARE Performed by: Geoffery Lyonsouglas Lalonnie Shaffer Total critical care time: 70 minutes Critical care time was exclusive of separately billable procedures and treating other patients. Critical care was necessary to treat or prevent imminent or life-threatening deterioration. Critical care was time spent personally by me on the following activities: development of treatment plan with patient and/or surrogate as well as nursing, discussions with consultants, evaluation of patient's response to treatment, examination of patient, obtaining history from patient or surrogate, ordering and performing treatments and interventions, ordering and review of laboratory studies, ordering and review of radiographic studies, pulse oximetry and re-evaluation of  patient's condition.   INTUBATION Performed by: Geoffery Lyonsouglas Yaneli Keithley  Required items: required blood products, implants, devices, and special equipment available Patient identity confirmed: provided demographic data and hospital-assigned identification number Time out: Immediately prior to procedure a "time out" was called to verify the correct patient, procedure, equipment, support staff and site/side marked as required.  Indications: respiratory failure  Intubation method: Glidescope Laryngoscopy   Preoxygenation: BVM  Sedatives: 20 Etomidate Paralytic: Succinylcholine  Tube Size: 7.5 cuffed  Post-procedure assessment: chest rise and ETCO2 monitor Breath sounds: equal and absent over the epigastrium Tube secured with: ETT holder Chest x-ray interpreted by radiologist and me.  Chest x-ray findings: ndotracheal tube in appropriate position  Patient tolerated the procedure well with no immediate complications.     Final Clinical Impressions(s) / ED Diagnoses   Final diagnoses:  None    ED Discharge Orders    None       Geoffery Lyonselo, Jeorge Reister, MD Jan 04, 2018 [phone]

## 2017-11-06 NOTE — ED Notes (Signed)
Blood for Chem 8 bad sample. Test run x 2. RN informed

## 2017-11-06 DEATH — deceased

## 2019-06-21 IMAGING — CT CT HEAD W/O CM
4 series · 17 of 47 positions shown, 19 images · non-contrast
Comparison: 09/29/2017.

CLINICAL DATA: Follow-up intracranial hemorrhage. RIGHT-sided
weakness. History of hypertension.

EXAM:
CT HEAD WITHOUT CONTRAST
TECHNIQUE: Contiguous axial images were obtained from the base of the skull
through the vertex without intravenous contrast.

[Series 3: head without · axial · non-contrast · 0.42mm/px · z∈[+1426,+1556]mm · 7 of 36 slices shown, 9 images]
[im 5/36  brain]
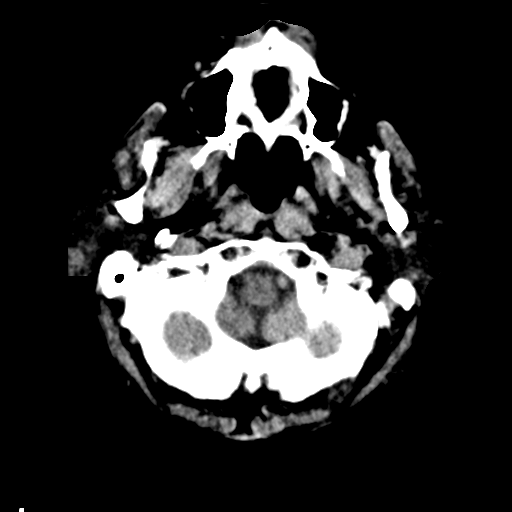
[im 5/36  bone]
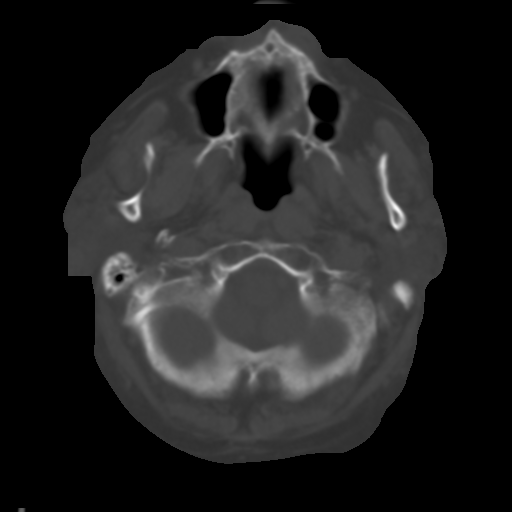
[im 9/36  brain]
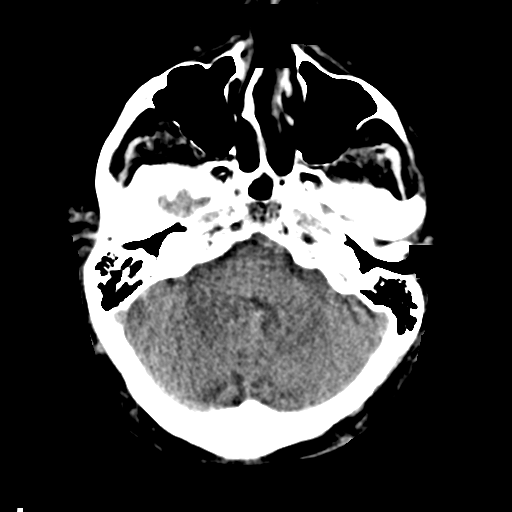
[im 14/36  brain]
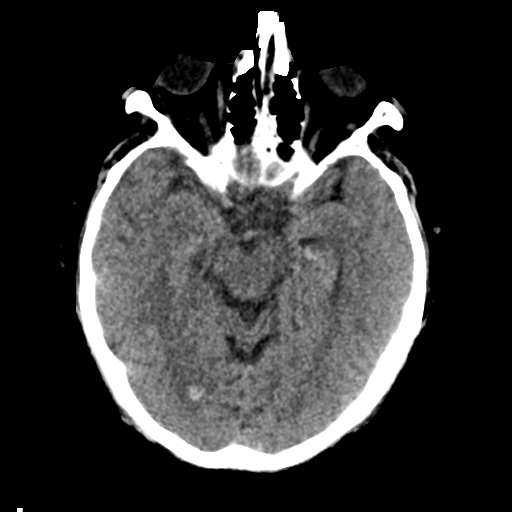
[im 18/36  brain]
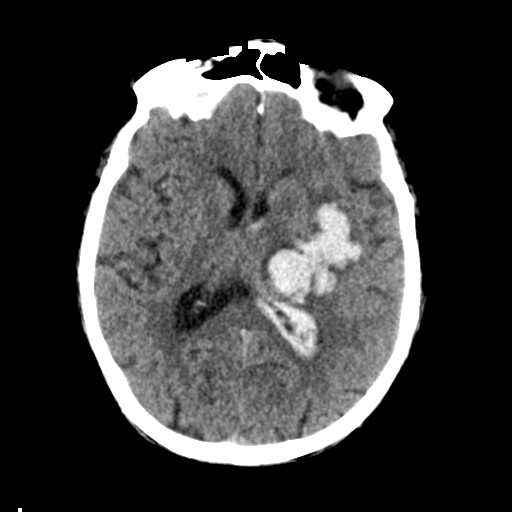
[im 22/36  brain]
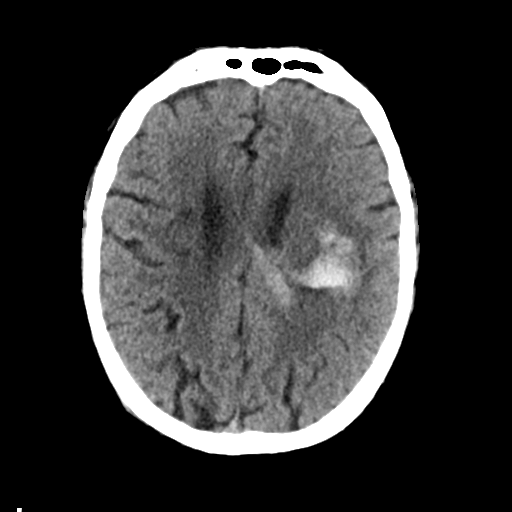
[im 22/36  bone]
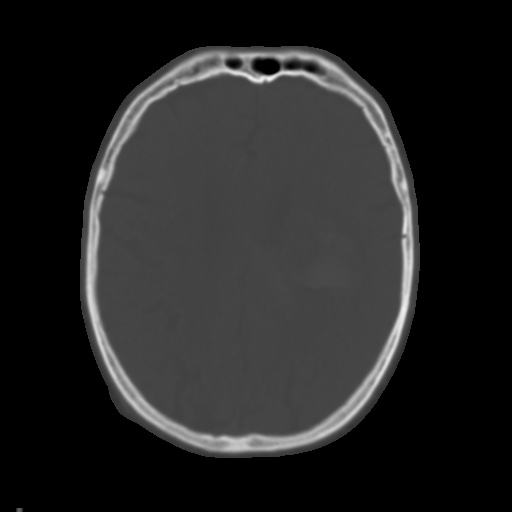
[im 27/36  brain]
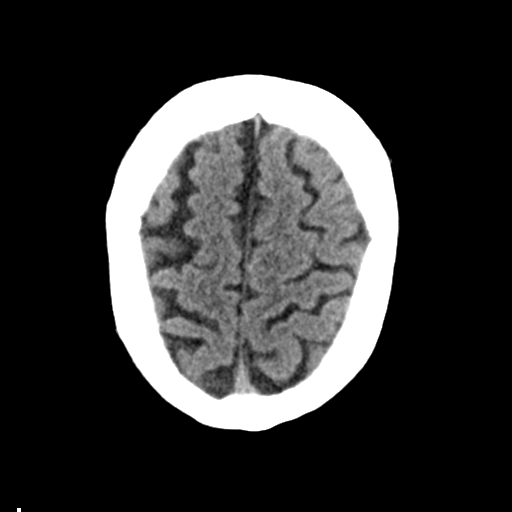
[im 31/36  brain]
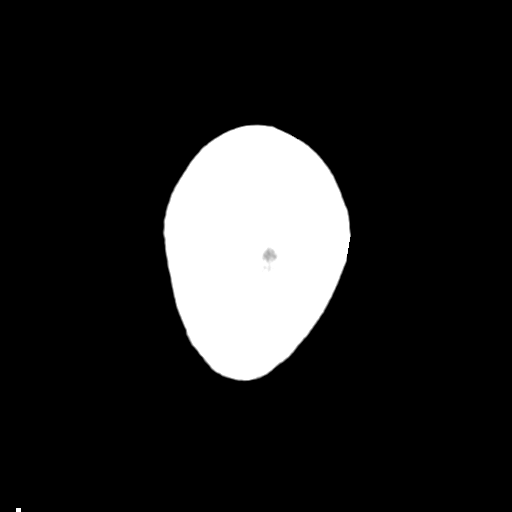

[Series 4: head bone · axial · 0.42mm/px · z∈[+1422,+1484]mm · 4 of 89 slices shown]
[im 9/89  bone]
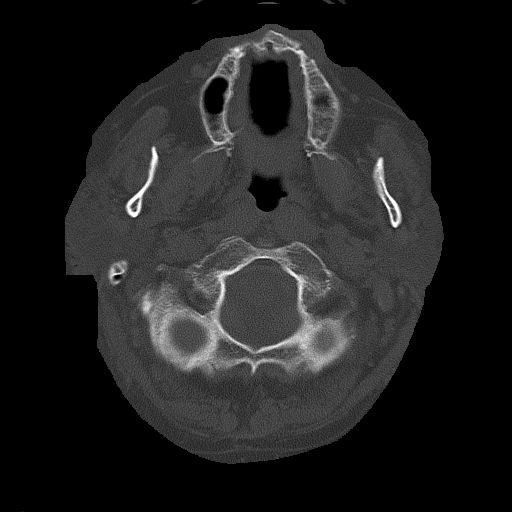
[im 18/89  bone]
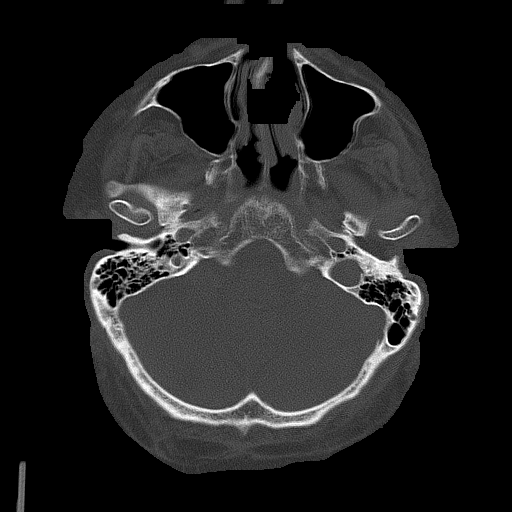
[im 27/89  bone]
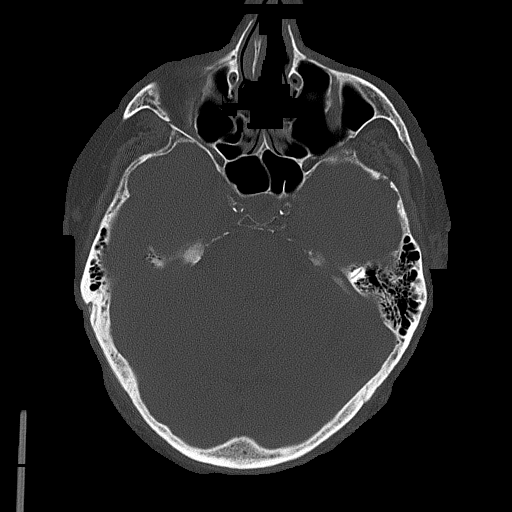
[im 40/89  bone]
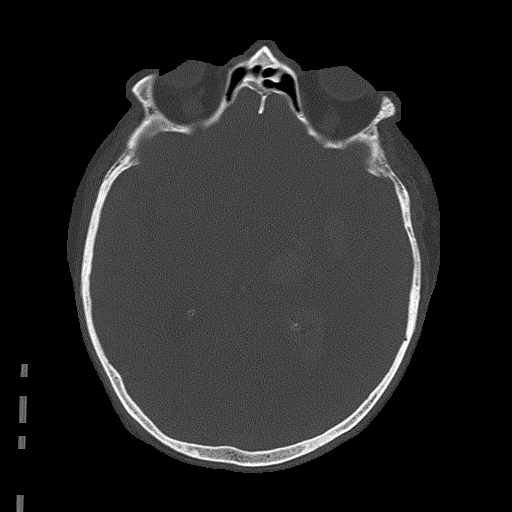

[Series 5: head without cor · coronal · non-contrast · 0.35mm/px · 3 of 67 slices shown]
[im 23/67  brain]
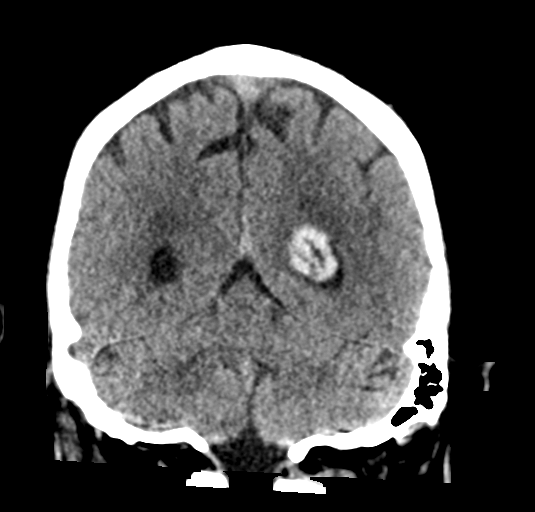
[im 30/67  brain]
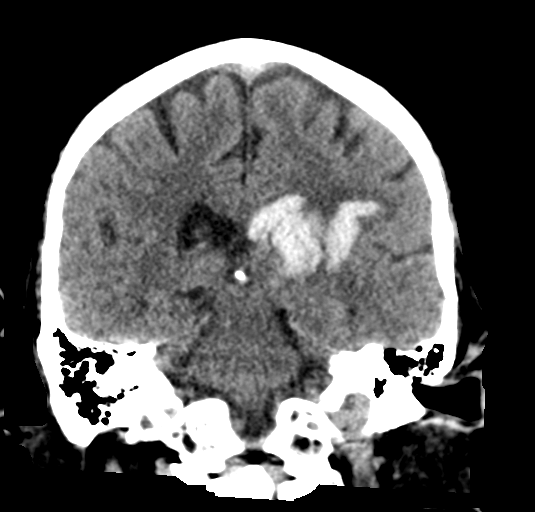
[im 37/67  brain]
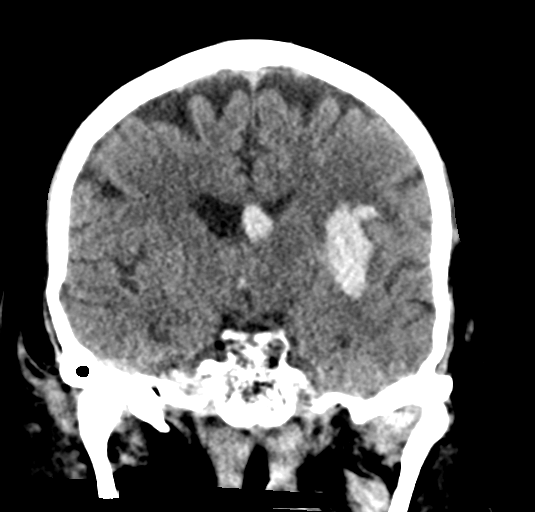

[Series 6: head without sag · sagittal · non-contrast · 0.35mm/px · 3 of 53 slices shown]
[im 18/53  brain]
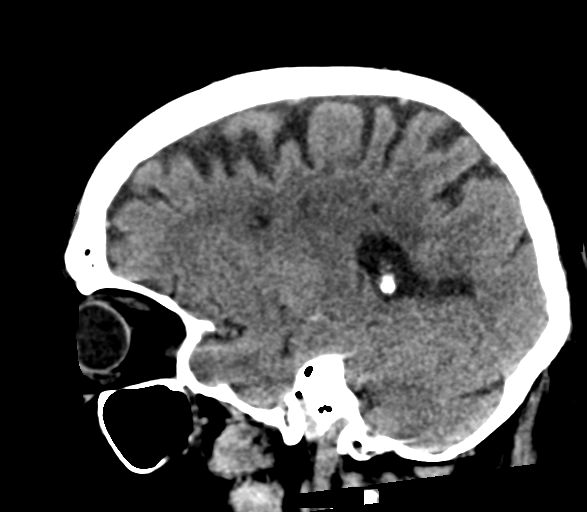
[im 27/53  brain]
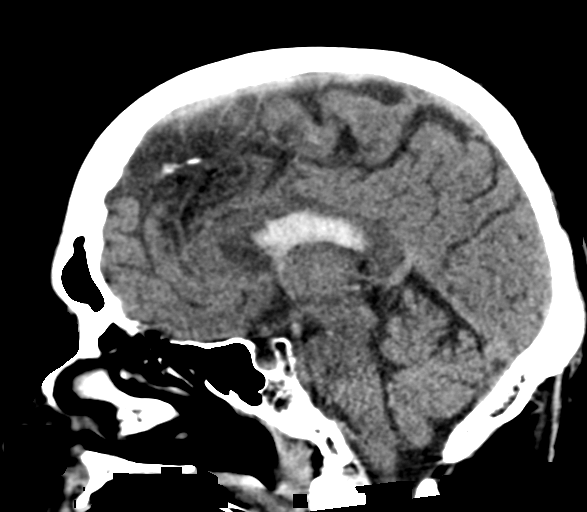
[im 35/53  brain]
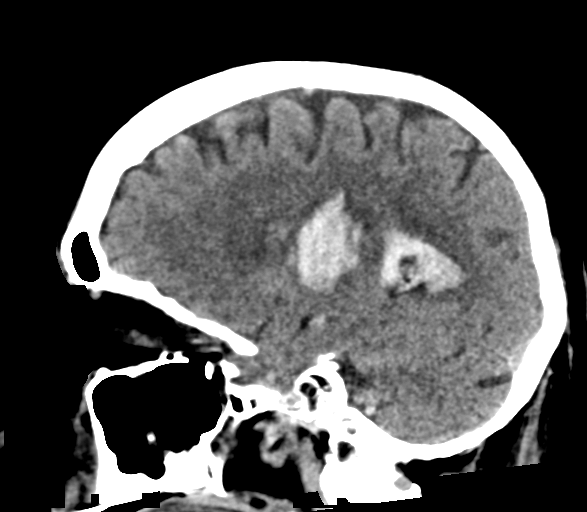

[17 of 47 positions shown; findings below may reference images not displayed]

FINDINGS: Brain: LEFT thalamic/basal ganglia hemorrhage stable to improved.
Mild surrounding edema may be minimally increased but there is no
worsening of LEFT-to-RIGHT shift. Intraventricular hemorrhage
predominantly LEFT-sided, with some redistribution to the RIGHT
lateral ventricle. No hydrocephalus.

Vascular: Calcification of the cavernous internal carotid arteries
consistent with cerebrovascular atherosclerotic disease. No signs of
intracranial large vessel occlusion.

Skull: Calvarium intact.

Sinuses/Orbits: No layering sinus fluid.

Other: Mastoids unremarkable.
IMPRESSION: Essentially stable LEFT thalamic/basal ganglia hemorrhage with
intraventricular extension. No increase in size, worsening shift, or
hydrocephalus.
